# Patient Record
Sex: Male | Born: 1945
Health system: Southern US, Community
[De-identification: ages and names within clinical notes are randomized; demographics above are authoritative.]

## PROBLEM LIST (undated history)

## (undated) DIAGNOSIS — K219 Gastro-esophageal reflux disease without esophagitis: Secondary | ICD-10-CM

## (undated) DIAGNOSIS — Z87442 Personal history of urinary calculi: Secondary | ICD-10-CM

## (undated) DIAGNOSIS — E785 Hyperlipidemia, unspecified: Secondary | ICD-10-CM

## (undated) DIAGNOSIS — Z720 Tobacco use: Secondary | ICD-10-CM

## (undated) DIAGNOSIS — J45909 Unspecified asthma, uncomplicated: Secondary | ICD-10-CM

## (undated) DIAGNOSIS — I2510 Atherosclerotic heart disease of native coronary artery without angina pectoris: Secondary | ICD-10-CM

## (undated) HISTORY — PX: UMBILICAL HERNIA REPAIR: SHX196

## (undated) HISTORY — DX: Atherosclerotic heart disease of native coronary artery without angina pectoris: I25.10

## (undated) HISTORY — PX: KIDNEY STONE SURGERY: SHX686

## (undated) HISTORY — DX: Tobacco use: Z72.0

## (undated) HISTORY — DX: Hyperlipidemia, unspecified: E78.5

---

## 1990-05-29 HISTORY — PX: CORONARY ANGIOPLASTY: SHX604

## 1990-06-28 HISTORY — PX: CORONARY ANGIOPLASTY: SHX604

## 1999-12-03 ENCOUNTER — Encounter: Payer: Self-pay | Admitting: Emergency Medicine

## 1999-12-03 ENCOUNTER — Emergency Department (HOSPITAL_COMMUNITY): Admission: EM | Admit: 1999-12-03 | Discharge: 1999-12-03 | Payer: Self-pay | Admitting: Emergency Medicine

## 2001-04-25 ENCOUNTER — Emergency Department (HOSPITAL_COMMUNITY): Admission: EM | Admit: 2001-04-25 | Discharge: 2001-04-26 | Payer: Self-pay | Admitting: *Deleted

## 2001-04-25 ENCOUNTER — Encounter: Payer: Self-pay | Admitting: Emergency Medicine

## 2001-07-14 ENCOUNTER — Ambulatory Visit (HOSPITAL_COMMUNITY): Admission: RE | Admit: 2001-07-14 | Discharge: 2001-07-14 | Payer: Self-pay | Admitting: Gastroenterology

## 2001-11-03 ENCOUNTER — Ambulatory Visit (HOSPITAL_COMMUNITY): Admission: RE | Admit: 2001-11-03 | Discharge: 2001-11-03 | Payer: Self-pay | Admitting: Gastroenterology

## 2006-01-20 ENCOUNTER — Encounter: Admission: RE | Admit: 2006-01-20 | Discharge: 2006-01-20 | Payer: Self-pay | Admitting: Surgery

## 2006-01-22 ENCOUNTER — Ambulatory Visit (HOSPITAL_BASED_OUTPATIENT_CLINIC_OR_DEPARTMENT_OTHER): Admission: RE | Admit: 2006-01-22 | Discharge: 2006-01-22 | Payer: Self-pay | Admitting: Surgery

## 2009-11-06 ENCOUNTER — Encounter: Admission: RE | Admit: 2009-11-06 | Discharge: 2009-11-06 | Payer: Self-pay | Admitting: Cardiology

## 2009-11-12 ENCOUNTER — Inpatient Hospital Stay (HOSPITAL_BASED_OUTPATIENT_CLINIC_OR_DEPARTMENT_OTHER): Admission: RE | Admit: 2009-11-12 | Discharge: 2009-11-12 | Payer: Self-pay | Admitting: Cardiology

## 2010-05-27 ENCOUNTER — Ambulatory Visit: Payer: Self-pay | Admitting: Cardiology

## 2010-12-09 ENCOUNTER — Ambulatory Visit: Payer: Self-pay | Admitting: Cardiology

## 2010-12-19 LAB — POCT I-STAT GLUCOSE
Glucose, Bld: 167 mg/dL — ABNORMAL HIGH (ref 70–99)
Operator id: 221371

## 2011-01-01 ENCOUNTER — Other Ambulatory Visit: Payer: Self-pay | Admitting: Cardiology

## 2011-01-01 DIAGNOSIS — I2510 Atherosclerotic heart disease of native coronary artery without angina pectoris: Secondary | ICD-10-CM

## 2011-01-01 MED ORDER — RANOLAZINE ER 500 MG PO TB12: 500.0000 mg | ORAL_TABLET | Freq: Every day | ORAL | Status: DC

## 2011-01-01 NOTE — Telephone Encounter (Signed)
Fax received from pharmacy. Dr Swaziland consulted to verify dose and auth as ordered. Alfonso Ramus RN

## 2011-01-05 ENCOUNTER — Other Ambulatory Visit: Payer: Self-pay | Admitting: Cardiology

## 2011-01-05 ENCOUNTER — Other Ambulatory Visit: Payer: Self-pay | Admitting: *Deleted

## 2011-01-05 DIAGNOSIS — I209 Angina pectoris, unspecified: Secondary | ICD-10-CM

## 2011-01-05 DIAGNOSIS — I2510 Atherosclerotic heart disease of native coronary artery without angina pectoris: Secondary | ICD-10-CM

## 2011-01-05 MED ORDER — RANOLAZINE ER 500 MG PO TB12: 500.0000 mg | ORAL_TABLET | Freq: Every day | ORAL | Status: DC

## 2011-01-05 MED ORDER — RANOLAZINE ER 500 MG PO TB12: 500.0000 mg | ORAL_TABLET | Freq: Two times a day (BID) | ORAL | Status: DC

## 2011-01-05 NOTE — Telephone Encounter (Signed)
Ranexa 1000mg  refill. [facility] pharmacy called several times but office has not responded. Call back [facility] [place] [phone])

## 2011-01-05 NOTE — Telephone Encounter (Signed)
escribe medication per fax request  

## 2011-01-12 ENCOUNTER — Ambulatory Visit: Payer: Self-pay | Admitting: Cardiology

## 2011-01-22 ENCOUNTER — Encounter: Payer: Self-pay | Admitting: Cardiology

## 2011-01-22 DIAGNOSIS — I2510 Atherosclerotic heart disease of native coronary artery without angina pectoris: Secondary | ICD-10-CM | POA: Insufficient documentation

## 2011-01-22 DIAGNOSIS — E785 Hyperlipidemia, unspecified: Secondary | ICD-10-CM | POA: Insufficient documentation

## 2011-01-22 DIAGNOSIS — N200 Calculus of kidney: Secondary | ICD-10-CM | POA: Insufficient documentation

## 2011-01-22 DIAGNOSIS — E119 Type 2 diabetes mellitus without complications: Secondary | ICD-10-CM | POA: Insufficient documentation

## 2011-01-22 DIAGNOSIS — Z720 Tobacco use: Secondary | ICD-10-CM | POA: Insufficient documentation

## 2011-01-28 ENCOUNTER — Ambulatory Visit (INDEPENDENT_AMBULATORY_CARE_PROVIDER_SITE_OTHER): Payer: Medicare Other | Admitting: Cardiology

## 2011-01-28 ENCOUNTER — Encounter: Payer: Self-pay | Admitting: Cardiology

## 2011-01-28 DIAGNOSIS — E7800 Pure hypercholesterolemia, unspecified: Secondary | ICD-10-CM

## 2011-01-28 DIAGNOSIS — Z720 Tobacco use: Secondary | ICD-10-CM

## 2011-01-28 DIAGNOSIS — E785 Hyperlipidemia, unspecified: Secondary | ICD-10-CM

## 2011-01-28 DIAGNOSIS — F17200 Nicotine dependence, unspecified, uncomplicated: Secondary | ICD-10-CM

## 2011-01-28 DIAGNOSIS — I2510 Atherosclerotic heart disease of native coronary artery without angina pectoris: Secondary | ICD-10-CM

## 2011-01-28 MED ORDER — ATORVASTATIN CALCIUM 80 MG PO TABS
80.0000 mg | ORAL_TABLET | Freq: Every day | ORAL | Status: DC
Start: 2011-01-28 — End: 2014-07-05

## 2011-01-28 MED ORDER — NITROGLYCERIN 0.4 MG SL SUBL: 0.4000 mg | SUBLINGUAL_TABLET | SUBLINGUAL | Status: DC | PRN

## 2011-01-28 NOTE — Assessment & Plan Note (Signed)
I will request a copy of his most recent blood work. Since Vytorin is too expensive we will switch him to Lipitor 80 mg daily.

## 2011-01-28 NOTE — Assessment & Plan Note (Signed)
I have counseled him on the need for smoking cessation. He is not interested in quitting at this time.

## 2011-01-28 NOTE — Assessment & Plan Note (Signed)
We will continue medical therapy. His angina is well controlled. He needs to continue on risk factor modification including smoking cessation, lipid control, and weight loss.

## 2011-01-28 NOTE — Progress Notes (Signed)
Benjamin [name] Date of Birth: 1946/02/20   History of Present Illness: Benjamin [name] is seen for followup today. He states he is doing very well. He reports that he had a screening ultrasound exam last month and that everything checked out okay. He complains that he is unable to afford the Vytorin. He reports that Dr. Clovis Riley does blood work on him every 6 months. He denies any significant chest pain or shortness of breath. He's had no palpitations.  Current Outpatient Prescriptions on File Prior to Visit  Medication Sig Dispense Refill  . aspirin 325 MG tablet Take 325 mg by mouth daily.        Marland Kitchen atenolol (TENORMIN) 25 MG tablet Take 25 mg by mouth daily.        Marland Kitchen glimepiride (AMARYL) 4 MG tablet Take 4 mg by mouth daily before breakfast.        . isosorbide mononitrate (IMDUR) 60 MG 24 hr tablet Take 60 mg by mouth daily.        Marland Kitchen lisinopril (PRINIVIL,ZESTRIL) 20 MG tablet Take 20 mg by mouth daily.        . metFORMIN (GLUMETZA) 1000 MG (MOD) 24 hr tablet Take 1,000 mg by mouth 2 (two) times daily with a meal.        . ranolazine (RANEXA) 500 MG 12 hr tablet Take 1 tablet (500 mg total) by mouth daily.  30 tablet  5  . VITAMIN A PO Take by mouth daily.        Marland Kitchen VITAMIN E PO Take by mouth daily.        Marland Kitchen DISCONTD: ezetimibe-simvastatin (VYTORIN) 10-80 MG per tablet Take 1 tablet by mouth at bedtime.        Marland Kitchen DISCONTD: nitroGLYCERIN (NITROSTAT) 0.4 MG SL tablet Place 0.4 mg under the tongue every 5 (five) minutes as needed.         Allergies  Allergen Reactions  . Sulfa Drugs Cross Reactors     Past Medical History  Diagnosis Date  . Coronary artery disease   . Diabetes mellitus     TYPE 2  . Hyperlipidemia   . Kidney stones   . Tobacco abuse     Past Surgical History  Procedure Date  . Coronary angioplasty 05/1990    PROXIMAL LAD  . Coronary angioplasty 06/1990    LEFT CIRCUMFLEX CORONARY  . Umbilical hernia repair     History  Smoking status  . Current Everyday Smoker  -- 1.0 packs/day for 54 years  . Types: Cigarettes  Smokeless tobacco  . Never Used    History  Alcohol Use  . Yes    every other week    Family History  Problem Relation Age of Onset  . Heart disease Sister     Review of Systems: The review of systems is positive for reduced alcohol intake.  He states he now only drinks when [place] which is only once every couple of weeks. He continues to smoke.All other systems were reviewed and are negative.  Physical Exam: BP 100/60  Pulse 76  Ht 5\' 5"  (1.651 m)  Wt 159 lb 9 oz (72.377 kg)  BMI 26.55 kg/m2 He is a pleasant white male in no acute distress. HEENT exam is unremarkable. No JVD or bruits. Lungs are clear. Cardiac exam reveals a regular rate and rhythm without gallop, murmur, or click. He has good pedal pulses. He has no edema. Neurologic exam is nonfocal.  LABORATORY DATA:  Assessment / Plan:

## 2011-01-28 NOTE — Patient Instructions (Addendum)
We will get a copy of your lab work from Dr. Clovis Riley  We will substitute Lipitor 80 mg daily for Vytorin.  Stay active and try and lose weight.  Stop smoking.  We will see you back in 6 months.

## 2011-02-13 NOTE — Procedures (Signed)
[place]. [facility]  Patient:    Benjamin [name], Benjamin [name]. Visit Number: [id number] MRN: [id number]          Service Type: Attending:  Verlin Grills, M.D. Dictated by:   Verlin Grills, M.D. Proc. Date: 11/03/01   CC:         Lupe Carney, M.D.   Procedure Report  REFERRING PHYSICIAN:  Lupe Carney, M.D.  PROCEDURE INDICATION: Mr. Benjamin [name] is a 65 year old male born December 29, 1945.  Mr. Benjamin [name] is referred to me for diagnostic colonoscopy to evaluate guaiac-positive stool.  I discussed with Mr. Benjamin [name] the complications associated with colonoscopy and polypectomy, including a 15 per 1,000 risk of bleeding and 4 per 1,000 risk of colonic perforation requiring surgical repair.  Mr. Benjamin [name] has signed the operative permit.  ENDOSCOPIST:  Verlin Grills, M.D.  PREMEDICATION:  Versed 7.5 mg, fentanyl 50 mcg.  ENDOSCOPE:  Olympus pediatric colonoscope.  PROCEDURE:  After obtaining informed consent, Mr. Benjamin [name] was placed in the left lateral decubitus position.  I administered intravenous fentanyl and intravenous Versed to achieve conscious sedation before the procedure.  The patients blood pressure, oxygen saturation, and cardiac rhythm were monitored throughout the procedure and documented in the medical record.  Anal inspection was normal.  Digital rectal exam revealed a small, non-nodular prostate.  The Olympus pediatric video colonoscope was introduced into the rectum and easily advanced to the cecum.  The normal-appearing ileocecal valve was intubated and the distal ileum inspected.  Colonic preparation for the exam today was excellent.  Rectum: Normal.  Sigmoid and descending colon:  Normal.  Splenic flexure:  Normal.  Transverse colon:  Normal.  Hepatic flexure:  Normal.  Ascending colon:  Normal.  Cecum and ileocecal valve:  Normal.  Distal ileum:  Normal.  ASSESSMENT: Normal proctocolonoscopy to the cecum  with intubation of a normal-appearing ileocecal valve and distal ileal inspection.  No endoscopic evidence for the presence of colorectal neoplasia or inflammatory bowel disease. Dictated by:   Verlin Grills, M.D. Attending:  Verlin Grills, M.D. DD:  11/03/01 TD:  11/04/01 Job: [id number]

## 2011-02-13 NOTE — Op Note (Signed)
NAME:  Benjamin [name], Benjamin [name] NO.:  [id number]   MEDICAL RECORD NO.:  [id number]          PATIENT TYPE:  AMB   LOCATION:  [facility]                         FACILITY:  [facility]   PHYSICIAN:  Thomas A. Cornett, M.D.DATE OF BIRTH:  Jun 28, 1946   DATE OF PROCEDURE:  01/22/2006  DATE OF DISCHARGE:                                 OPERATIVE REPORT   PREOPERATIVE DIAGNOSIS:  Umbilical hernia.   POSTOPERATIVE DIAGNOSIS:  Umbilical hernia.   PROCEDURE:  Umbilical hernia repair.   SURGEON:  Maisie Fus A. Cornett, M.D.   ANESTHESIA:  LMA with 9 cc of 0.5% Sensorcaine local with epinephrine.   ESTIMATED BLOOD LOSS:  Less than 10 cc.   DRAINS:  None.   SPECIMENS:  None.   INDICATIONS FOR PROCEDURE:  The patient is a 65 year old male with a  symptomatic umbilical hernia.  He wished to have this repaired.  I discussed  the procedure with him at length, especially any complications of the  procedure, which include bleeding, infection, injury to internal organs, and  recurrence as the most common complications.  He understood the above and  agreed to proceed.   DESCRIPTION OF PROCEDURE:  The patient is brought to the operating room and  placed supine.  After induction of LMA anesthesia, the periumbilical region  was prepped and draped in a sterile fashion.  An infraumbilical incision was  made after infiltrating 0.5% Bupivacaine around the umbilicus.  The incision  was carried below the umbilicus, where I encountered the hernia sac and  dissected it off the under-surface of the skin at the umbilicus.  This had  nothing in it, and this was all preperitoneal contents.  I reduced this back  into the space below the fascia.  I did not penetrate the peritoneal lining  into the abdominal cavity.  The defect measured less than 1 cm in size, and  I opted to repair it with two interrupted 0 Novofil sutures.  This worked  quite nicely and closed the defect.  I then tacked the umbilicus down to the  fascia with a 3-0 Vicryl, and 4-0  Monocryl was then used to close the skin.  All final counts of sponge,  needle, and instruments were found to be correct at this portion of the  case.  The patient was awakened and taken to recovery in satisfactory  condition.      Thomas A. Cornett, M.D.  Electronically Signed     TAC/MEDQ  D:  01/22/2006  T:  01/22/2006  Job:  [id number]   cc:   L. Lupe Carney, M.D.  Fax: [phone]

## 2011-07-22 ENCOUNTER — Encounter: Payer: Self-pay | Admitting: Nurse Practitioner

## 2011-07-22 ENCOUNTER — Ambulatory Visit (INDEPENDENT_AMBULATORY_CARE_PROVIDER_SITE_OTHER): Payer: Medicare Other | Admitting: Nurse Practitioner

## 2011-07-22 VITALS — BP 98/58 | HR 88 | Ht 65.0 in | Wt 157.0 lb

## 2011-07-22 DIAGNOSIS — I2510 Atherosclerotic heart disease of native coronary artery without angina pectoris: Secondary | ICD-10-CM

## 2011-07-22 DIAGNOSIS — E785 Hyperlipidemia, unspecified: Secondary | ICD-10-CM

## 2011-07-22 DIAGNOSIS — Z720 Tobacco use: Secondary | ICD-10-CM

## 2011-07-22 DIAGNOSIS — F17200 Nicotine dependence, unspecified, uncomplicated: Secondary | ICD-10-CM

## 2011-07-22 NOTE — Assessment & Plan Note (Signed)
He is doing well clinically. No change in his medicines. Will see him back in 6 months. Smoking cessation is encouraged once again. Patient is agreeable to this plan and will call if any problems develop in the interim.

## 2011-07-22 NOTE — Assessment & Plan Note (Signed)
Now on Lipitor. Was not able to afford Vytorin. Has had labs drawn at his PCP's earlier today.

## 2011-07-22 NOTE — Patient Instructions (Signed)
Stay on your current medicines  Smoking cessation is encouraged.  We will see you back in 6 months.  Call for any problems.

## 2011-07-22 NOTE — Assessment & Plan Note (Addendum)
Smoking cessation is encouraged. He is not interested in stopping.

## 2011-07-22 NOTE — Progress Notes (Signed)
Benjamin [name] Date of Birth: Aug 20, 1946 Medical Record #[id number]  History of Present Illness: Benjamin [name] is seen today for his follow up visit. He is seen for Dr. Swaziland. He has known CAD and diabetes with ongoing tobacco use. [place]probably too much[place] He is not interested in quitting. Last myoview in January of 2011. He says he is doing well. No chest pain or shortness of breath. He is not lightheaded or dizzy. No palpitations. He has had his labs drawn earlier this morning. He has no complaint.  Current Outpatient Prescriptions on File Prior to Visit  Medication Sig Dispense Refill  . aspirin 325 MG tablet Take 325 mg by mouth daily.        Marland Kitchen atenolol (TENORMIN) 25 MG tablet Take 25 mg by mouth daily.        Marland Kitchen atorvastatin (LIPITOR) 80 MG tablet Take 1 tablet (80 mg total) by mouth daily.  90 tablet  3  . glimepiride (AMARYL) 4 MG tablet Take 4 mg by mouth daily before breakfast.        . isosorbide mononitrate (IMDUR) 60 MG 24 hr tablet Take 60 mg by mouth daily.        Marland Kitchen lisinopril (PRINIVIL,ZESTRIL) 20 MG tablet Take 20 mg by mouth daily.        . metFORMIN (GLUMETZA) 1000 MG (MOD) 24 hr tablet Take 1,000 mg by mouth 2 (two) times daily with a meal.        . nitroGLYCERIN (NITROSTAT) 0.4 MG SL tablet Place 1 tablet (0.4 mg total) under the tongue every 5 (five) minutes as needed.  25 tablet  12  . ranolazine (RANEXA) 500 MG 12 hr tablet Take 1 tablet (500 mg total) by mouth daily.  30 tablet  5    Allergies  Allergen Reactions  . Sulfa Drugs Cross Reactors     Past Medical History  Diagnosis Date  . Coronary artery disease   . Diabetes mellitus     TYPE 2  . Hyperlipidemia   . Kidney stones   . Tobacco abuse     Past Surgical History  Procedure Date  . Coronary angioplasty 05/1990    PROXIMAL LAD  . Coronary angioplasty 06/1990    LEFT CIRCUMFLEX CORONARY  . Umbilical hernia repair     History  Smoking status  . Current Everyday  Smoker -- 1.0 packs/day for 54 years  . Types: Cigarettes  Smokeless tobacco  . Never Used    History  Alcohol Use  . Yes    every other week    Family History  Problem Relation Age of Onset  . Heart disease Sister     had open heart surgery    Review of Systems: The review of systems is positive for mild joint pain. He says he is tolerating his medicines.  All other systems were reviewed and are negative.  Physical Exam: BP 98/58  Pulse 88  Ht 5\' 5"  (1.651 m)  Wt 157 lb (71.215 kg)  BMI 26.13 kg/m2 Patient is very pleasant and in no acute distress. He smells of tobacco. Skin is warm and dry. Color is normal.  HEENT is unremarkable. Normocephalic/atraumatic. PERRL. Sclera are nonicteric. Neck is supple. No masses. No JVD. Lungs are clear. Cardiac exam shows a regular rate and rhythm. He does have some occasional ectopics. Abdomen is soft. Extremities are without edema. Gait and ROM are intact. No gross neurologic deficits noted.  LABORATORY DATA: EKG shows sinus with nonspecific ST changes.   Assessment / Plan:

## 2011-07-28 ENCOUNTER — Encounter: Payer: Self-pay | Admitting: Cardiology

## 2011-12-02 ENCOUNTER — Telehealth: Payer: Self-pay | Admitting: Cardiology

## 2011-12-02 NOTE — Telephone Encounter (Signed)
Pt uses [facility], pt on renexa and ins will not longer cover it, can he change to something else? pls call

## 2011-12-02 NOTE — Telephone Encounter (Signed)
Patient called stating insurance has denied ranexa.Ranexa drug assistance program was called [phone] will be faxing application for free ranexa.

## 2011-12-17 ENCOUNTER — Telehealth: Payer: Self-pay | Admitting: Cardiology

## 2011-12-17 NOTE — Telephone Encounter (Signed)
Please return call to patient assistance program case manager   Roddie Mc- Ranexa [facility] [phone]  Please contact Roddie Mc concerning prior authorization for RX Ranexa, she can be reached at [phone]

## 2011-12-18 NOTE — Telephone Encounter (Signed)
Benjamin [name] from ranexa connect called no answer.LMTC.

## 2011-12-21 NOTE — Telephone Encounter (Signed)
FU Call: Pt returning call to [facility]. Please return pt call to discuss further.

## 2011-12-21 NOTE — Telephone Encounter (Signed)
Patient called, stated he has not been approved for ranexa yet .Ranexa connect called, they will fax re application form for Dr.Signature.

## 2011-12-24 NOTE — Telephone Encounter (Signed)
Re application form was filled out and faxed [phone].

## 2012-01-18 ENCOUNTER — Ambulatory Visit (INDEPENDENT_AMBULATORY_CARE_PROVIDER_SITE_OTHER): Payer: Medicare Other | Admitting: Cardiology

## 2012-01-18 ENCOUNTER — Encounter: Payer: Self-pay | Admitting: Cardiology

## 2012-01-18 VITALS — BP 100/60 | HR 56 | Ht 65.0 in | Wt 156.0 lb

## 2012-01-18 DIAGNOSIS — Z720 Tobacco use: Secondary | ICD-10-CM

## 2012-01-18 DIAGNOSIS — E119 Type 2 diabetes mellitus without complications: Secondary | ICD-10-CM

## 2012-01-18 DIAGNOSIS — I2510 Atherosclerotic heart disease of native coronary artery without angina pectoris: Secondary | ICD-10-CM

## 2012-01-18 DIAGNOSIS — F17200 Nicotine dependence, unspecified, uncomplicated: Secondary | ICD-10-CM

## 2012-01-18 NOTE — Assessment & Plan Note (Signed)
His chronic angina is consistent with his known coronary disease and occlusion of the right coronary with collaterals. We will continue his medical management with aspirin, beta blocker, isosorbide, and Ranexa. He needs to focus more risk factor modification with smoking cessation.

## 2012-01-18 NOTE — Progress Notes (Signed)
Benjamin [name] Date of Birth: [birth year] Medical Record #[id number]  History of Present Illness: Benjamin [name] is seen today for his follow up visit. He has been under a lot of stress recently as his wife was recently admitted with serious cardiac disease. He states he's not been eating as well and has lost some weight. He is trying to smoke less but continues to smoke at least one half pack per day. He does note that when he does something strenuous he gets a burning sensation in his chest that is resolved with rest. He has not used any nitroglycerin.  Current Outpatient Prescriptions on File Prior to Visit  Medication Sig Dispense Refill  . aspirin 325 MG tablet Take 325 mg by mouth daily.        Marland Kitchen atenolol (TENORMIN) 25 MG tablet Take 25 mg by mouth daily.        Marland Kitchen atorvastatin (LIPITOR) 80 MG tablet Take 1 tablet (80 mg total) by mouth daily.  90 tablet  3  . glimepiride (AMARYL) 4 MG tablet Take 4 mg by mouth daily before breakfast.        . isosorbide mononitrate (IMDUR) 60 MG 24 hr tablet Take 60 mg by mouth daily.        Marland Kitchen lisinopril (PRINIVIL,ZESTRIL) 20 MG tablet Take 20 mg by mouth daily.        . metFORMIN (GLUMETZA) 1000 MG (MOD) 24 hr tablet Take 1,000 mg by mouth 2 (two) times daily with a meal.        . Multiple Vitamin (MULTIVITAMIN) tablet Take 1 tablet by mouth daily.        . nitroGLYCERIN (NITROSTAT) 0.4 MG SL tablet Place 1 tablet (0.4 mg total) under the tongue every 5 (five) minutes as needed.  25 tablet  12  . ranolazine (RANEXA) 500 MG 12 hr tablet Take 1 tablet (500 mg total) by mouth daily.  30 tablet  5    Allergies  Allergen Reactions  . Sulfa Drugs Cross Reactors     Past Medical History  Diagnosis Date  . Coronary artery disease     Prior PCI to the prox LAD in 1991; S/P PCI to LCX in October 1991  . Diabetes mellitus     TYPE 2  . Hyperlipidemia   . Kidney stones   . Tobacco abuse     Past Surgical History  Procedure Date  . Coronary angioplasty  05/1990    PROXIMAL LAD  . Coronary angioplasty 06/1990    LEFT CIRCUMFLEX CORONARY  . Umbilical hernia repair     History  Smoking status  . Current Everyday Smoker -- 1.0 packs/day for 54 years  . Types: Cigarettes  Smokeless tobacco  . Never Used    History  Alcohol Use  . Yes    every other week    Family History  Problem Relation Age of Onset  . Heart disease Sister     had open heart surgery    Review of Systems: The review of systems is positive for chronic angina as noted.  All other systems were reviewed and are negative.  Physical Exam: BP 100/60  Pulse 56  Ht 5\' 5"  (1.651 m)  Wt 156 lb (70.761 kg)  BMI 25.96 kg/m2 Patient is very pleasant and in no acute distress. Skin is warm and dry. Color is normal.  HEENT is unremarkable. Normocephalic/atraumatic. PERRL. Sclera are nonicteric. Neck is supple. No masses. No JVD. Lungs are clear. Cardiac exam shows  a regular rate and rhythm. He does have some occasional ectopics. Abdomen is soft. There are no masses or bruits. Extremities are without edema. Gait and ROM are intact. No gross neurologic deficits noted.   LABORATORY DATA:    Assessment / Plan:

## 2012-01-18 NOTE — Assessment & Plan Note (Signed)
Discussed smoking cessation strategies. He is trying to cut back.

## 2012-01-18 NOTE — Assessment & Plan Note (Signed)
Last recorded A1c was 7.1%. Continue with weight control and current medications with Amaryl and metformin.

## 2012-01-18 NOTE — Patient Instructions (Signed)
Continue your current medications.  Stop smoking.  I will see you again in 6 months.

## 2012-01-20 ENCOUNTER — Encounter: Payer: Self-pay | Admitting: Cardiology

## 2012-10-04 ENCOUNTER — Ambulatory Visit: Payer: Medicare Other | Admitting: Cardiology

## 2013-06-22 ENCOUNTER — Encounter: Payer: Self-pay | Admitting: Cardiology

## 2013-07-12 ENCOUNTER — Encounter: Payer: Self-pay | Admitting: Cardiology

## 2013-07-20 ENCOUNTER — Ambulatory Visit: Payer: Medicare Other | Admitting: Cardiology

## 2013-07-24 ENCOUNTER — Encounter (INDEPENDENT_AMBULATORY_CARE_PROVIDER_SITE_OTHER): Payer: Self-pay

## 2013-07-24 ENCOUNTER — Encounter: Payer: Self-pay | Admitting: Cardiology

## 2013-07-24 ENCOUNTER — Ambulatory Visit (INDEPENDENT_AMBULATORY_CARE_PROVIDER_SITE_OTHER): Payer: Medicare Other | Admitting: Cardiology

## 2013-07-24 VITALS — BP 100/54 | HR 79 | Ht 65.0 in | Wt 156.4 lb

## 2013-07-24 DIAGNOSIS — I2510 Atherosclerotic heart disease of native coronary artery without angina pectoris: Secondary | ICD-10-CM

## 2013-07-24 DIAGNOSIS — E785 Hyperlipidemia, unspecified: Secondary | ICD-10-CM

## 2013-07-24 MED ORDER — LISINOPRIL 20 MG PO TABS: 10.0000 mg | ORAL_TABLET | Freq: Every day | ORAL | Status: DC

## 2013-07-24 NOTE — Patient Instructions (Signed)
Continue your current therapy  I will see you in one year   

## 2013-07-24 NOTE — Progress Notes (Signed)
Jobe Jolyn Lent Date of Birth: 1945/12/31 Medical Record #[id number]  History of Present Illness: [place] is seen today for his follow up visit. He has a history of coronary disease with prior angioplasty of the proximal LAD in 1991. He also had angioplasty of the circumflex at that time. He has known occlusion of the right coronary with collateral flow. He has done very well without any symptoms of chest pain or shortness of breath. He continues to smoke. He reports his blood sugars were higher and he was started on Januvia. He denies any shortness of breath or edema. His last Myoview study in 2011 showed an area of moderate infarction in the anterior lateral wall. Otherwise no ischemia. Ejection fraction was 62%.  Current Outpatient Prescriptions on File Prior to Visit  Medication Sig Dispense Refill  . aspirin 325 MG tablet Take 325 mg by mouth daily.        Marland Kitchen atenolol (TENORMIN) 25 MG tablet Take 25 mg by mouth daily.        Marland Kitchen atorvastatin (LIPITOR) 80 MG tablet Take 1 tablet (80 mg total) by mouth daily.  90 tablet  3  . glimepiride (AMARYL) 4 MG tablet Take 4 mg by mouth daily before breakfast.        . isosorbide mononitrate (IMDUR) 60 MG 24 hr tablet Take 60 mg by mouth daily.        . metFORMIN (GLUMETZA) 1000 MG (MOD) 24 hr tablet Take 1,000 mg by mouth 2 (two) times daily with a meal.        . nitroGLYCERIN (NITROSTAT) 0.4 MG SL tablet Place 1 tablet (0.4 mg total) under the tongue every 5 (five) minutes as needed.  25 tablet  12   No current facility-administered medications on file prior to visit.    Allergies  Allergen Reactions  . Sulfa Drugs Cross Reactors     Past Medical History  Diagnosis Date  . Coronary artery disease     Prior PCI to the prox LAD in 1991; S/P PCI to LCX in October 1991  . Diabetes mellitus     TYPE 2  . Hyperlipidemia   . Kidney stones   . Tobacco abuse     Past Surgical History  Procedure Laterality Date  . Coronary angioplasty  05/1990     PROXIMAL LAD  . Coronary angioplasty  06/1990    LEFT CIRCUMFLEX CORONARY  . Umbilical hernia repair      History  Smoking status  . Current Every Day Smoker -- 1.00 packs/day for 54 years  . Types: Cigarettes  Smokeless tobacco  . Never Used    History  Alcohol Use  . Yes    Comment: every other week    Family History  Problem Relation Age of Onset  . Heart disease Sister     had open heart surgery    Review of Systems: As noted in history of present illness.  All other systems were reviewed and are negative.  Physical Exam: BP 100/54  Pulse 79  Ht 5\' 5"  (1.651 m)  Wt 156 lb 6.4 oz (70.943 kg)  BMI 26.03 kg/m2 Patient is very pleasant and in no acute distress. Skin is warm and dry. Color is normal.  HEENT is unremarkable.  Neck is supple. No masses. No JVD. Lungs are clear. Cardiac exam shows a regular rate and rhythm. He does have some occasional ectopics. Abdomen is soft. There are no masses or bruits. Extremities are without edema. Gait  and ROM are intact. No gross neurologic deficits noted.   LABORATORY DATA:  ECG today demonstrates normal sinus rhythm with frequent PACs. Otherwise only minor nonspecific ST abnormality.  Assessment / Plan: 1. Coronary disease with remote angioplasty of the LAD and left circumflex coronary is in 1991. Chronic total occlusion of the right coronary. He has stable anginal symptoms. We'll continue with aspirin, atenolol, and isosorbide.  2. Hyperlipidemia. Continue on high-dose atorvastatin.  3. Diabetes mellitus type 2.  4. Tobacco abuse. Patient counseled on smoking cessation. He is not ready to quit at this time.

## 2013-10-26 ENCOUNTER — Telehealth: Payer: Self-pay | Admitting: Cardiology

## 2013-10-26 NOTE — Telephone Encounter (Signed)
New problem   Need to speak to nurse concerning pt's medications. Please call

## 2013-10-27 MED ORDER — RANOLAZINE ER 500 MG PO TB12: ORAL_TABLET | ORAL | Status: DC

## 2013-10-27 NOTE — Telephone Encounter (Signed)
Returned call to Lucas at [place] office 10/26/13 she stated patient needed a refill on Ranexa.Patient was called he stated he is still taking Ranexa 500 mg daily.Refill sent to pharmacy.

## 2014-01-29 ENCOUNTER — Encounter: Payer: Self-pay | Admitting: Cardiology

## 2014-05-02 ENCOUNTER — Telehealth: Payer: Self-pay

## 2014-05-02 NOTE — Telephone Encounter (Signed)
Returned call to patient advised to call PCP about Januvia costing too much.

## 2014-07-05 ENCOUNTER — Encounter: Payer: Self-pay | Admitting: Cardiology

## 2014-07-05 ENCOUNTER — Ambulatory Visit (INDEPENDENT_AMBULATORY_CARE_PROVIDER_SITE_OTHER): Payer: Medicare Other | Admitting: Cardiology

## 2014-07-05 VITALS — BP 120/60 | HR 68 | Ht 64.5 in | Wt 155.9 lb

## 2014-07-05 DIAGNOSIS — E7800 Pure hypercholesterolemia, unspecified: Secondary | ICD-10-CM

## 2014-07-05 DIAGNOSIS — Z720 Tobacco use: Secondary | ICD-10-CM

## 2014-07-05 DIAGNOSIS — I25119 Atherosclerotic heart disease of native coronary artery with unspecified angina pectoris: Secondary | ICD-10-CM

## 2014-07-05 DIAGNOSIS — E785 Hyperlipidemia, unspecified: Secondary | ICD-10-CM

## 2014-07-05 MED ORDER — ATORVASTATIN CALCIUM 80 MG PO TABS: 80.0000 mg | ORAL_TABLET | Freq: Every day | ORAL | Status: DC

## 2014-07-05 MED ORDER — LISINOPRIL 20 MG PO TABS: 10.0000 mg | ORAL_TABLET | Freq: Every day | ORAL | Status: DC

## 2014-07-05 MED ORDER — ATENOLOL 25 MG PO TABS: 25.0000 mg | ORAL_TABLET | Freq: Every day | ORAL | Status: DC

## 2014-07-05 MED ORDER — RANOLAZINE ER 500 MG PO TB12: ORAL_TABLET | ORAL | Status: DC

## 2014-07-05 MED ORDER — ISOSORBIDE MONONITRATE ER 60 MG PO TB24: 60.0000 mg | ORAL_TABLET | Freq: Every day | ORAL | Status: DC

## 2014-07-05 NOTE — Patient Instructions (Addendum)
You need to eliminate sweets in your diet and exercise more.   Continue your current medication  We will schedule you for a nuclear stress test.  I will see you in one year.

## 2014-07-05 NOTE — Progress Notes (Signed)
Vivian De Nurse Date of Birth: 03/01/1946 Medical Record #[id number]  History of Present Illness: Benjamin [name] is seen today for his follow up visit. He has a history of coronary disease with prior angioplasty of the proximal LAD in 1991. He also had angioplasty of the circumflex at that time. He has known occlusion of the right coronary with collateral flow. He has done very well without any symptoms of chest pain or shortness of breath. He continues to smoke. He is not really interested in quitting. He reports his blood sugars were higher even on Januvia. Last A1c was 8.0. He denies any shortness of breath or edema. His last Myoview study in 2011 showed an area of moderate infarction in the anterior lateral wall. Otherwise no ischemia. Ejection fraction was 62%. He admits to eating too many sweets. He is very sedentary.  Current Outpatient Prescriptions on File Prior to Visit  Medication Sig Dispense Refill  . aspirin 325 MG tablet Take 325 mg by mouth daily.        Marland Kitchen glimepiride (AMARYL) 4 MG tablet Take 4 mg by mouth daily before breakfast.        . JANUVIA 100 MG tablet       . metFORMIN (GLUMETZA) 1000 MG (MOD) 24 hr tablet Take 1,000 mg by mouth 2 (two) times daily with a meal.        . nitroGLYCERIN (NITROSTAT) 0.4 MG SL tablet Place 1 tablet (0.4 mg total) under the tongue every 5 (five) minutes as needed.  25 tablet  12   No current facility-administered medications on file prior to visit.    Allergies  Allergen Reactions  . Sulfa Drugs Cross Reactors     Past Medical History  Diagnosis Date  . Coronary artery disease     Prior PCI to the prox LAD in 1991; S/P PCI to LCX in October 1991  . Diabetes mellitus     TYPE 2  . Hyperlipidemia   . Kidney stones   . Tobacco abuse     Past Surgical History  Procedure Laterality Date  . Coronary angioplasty  05/1990    PROXIMAL LAD  . Coronary angioplasty  06/1990    LEFT CIRCUMFLEX CORONARY  . Umbilical hernia repair       History  Smoking status  . Current Every Day Smoker -- 1.00 packs/day for 54 years  . Types: Cigarettes  Smokeless tobacco  . Never Used    History  Alcohol Use  . Yes    Comment: every other week    Family History  Problem Relation Age of Onset  . Heart disease Sister     had open heart surgery    Review of Systems: As noted in history of present illness.  All other systems were reviewed and are negative.  Physical Exam: BP 120/60  Pulse 68  Ht 5' 4.5" (1.638 m)  Wt 155 lb 14.4 oz (70.716 kg)  BMI 26.36 kg/m2 Patient is very pleasant and in no acute distress. Skin is warm and dry. Color is normal.  HEENT is unremarkable.  Neck is supple. No masses. No JVD. Lungs are clear. Cardiac exam shows a regular rate and rhythm. He does have some occasional ectopics. Abdomen is soft, obese. There are no masses or bruits. Extremities are without edema. Gait and ROM are intact. No gross neurologic deficits noted.   LABORATORY DATA:  ECG today demonstrates normal sinus rhythm with sinus arrhythmia. Rate 68 bpm. Otherwise only minor nonspecific ST  abnormality.  Assessment / Plan: 1. Coronary disease with remote angioplasty of the LAD and left circumflex coronary is in 1991. Chronic total occlusion of the right coronary. He has stable anginal symptoms. We'll continue with aspirin, atenolol, and isosorbide. I have recommended a follow up [place].   2. Hyperlipidemia. Continue on high-dose atorvastatin.  3. Diabetes mellitus type 2. Instructed on cutting out sweets and reducing simple carbohydrates in diet. Needs to increase aerobic activity.  4. Tobacco abuse. Patient counseled on smoking cessation. He is not ready to quit at this time.

## 2014-07-12 ENCOUNTER — Telehealth (HOSPITAL_COMMUNITY): Payer: Self-pay

## 2014-07-12 NOTE — Telephone Encounter (Signed)
Encounter complete. 

## 2014-07-17 ENCOUNTER — Ambulatory Visit (HOSPITAL_COMMUNITY)
Admission: RE | Admit: 2014-07-17 | Discharge: 2014-07-17 | Disposition: A | Discharge status: Home / Self Care | Payer: Medicare Other | Source: Ambulatory Visit | Attending: Cardiovascular Disease | Admitting: Cardiovascular Disease

## 2014-07-17 DIAGNOSIS — I25119 Atherosclerotic heart disease of native coronary artery with unspecified angina pectoris: Secondary | ICD-10-CM

## 2014-07-17 DIAGNOSIS — F17210 Nicotine dependence, cigarettes, uncomplicated: Secondary | ICD-10-CM | POA: Diagnosis not present

## 2014-07-17 DIAGNOSIS — E785 Hyperlipidemia, unspecified: Secondary | ICD-10-CM | POA: Diagnosis not present

## 2014-07-17 DIAGNOSIS — R42 Dizziness and giddiness: Secondary | ICD-10-CM | POA: Insufficient documentation

## 2014-07-17 DIAGNOSIS — E7800 Pure hypercholesterolemia, unspecified: Secondary | ICD-10-CM

## 2014-07-17 DIAGNOSIS — Z720 Tobacco use: Secondary | ICD-10-CM

## 2014-07-17 DIAGNOSIS — E119 Type 2 diabetes mellitus without complications: Secondary | ICD-10-CM | POA: Diagnosis not present

## 2014-07-17 DIAGNOSIS — R0609 Other forms of dyspnea: Secondary | ICD-10-CM | POA: Diagnosis not present

## 2014-07-17 MED ORDER — TECHNETIUM TC 99M SESTAMIBI GENERIC - CARDIOLITE
10.1000 | Freq: Once | INTRAVENOUS | Status: AC | PRN
  Administered 2014-07-17: 10.1 via INTRAVENOUS

## 2014-07-17 MED ORDER — TECHNETIUM TC 99M SESTAMIBI GENERIC - CARDIOLITE
29.8000 | Freq: Once | INTRAVENOUS | Status: AC | PRN
  Administered 2014-07-17: 29.8 via INTRAVENOUS

## 2014-07-17 MED ORDER — REGADENOSON 0.4 MG/5ML IV SOLN
0.4000 mg | Freq: Once | INTRAVENOUS | Status: AC
  Administered 2014-07-17: 0.4 mg via INTRAVENOUS

## 2014-07-17 NOTE — Procedures (Addendum)
[facility] [address] [place] [address] [phone]  Cardiology Nuclear Med Study  Benjamin [name] is a 68 y.o. male     MRN : [id number]     DOB: [birth year]  Procedure Date: 07/17/2014  Nuclear Med Background Indication for Stress Test:  Stent Patency History:  CAD;MI-1991;STENT/PTCA-06/2000;Last NUC MPI on 10/28/2009-ischemia;EF =62% Cardiac Risk Factors: Family History - CAD, Lipids, NIDDM and Smoker  Symptoms:  Dizziness, DOE, Fatigue and Light-Headedness   Nuclear Pre-Procedure Caffeine/Decaff Intake:  1:00am NPO After: 11am   IV Site: R Forearm  IV 0.9% NS with Angio Cath:  22g  Chest Size (in):  40"  IV Started by: Rolene Course, RN  Height: 5\' 5"  (1.651 m)  Cup Size: n/a  BMI:  Body mass index is 25.96 kg/(m^2). Weight:  156 lb (70.761 kg)   Tech Comments:  n/a    Nuclear Med Study 1 or 2 day study: 1 day  Stress Test Type:  [place] Provider:  Peter Martinique, MD   Resting Radionuclide: Technetium 22m Sestamibi  Resting Radionuclide Dose: 10.1 mCi   Stress Radionuclide:  Technetium 27m Sestamibi  Stress Radionuclide Dose: 29.8 mCi           Stress Protocol Rest HR: 63 Stress HR: 81  Rest BP: 116/68 Stress BP: 130/57  Exercise Time (min): n/a METS: n/a   Predicted Max HR: 157 bpm % Max HR: 54.78 bpm Rate Pressure Product: 11180  Dose of Adenosine (mg):  n/a Dose of Lexiscan: 0.4 mg  Dose of Atropine (mg): n/a Dose of Dobutamine: n/a mcg/kg/min (at max HR)  Stress Test Technologist: Leane Para, CCT Nuclear Technologist: Imagene Riches, CNMT   Rest Procedure:  Myocardial perfusion imaging was performed at rest 45 minutes following the intravenous administration of Technetium 73m Sestamibi. Stress Procedure:  The patient received IV Lexiscan 0.4 mg over 15-seconds.  Technetium 19m Sestamibi injected IV at 30-seconds.  There were no significant changes with Lexiscan.   Quantitative spect images were obtained after a 45 minute delay.  Transient Ischemic Dilatation (Normal <1.22):  1.03  QGS EDV:  n/a ml QGS ESV:  n/a ml LV Ejection Fraction: Study not gated     Rest ECG: NSR, PACs, nonspecific ST changes.  Stress ECG: No significant ST segment change suggestive of ischemia.  QPS Raw Data Images:  Acquisition technically good; normal left ventricular size. Stress Images:  There is decreased uptake in the anterior lateral wall. Rest Images:  There is decreased uptake in the anterior lateral wall. Subtraction (SDS):  No evidence of ischemia.  Impression Exercise Capacity:  Lexiscan with no exercise. BP Response:  Normal blood pressure response. Clinical Symptoms:  There is dyspnea. ECG Impression:  No significant ST segment change suggestive of ischemia. Comparison with Prior Nuclear Study: Compared to 10/28/09, the description of the defect is similar (images not available).  Overall Impression:  Low risk stress nuclear study with a small, mild intensity, fixed anterolateral defect consistent with small prior infarct; no ischemia.  LV Wall Motion:  Study not gated due to frequent PACs.   Kirk Ruths, MD  07/17/2014 4:48 PM

## 2015-02-07 ENCOUNTER — Encounter: Payer: Self-pay | Admitting: Cardiology

## 2015-06-26 ENCOUNTER — Other Ambulatory Visit: Payer: Self-pay | Admitting: Cardiology

## 2015-06-26 NOTE — Telephone Encounter (Signed)
Rx request sent to pharmacy.  

## 2015-07-22 ENCOUNTER — Other Ambulatory Visit: Payer: Self-pay | Admitting: *Deleted

## 2015-07-22 DIAGNOSIS — I25119 Atherosclerotic heart disease of native coronary artery with unspecified angina pectoris: Secondary | ICD-10-CM

## 2015-07-22 DIAGNOSIS — E7800 Pure hypercholesterolemia, unspecified: Secondary | ICD-10-CM

## 2015-07-22 MED ORDER — ATORVASTATIN CALCIUM 80 MG PO TABS: 80.0000 mg | ORAL_TABLET | Freq: Every day | ORAL | Status: DC

## 2015-07-22 MED ORDER — ISOSORBIDE MONONITRATE ER 60 MG PO TB24: 60.0000 mg | ORAL_TABLET | Freq: Every day | ORAL | Status: DC

## 2015-08-03 ENCOUNTER — Other Ambulatory Visit: Payer: Self-pay | Admitting: Cardiology

## 2015-09-02 ENCOUNTER — Ambulatory Visit (INDEPENDENT_AMBULATORY_CARE_PROVIDER_SITE_OTHER): Payer: Medicare Other | Admitting: Cardiology

## 2015-09-02 ENCOUNTER — Encounter: Payer: Self-pay | Admitting: Cardiology

## 2015-09-02 VITALS — BP 112/60 | HR 71 | Ht 65.0 in | Wt 147.1 lb

## 2015-09-02 DIAGNOSIS — I25119 Atherosclerotic heart disease of native coronary artery with unspecified angina pectoris: Secondary | ICD-10-CM | POA: Diagnosis not present

## 2015-09-02 DIAGNOSIS — Z720 Tobacco use: Secondary | ICD-10-CM

## 2015-09-02 DIAGNOSIS — E785 Hyperlipidemia, unspecified: Secondary | ICD-10-CM | POA: Diagnosis not present

## 2015-09-02 NOTE — Patient Instructions (Signed)
Continue your current therapy  Try and walk daily  I will see you in one year

## 2015-09-02 NOTE — Progress Notes (Signed)
[name] Nurse Date of Birth: [birth year] Medical Record [id number]  History of Present Illness: Benjamin [name] is seen for follow up CAD. He has a history of coronary disease with prior angioplasty of the proximal LAD in 1991. He also had angioplasty of the circumflex at that time. He has known occlusion of the right coronary with collateral flow. Last Myoview study in October 2015 showed a small anterolateral scar. No ischemia.  He has done very well without any symptoms of chest pain or shortness of breath. He continues to smoke but is smoking less. [place] is [place]  He is now on Invokana for DM with improved A1c from 9.1>>7.3%. He denies any shortness of breath or edema.  Current Outpatient Prescriptions on File Prior to Visit  Medication Sig Dispense Refill  . atenolol (TENORMIN) 25 MG tablet TAKE 1 TABLET BY MOUTH EVERY DAY 90 tablet 0  . atorvastatin (LIPITOR) 80 MG tablet Take 1 tablet (80 mg total) by mouth daily. 90 tablet 0  . glimepiride (AMARYL) 4 MG tablet Take 4 mg by mouth daily before breakfast.      . isosorbide mononitrate (IMDUR) 60 MG 24 hr tablet Take 1 tablet (60 mg total) by mouth daily. 90 tablet 0  . JANUVIA 100 MG tablet     . lisinopril (PRINIVIL,ZESTRIL) 20 MG tablet TAKE 1/2 TABLET BY MOUTH DAILY 45 tablet 0  . nitroGLYCERIN (NITROSTAT) 0.4 MG SL tablet Place 1 tablet (0.4 mg total) under the tongue every 5 (five) minutes as needed. 25 tablet 12  . RANEXA 500 MG 12 hr tablet TAKE 1 TABLET BY MOUTH EVERY DAY 30 tablet 3  . aspirin 325 MG tablet Take 325 mg by mouth daily.       No current facility-administered medications on file prior to visit.    Allergies  Allergen Reactions  . Sulfa Drugs Cross Reactors     Past Medical History  Diagnosis Date  . Coronary artery disease     Prior PCI to the prox LAD in 1991; S/P PCI to LCX in October 1991  . Diabetes mellitus     TYPE 2  . Hyperlipidemia    . Kidney stones   . Tobacco abuse     Past Surgical History  Procedure Laterality Date  . Coronary angioplasty  05/1990    PROXIMAL LAD  . Coronary angioplasty  06/1990    LEFT CIRCUMFLEX CORONARY  . Umbilical hernia repair      History  Smoking status  . Current Every Day Smoker -- 1.00 packs/day for 54 years  . Types: Cigarettes  Smokeless tobacco  . Never Used    History  Alcohol Use  . Yes    Comment: every other week    Family History  Problem Relation Age of Onset  . Heart disease Sister     had open heart surgery    Review of Systems: As noted in history of present illness.  All other systems were reviewed and are negative.  Physical Exam: BP 112/60 mmHg  Pulse 71  Ht 5\' 5"  (1.651 m)  Wt 66.735 kg (147 lb 2 oz)  BMI 24.48 kg/m2 Patient is very pleasant and in no acute distress. Skin is warm and dry. Color is normal.  HEENT is unremarkable.  Neck is supple. No masses. No JVD. Lungs are clear. Cardiac exam shows a regular rate and rhythm. He does have some occasional ectopics.  Abdomen is soft, obese. There are no masses or bruits. Extremities are without edema. Gait and ROM are intact. No gross neurologic deficits noted.   LABORATORY DATA:  ECG today demonstrates normal sinus rhythm with PACs.. Rate 71 bpm. Otherwise only minor nonspecific ST abnormality. I have personally reviewed and interpreted this study.   Assessment / Plan: 1. Coronary disease with remote angioplasty of the LAD and left circumflex coronary is in 1991. Chronic total occlusion of the right coronary. He has stable anginal symptoms. Myoview study last year was low risk. We'll continue with aspirin, atenolol, and isosorbide. Reports improvement of chest pain on Ranexa.    2. Hyperlipidemia. Continue on high-dose atorvastatin.  3. Diabetes mellitus type 2. Improved control on invokana.  4. Tobacco abuse. Patient counseled on smoking cessation. He is not ready to quit at this time.

## 2015-09-02 NOTE — Addendum Note (Signed)
Addended by: Golden Hurter D on: 09/02/2015 05:09 PM   Modules accepted: Orders

## 2015-10-22 ENCOUNTER — Other Ambulatory Visit: Payer: Self-pay | Admitting: Cardiology

## 2015-10-22 NOTE — Telephone Encounter (Signed)
Rx request sent to pharmacy.  

## 2015-10-29 ENCOUNTER — Other Ambulatory Visit: Payer: Self-pay | Admitting: Cardiology

## 2015-11-04 ENCOUNTER — Other Ambulatory Visit: Payer: Self-pay | Admitting: Cardiology

## 2016-02-20 ENCOUNTER — Other Ambulatory Visit: Payer: Self-pay | Admitting: Gastroenterology

## 2016-03-20 ENCOUNTER — Other Ambulatory Visit: Payer: Self-pay | Admitting: Family Medicine

## 2016-03-20 ENCOUNTER — Ambulatory Visit
Admission: RE | Admit: 2016-03-20 | Discharge: 2016-03-20 | Disposition: A | Discharge status: Home / Self Care | Payer: Medicare Other | Source: Ambulatory Visit | Attending: Family Medicine | Admitting: Family Medicine

## 2016-03-20 DIAGNOSIS — R059 Cough, unspecified: Secondary | ICD-10-CM

## 2016-03-20 DIAGNOSIS — R05 Cough: Secondary | ICD-10-CM

## 2016-04-16 ENCOUNTER — Encounter (HOSPITAL_COMMUNITY): Payer: Self-pay | Admitting: *Deleted

## 2016-04-21 ENCOUNTER — Encounter (HOSPITAL_COMMUNITY)
Admission: RE | Disposition: A | Discharge status: Home / Self Care | Payer: Self-pay | Source: Ambulatory Visit | Attending: Gastroenterology

## 2016-04-21 ENCOUNTER — Ambulatory Visit (HOSPITAL_COMMUNITY): Payer: Medicare Other | Admitting: Anesthesiology

## 2016-04-21 ENCOUNTER — Ambulatory Visit (HOSPITAL_COMMUNITY)
Admission: RE | Admit: 2016-04-21 | Discharge: 2016-04-21 | Disposition: A | Discharge status: Home / Self Care | Payer: Medicare Other | Source: Ambulatory Visit | Attending: Gastroenterology | Admitting: Gastroenterology

## 2016-04-21 ENCOUNTER — Encounter (HOSPITAL_COMMUNITY): Payer: Self-pay | Admitting: Anesthesiology

## 2016-04-21 DIAGNOSIS — Z1211 Encounter for screening for malignant neoplasm of colon: Secondary | ICD-10-CM | POA: Diagnosis not present

## 2016-04-21 DIAGNOSIS — Z7984 Long term (current) use of oral hypoglycemic drugs: Secondary | ICD-10-CM | POA: Insufficient documentation

## 2016-04-21 DIAGNOSIS — Z955 Presence of coronary angioplasty implant and graft: Secondary | ICD-10-CM | POA: Insufficient documentation

## 2016-04-21 DIAGNOSIS — F17200 Nicotine dependence, unspecified, uncomplicated: Secondary | ICD-10-CM | POA: Diagnosis not present

## 2016-04-21 DIAGNOSIS — I2510 Atherosclerotic heart disease of native coronary artery without angina pectoris: Secondary | ICD-10-CM | POA: Insufficient documentation

## 2016-04-21 DIAGNOSIS — K5730 Diverticulosis of large intestine without perforation or abscess without bleeding: Secondary | ICD-10-CM | POA: Diagnosis not present

## 2016-04-21 DIAGNOSIS — D122 Benign neoplasm of ascending colon: Secondary | ICD-10-CM | POA: Diagnosis not present

## 2016-04-21 DIAGNOSIS — E119 Type 2 diabetes mellitus without complications: Secondary | ICD-10-CM | POA: Diagnosis not present

## 2016-04-21 DIAGNOSIS — Z79899 Other long term (current) drug therapy: Secondary | ICD-10-CM | POA: Diagnosis not present

## 2016-04-21 DIAGNOSIS — E7800 Pure hypercholesterolemia, unspecified: Secondary | ICD-10-CM | POA: Insufficient documentation

## 2016-04-21 HISTORY — DX: Personal history of urinary calculi: Z87.442

## 2016-04-21 HISTORY — PX: COLONOSCOPY WITH PROPOFOL: SHX5780

## 2016-04-21 LAB — GLUCOSE, CAPILLARY: Glucose-Capillary: 131 mg/dL — ABNORMAL HIGH (ref 65–99)

## 2016-04-21 SURGERY — COLONOSCOPY WITH PROPOFOL
Anesthesia: Monitor Anesthesia Care

## 2016-04-21 MED ORDER — PHENYLEPHRINE HCL 10 MG/ML IJ SOLN
INTRAMUSCULAR | Status: DC | PRN
  Administered 2016-04-21: 80 ug via INTRAVENOUS

## 2016-04-21 MED ORDER — LACTATED RINGERS IV SOLN
INTRAVENOUS | Status: DC | PRN
  Administered 2016-04-21: 07:00:00 via INTRAVENOUS

## 2016-04-21 MED ORDER — SODIUM CHLORIDE 0.9 % IV SOLN: INTRAVENOUS | Status: DC

## 2016-04-21 MED ORDER — PROPOFOL 10 MG/ML IV BOLUS
INTRAVENOUS | Status: AC
  Filled 2016-04-21: qty 40

## 2016-04-21 MED ORDER — GLYCOPYRROLATE 0.2 MG/ML IJ SOLN
INTRAMUSCULAR | Status: AC
  Filled 2016-04-21: qty 1

## 2016-04-21 MED ORDER — SODIUM CHLORIDE 0.9 % IJ SOLN
INTRAMUSCULAR | Status: AC
  Filled 2016-04-21: qty 20

## 2016-04-21 MED ORDER — LACTATED RINGERS IV SOLN: INTRAVENOUS | Status: DC

## 2016-04-21 MED ORDER — PROPOFOL 500 MG/50ML IV EMUL
INTRAVENOUS | Status: DC | PRN
  Administered 2016-04-21: 30 mg via INTRAVENOUS

## 2016-04-21 MED ORDER — EPHEDRINE SULFATE 50 MG/ML IJ SOLN
INTRAMUSCULAR | Status: AC
  Filled 2016-04-21: qty 1

## 2016-04-21 MED ORDER — PROPOFOL 500 MG/50ML IV EMUL
INTRAVENOUS | Status: DC | PRN
  Administered 2016-04-21: 125 ug/kg/min via INTRAVENOUS

## 2016-04-21 MED ORDER — PROPOFOL 10 MG/ML IV BOLUS
INTRAVENOUS | Status: AC
  Filled 2016-04-21: qty 60

## 2016-04-21 SURGICAL SUPPLY — 21 items

## 2016-04-21 NOTE — Transfer of Care (Signed)
Immediate Anesthesia Transfer of Care Note  Patient: Benjamin [name]  Procedure(s) Performed: Procedure(s): COLONOSCOPY WITH PROPOFOL (N/A)  Patient Location: PACU  Anesthesia Type:MAC  Level of Consciousness:  sedated, patient cooperative and responds to stimulation  Airway & Oxygen Therapy:Patient Spontanous Breathing and Patient connected to face mask oxgen  Post-op Assessment:  Report given to PACU RN and Post -op Vital signs reviewed and stable  Post vital signs:  Reviewed and stable  Last Vitals:  Vitals:   04/21/16 0720  BP: 124/72  Pulse: 71  Resp: 11  Temp: 123456 C    Complications: No apparent anesthesia complications

## 2016-04-21 NOTE — Anesthesia Preprocedure Evaluation (Signed)
Anesthesia Evaluation  Patient identified by MRN, date of birth, ID band Patient awake    Reviewed: Allergy & Precautions, H&P , NPO status , Patient's Chart, lab work & pertinent test results, reviewed documented beta blocker date and time   Airway Mallampati: II  TM Distance: >3 FB Neck ROM: full    Dental no notable dental hx.    Pulmonary neg pulmonary ROS, Current Smoker,    Pulmonary exam normal breath sounds clear to auscultation       Cardiovascular Exercise Tolerance: Good + CAD and + Cardiac Stents  Normal cardiovascular exam Rhythm:regular Rate:Normal     Neuro/Psych negative neurological ROS  negative psych ROS   GI/Hepatic negative GI ROS, Neg liver ROS,   Endo/Other  diabetes  Renal/GU negative Renal ROS  negative genitourinary   Musculoskeletal   Abdominal   Peds  Hematology negative hematology ROS (+)   Anesthesia Other Findings   Reproductive/Obstetrics negative OB ROS                             Anesthesia Physical Anesthesia Plan  ASA: III  Anesthesia Plan: MAC   Post-op Pain Management:    Induction: Intravenous  Airway Management Planned: Simple Face Mask  Additional Equipment:   Intra-op Plan:   Post-operative Plan: Extubation in OR  Informed Consent: I have reviewed the patients History and Physical, chart, labs and discussed the procedure including the risks, benefits and alternatives for the proposed anesthesia with the patient or authorized representative who has indicated his/her understanding and acceptance.   Dental Advisory Given  Plan Discussed with: CRNA and Surgeon  Anesthesia Plan Comments:         Anesthesia Quick Evaluation

## 2016-04-21 NOTE — Op Note (Signed)
Procedure: Screening colonoscopy. Normal screening colonoscopy was performed on 03/21/2010. Father and paternal grandmother was diagnosed with colon cancer.  Endoscopist: Earle Gell  Premedication: Propofol administered by anesthesia  Procedure: The patient was placed in the left lateral decubitus position. Anal inspection and digital rectal exam were normal. The Pentax pediatric colonoscope was introduced into the rectum and advanced to the cecum. A normal-appearing appendiceal orifice was identified. A normal-appearing ileocecal valve was intubated and the terminal ileum inspected. Colonic preparation for the exam today was good.  Rectum. Normal. Retroflexed view of the distal rectum was normal  Sigmoid colon and descending colon. Left colonic diverticulosis  Splenic flexure. Normal  Transverse colon. Normal  Hepatic flexure. Normal  Ascending colon. A 5 mm sessile polyp was removed from the mid ascending colon with the cold snare  Cecum and ileocecal valve. Normal  Assessment: A small polyp was removed from the mid ascending colon. Otherwise normal screening colonoscopy.  Recommendation: Schedule repeat colonoscopy in 5 years

## 2016-04-21 NOTE — Op Note (Signed)
[facility] Patient Name: Benjamin [name] Procedure Date: 04/21/2016 MRN: [id number] Attending MD: Garlan Fair , MD Date of Birth: 09-Nov-1945 CSN: [id number] Age: 70 Admit Type: Outpatient Procedure:                 Indications:              Screening for colorectal malignant neoplasm Providers:                Garlan Fair, MD Referring MD:              Medicines:                Propofol per Anesthesia Complications:            No immediate complications. Estimated Blood Loss:     Estimated blood loss: none. Procedure:                - Prior to the procedure, a History and Physical                            was performed, and patient medications and                            allergies were reviewed. The patient's tolerance of                            previous anesthesia was also reviewed. The risks                            and benefits of the procedure and the sedation                            options and risks were discussed with the patient.                            All questions were answered, and informed consent                            was obtained. Prior Anticoagulants: The patient has                            taken aspirin, last dose was 1 day prior to                            procedure. ASA Grade Assessment: III - A patient                            with severe systemic disease. After reviewing the                            risks and benefits, the patient was deemed in                            satisfactory condition to undergo the procedure.  The terminal ileum, the ileocecal valve, the                            appendiceal orifice and the rectum were                            photographed. Scope In: 7:52:19 AM Scope Out: 8:08:39 AM Scope Withdrawal Time: 0 hours 12 minutes 54 seconds  Total Procedure Duration: 0 hours 16 minutes 20 seconds  Findings:      The perianal and digital rectal examinations  were normal.      A 5 mm polyp was found in the mid ascending colon. The polyp was       sessile. The polyp was removed with a cold snare. Resection and       retrieval were complete.      Multiple small and large-mouthed diverticula were found in the sigmoid       colon.      The exam was otherwise without abnormality. Impression:               - Preparation of the colon was poor.                           - One 5 mm polyp in the mid ascending colon,                            removed with a cold snare. Resected and retrieved.                           - Diverticulosis in the sigmoid colon.                           - The examination was otherwise normal. Moderate Sedation:      N/A- Per Anesthesia Care Recommendation:           - Patient has a contact number available for                            emergencies. The signs and symptoms of potential                            delayed complications were discussed with the                            patient. Return to normal activities tomorrow.                            Written discharge instructions were provided to the                            patient.                           - Repeat colonoscopy in 5 years for surveillance.                           - Resume  previous diet.                           - Continue present medications. Procedure Code(s):        --- Professional ---                           [phone], Colonoscopy, flexible; with removal of                            tumor(s), polyp(s), or other lesion(s) by snare                            technique Diagnosis Code(s):        --- Professional ---                           Z12.11, Encounter for screening for malignant                            neoplasm of colon                           D12.2, Benign neoplasm of ascending colon                           K57.30, Diverticulosis of large intestine without                            perforation or abscess without bleeding CPT  copyright 2016 American Medical Association. All rights reserved. The codes documented in this report are preliminary and upon coder review may  be revised to meet current compliance requirements. Earle Gell, MD Garlan Fair, MD 04/21/2016 8:45:04 AM This report has been signed electronically. Number of Addenda: 0

## 2016-04-21 NOTE — H&P (Signed)
  Procedure: Screening colonoscopy. Normal screening colonoscopy was performed on 03/21/2010. Father and paternal grandmother were diagnosed with colon cancer  History: The patient is a 70 year old male born 04-29-1946. He is scheduled to undergo a repeat screening colonoscopy today.  Past medical history: Type 2 diabetes mellitus. Coronary artery disease. Hypercholesterolemia. Tonsillectomy. Cystoscopy with stone removal. Umbilical hernia repair.  Medication allergies: Sulfa drugs cause rash  Exam: The patient is alert and lying comfortably on the endoscopy stretcher. Abdomen is soft and nontender to palpation. Lungs are clear to auscultation. Cardiac exam reveals a regular rhythm with occasional extrasystole.  Plan: Proceed with screening colonoscopy

## 2016-04-21 NOTE — Discharge Instructions (Signed)
Colonoscopy, Care After °Refer to this sheet in the next few weeks. These instructions provide you with information on caring for yourself after your procedure. Your health care provider may also give you more specific instructions. Your treatment has been planned according to current medical practices, but problems sometimes occur. Call your health care provider if you have any problems or questions after your procedure. °WHAT TO EXPECT AFTER THE PROCEDURE  °After your procedure, it is typical to have the following: °· A small amount of blood in your stool. °· Moderate amounts of gas and mild abdominal cramping or bloating. °HOME CARE INSTRUCTIONS °· Do not drive, operate machinery, or sign important documents for 24 hours. °· You may shower and resume your regular physical activities, but move at a slower pace for the first 24 hours. °· Take frequent rest periods for the first 24 hours. °· Walk around or put a warm pack on your abdomen to help reduce abdominal cramping and bloating. °· Drink enough fluids to keep your urine clear or pale yellow. °· You may resume your normal diet as instructed by your health care provider. Avoid heavy or fried foods that are hard to digest. °· Avoid drinking alcohol for 24 hours or as instructed by your health care provider. °· Only take over-the-counter or prescription medicines as directed by your health care provider. °· If a tissue sample (biopsy) was taken during your procedure: °¨ Do not take aspirin or blood thinners for 7 days, or as instructed by your health care provider. °¨ Do not drink alcohol for 7 days, or as instructed by your health care provider. °¨ Eat soft foods for the first 24 hours. °SEEK MEDICAL CARE IF: °You have persistent spotting of blood in your stool 2-3 days after the procedure. °SEEK IMMEDIATE MEDICAL CARE IF: °· You have more than a small spotting of blood in your stool. °· You pass large blood clots in your stool. °· Your abdomen is swollen  (distended). °· You have nausea or vomiting. °· You have a fever. °· You have increasing abdominal pain that is not relieved with medicine. °  °This information is not intended to replace advice given to you by your health care provider. Make sure you discuss any questions you have with your health care provider. °  °Document Released: 04/28/2004 Document Revised: 07/05/2013 Document Reviewed: 05/22/2013 °Elsevier Interactive Patient Education ©2016 [employer] ° °

## 2016-04-21 NOTE — Anesthesia Postprocedure Evaluation (Signed)
Anesthesia Post Note  Patient: Benjamin [name]  Procedure(s) Performed: Procedure(s) (LRB): COLONOSCOPY WITH PROPOFOL (N/A)  Patient location during evaluation: PACU Anesthesia Type: MAC Level of consciousness: awake and alert Pain management: pain level controlled Vital Signs Assessment: post-procedure vital signs reviewed and stable Respiratory status: spontaneous breathing, nonlabored ventilation, respiratory function stable and patient connected to nasal cannula oxygen Cardiovascular status: stable and blood pressure returned to baseline Anesthetic complications: no    Last Vitals:  Vitals:   04/21/16 0720 04/21/16 0810  BP: 124/72 134/86  Pulse: 71 71  Resp: 11 (!) 25  Temp: 36.9 C 36.5 C    Last Pain:  Vitals:   04/21/16 0810  TempSrc: Oral                 Becky Berberian S

## 2016-04-22 ENCOUNTER — Encounter (HOSPITAL_COMMUNITY): Payer: Self-pay | Admitting: Gastroenterology

## 2016-07-27 ENCOUNTER — Other Ambulatory Visit: Payer: Self-pay | Admitting: Cardiology

## 2016-09-11 NOTE — Progress Notes (Signed)
Benjamin [name] Date of Birth: [birth year] Medical Record [id number]  History of Present Illness: Benjamin [name] is seen for follow up CAD. He has a history of coronary disease with prior angioplasty of the proximal LAD in 1991. He also had angioplasty of the circumflex at that time. He has known occlusion of the right coronary with collateral flow. Last Myoview study in October 2015 showed a small anterolateral scar. No ischemia.  He has done very well this past year. Only burning in chest if he does more extreme exertion. He continues to smoke but is drinking less.  He denies any shortness of breath or edema.  Current Outpatient Prescriptions on File Prior to Visit  Medication Sig Dispense Refill  . aspirin 325 MG tablet Take 325 mg by mouth daily.      Marland Kitchen atenolol (TENORMIN) 25 MG tablet TAKE 1 TABLET BY MOUTH EVERY DAY 90 tablet 0  . atorvastatin (LIPITOR) 80 MG tablet TAKE 1 TABLET BY MOUTH DAILY 90 tablet 3  . glimepiride (AMARYL) 4 MG tablet Take 4 mg by mouth daily before breakfast.      . HYDROMET 5-1.5 MG/5ML syrup Take 5 mLs by mouth at bedtime as needed for cough.   0  . INVOKANA 100 MG TABS tablet Take 1 tablet by mouth daily. Take 1 tab daily    . isosorbide mononitrate (IMDUR) 60 MG 24 hr tablet TAKE 1 TABLET BY MOUTH DAILY 90 tablet 3  . JANUVIA 100 MG tablet Take 100 mg by mouth daily.     Marland Kitchen lisinopril (PRINIVIL,ZESTRIL) 20 MG tablet Take 1/2 tablet by mouth daily Call office to schedule your follow up 45 tablet 0  . metFORMIN (GLUCOPHAGE) 1000 MG tablet Take 1 tablet by mouth 2 (two) times daily. Take 1 tab twice a day    . nitroGLYCERIN (NITROSTAT) 0.4 MG SL tablet Place 1 tablet (0.4 mg total) under the tongue every 5 (five) minutes as needed. (Patient taking differently: Place 0.4 mg under the tongue every 5 (five) minutes as needed for chest pain. ) 25 tablet 12  . RANEXA 500 MG 12 hr tablet TAKE 1 TABLET BY MOUTH EVERY DAY 30 tablet 3   No current facility-administered  medications on file prior to visit.     Allergies  Allergen Reactions  . Sulfa Drugs Cross Reactors Rash    Past Medical History:  Diagnosis Date  . Coronary artery disease    Prior PCI to the prox LAD in 1991; S/P PCI to LCX in October 1991  . Diabetes mellitus    TYPE 2  . History of kidney stones    many yrs ago  . Hyperlipidemia   . Tobacco abuse     Past Surgical History:  Procedure Laterality Date  . COLONOSCOPY WITH PROPOFOL N/A 04/21/2016   Procedure: COLONOSCOPY WITH PROPOFOL;  Surgeon: Garlan Fair, MD;  Location: [facility];  Service: Endoscopy;  Laterality: N/A;  . CORONARY ANGIOPLASTY  05/1990   PROXIMAL LAD  . CORONARY ANGIOPLASTY  06/1990   LEFT CIRCUMFLEX CORONARY  . UMBILICAL HERNIA REPAIR      History  Smoking Status  . Current Every Day Smoker  . Packs/day: 1.00  . Years: 54.00  . Types: Cigarettes  Smokeless Tobacco  . Never Used    History  Alcohol Use  . Yes    Comment: every other week    Family History  Problem Relation Age of Onset  . Heart disease Sister  had open heart surgery    Review of Systems: As noted in history of present illness.  All other systems were reviewed and are negative.  Physical Exam: BP 110/64   Pulse 61   Ht 5\' 4"  (1.626 m)   Wt 142 lb 9.6 oz (64.7 kg)   BMI 24.48 kg/m  Patient is very pleasant and in no acute distress. Skin is warm and dry. Color is normal.  HEENT is unremarkable.  Neck is supple. No masses. No JVD. Lungs are clear. Cardiac exam shows a regular rate and rhythm. He does have some occasional ectopics. Abdomen is soft, obese. There are no masses or bruits. Extremities are without edema. Gait and ROM are intact. No gross neurologic deficits noted.   LABORATORY DATA:  Lab Results  Component Value Date   GLUCOSE 167 (H) 11/12/2009   Labs dated 08/05/16: cholesterol 115, triglycerides 56, LDL 49, HDL 56. A1c 7.4%. CMET normal.   ECG today demonstrates normal sinus rhythm. Rate 61  bpm. Otherwise only minor nonspecific ST abnormality. I have personally reviewed and interpreted this study.   Assessment / Plan: 1. Coronary disease with remote angioplasty of the LAD and left circumflex coronary is in 1991. Chronic total occlusion of the right coronary. He has stable anginal symptoms. Myoview study October 2015 was low risk. We'll continue with aspirin, atenolol, and isosorbide. Continue Ranexa.  2. Hyperlipidemia. Continue on high-dose atorvastatin. Excellent control.  3. Diabetes mellitus type 2. Improved control on invokana.  4. Tobacco abuse. Patient counseled on smoking cessation.   Follow up in one  Year.

## 2016-09-14 ENCOUNTER — Encounter: Payer: Self-pay | Admitting: Cardiology

## 2016-09-14 ENCOUNTER — Ambulatory Visit (INDEPENDENT_AMBULATORY_CARE_PROVIDER_SITE_OTHER): Payer: Medicare Other | Admitting: Cardiology

## 2016-09-14 VITALS — BP 110/64 | HR 61 | Ht 64.0 in | Wt 142.6 lb

## 2016-09-14 DIAGNOSIS — E119 Type 2 diabetes mellitus without complications: Secondary | ICD-10-CM | POA: Diagnosis not present

## 2016-09-14 DIAGNOSIS — I25119 Atherosclerotic heart disease of native coronary artery with unspecified angina pectoris: Secondary | ICD-10-CM | POA: Diagnosis not present

## 2016-09-14 DIAGNOSIS — E7800 Pure hypercholesterolemia, unspecified: Secondary | ICD-10-CM | POA: Diagnosis not present

## 2016-09-14 NOTE — Patient Instructions (Signed)
Continue your current therapy  Stop smoking  I will see you in one year  

## 2016-09-27 ENCOUNTER — Other Ambulatory Visit: Payer: Self-pay | Admitting: Cardiology

## 2016-09-27 DIAGNOSIS — I25119 Atherosclerotic heart disease of native coronary artery with unspecified angina pectoris: Secondary | ICD-10-CM

## 2016-09-29 NOTE — Telephone Encounter (Signed)
Rx has been sent to the pharmacy electronically. ° °

## 2016-10-13 ENCOUNTER — Other Ambulatory Visit: Payer: Self-pay | Admitting: Cardiology

## 2016-10-16 ENCOUNTER — Other Ambulatory Visit: Payer: Self-pay

## 2016-10-16 MED ORDER — LISINOPRIL 20 MG PO TABS: ORAL_TABLET | ORAL | 3 refills | Status: DC

## 2016-11-10 ENCOUNTER — Other Ambulatory Visit: Payer: Self-pay | Admitting: Cardiology

## 2017-04-04 ENCOUNTER — Other Ambulatory Visit: Payer: Self-pay | Admitting: Cardiology

## 2017-09-10 ENCOUNTER — Other Ambulatory Visit: Payer: Self-pay

## 2017-09-10 DIAGNOSIS — I25119 Atherosclerotic heart disease of native coronary artery with unspecified angina pectoris: Secondary | ICD-10-CM

## 2017-09-10 MED ORDER — RANOLAZINE ER 500 MG PO TB12: 500.0000 mg | ORAL_TABLET | Freq: Every day | ORAL | 0 refills | Status: DC

## 2017-10-04 ENCOUNTER — Other Ambulatory Visit: Payer: Self-pay

## 2017-10-04 MED ORDER — LISINOPRIL 20 MG PO TABS: 10.0000 mg | ORAL_TABLET | Freq: Every day | ORAL | 0 refills | Status: DC

## 2017-10-04 NOTE — Telephone Encounter (Signed)
Rx(s) sent to pharmacy electronically.  

## 2017-10-20 NOTE — Progress Notes (Signed)
[name] Date of Birth: [birth year] Medical Record #[id number]  History of Present Illness: Benjamin [name] is seen for follow up CAD. He has a history of coronary disease with prior angioplasty of the proximal LAD in 1991. He also had angioplasty of the circumflex at that time. He has known occlusion of the right coronary with collateral flow. Last Myoview study in October 2015 showed a small anterolateral scar. No ischemia.    On follow up today he has done very well this past year. He denies any chest pain, SOB, palpitations. Notes he walks a lot. He is still smoking 1 ppd. Noted last A1c 7.8% on multiple oral meds. Doesn't want to take insulin. Eats a lot of sweets. Loves M&Ms and drinks a lot of sodas. Only complaints are generalized aches and pains.  Allergies as of 10/25/2017      Reactions   Sulfa Drugs Cross Reactors Rash      Medication List        Accurate as of 10/25/17  8:44 AM. Always use your most recent med list.          aspirin EC 81 MG tablet Take 1 tablet (81 mg total) by mouth daily.   atenolol 25 MG tablet Commonly known as:  TENORMIN TAKE 1 TABLET BY MOUTH EVERY DAY   atorvastatin 80 MG tablet Commonly known as:  LIPITOR TAKE 1 TABLET BY MOUTH DAILY   glimepiride 4 MG tablet Commonly known as:  AMARYL Take 4 mg by mouth daily before breakfast.   HYDROMET 5-1.5 MG/5ML syrup Generic drug:  HYDROcodone-homatropine Take 5 mLs by mouth at bedtime as needed for cough.   INVOKANA 100 MG Tabs tablet Generic drug:  canagliflozin Take 300 mg by mouth daily. Take 1 tab daily   isosorbide mononitrate 60 MG 24 hr tablet Commonly known as:  IMDUR TAKE 1 TABLET BY MOUTH DAILY   JANUVIA 100 MG tablet Generic drug:  sitaGLIPtin Take 100 mg by mouth daily.   lisinopril 20 MG tablet Commonly known as:  PRINIVIL,ZESTRIL Take 0.5 tablets (10 mg total) by mouth daily. MUST KEEP APPOINTMENT 10/25/17 WITH DR Martinique FOR FUTURE REFILLS   metFORMIN 1000 MG  tablet Commonly known as:  GLUCOPHAGE Take 1 tablet by mouth 2 (two) times daily. Take 1 tab twice a day   nitroGLYCERIN 0.4 MG SL tablet Commonly known as:  NITROSTAT Place 1 tablet (0.4 mg total) under the tongue every 5 (five) minutes as needed.   ranolazine 500 MG 12 hr tablet Commonly known as:  RANEXA Take 1 tablet (500 mg total) by mouth daily.        Allergies  Allergen Reactions  . Sulfa Drugs Cross Reactors Rash    Past Medical History:  Diagnosis Date  . Coronary artery disease    Prior PCI to the prox LAD in 1991; S/P PCI to LCX in October 1991  . Diabetes mellitus    TYPE 2  . History of kidney stones    many yrs ago  . Hyperlipidemia   . Tobacco abuse     Past Surgical History:  Procedure Laterality Date  . COLONOSCOPY WITH PROPOFOL N/A 04/21/2016   Procedure: COLONOSCOPY WITH PROPOFOL;  Surgeon: Garlan Fair, MD;  Location: [facility];  Service: Endoscopy;  Laterality: N/A;  . CORONARY ANGIOPLASTY  05/1990   PROXIMAL LAD  . CORONARY ANGIOPLASTY  06/1990   LEFT CIRCUMFLEX CORONARY  . UMBILICAL HERNIA REPAIR      Social History  Tobacco Use  Smoking Status Current Every Day Smoker  . Packs/day: 1.00  . Years: 54.00  . Pack years: 54.00  . Types: Cigarettes  Smokeless Tobacco Never Used    Social History   Substance and Sexual Activity  Alcohol Use Yes   Comment: every other week    Family History  Problem Relation Age of Onset  . Heart disease Sister        had open heart surgery    Review of Systems: As noted in history of present illness.  All other systems were reviewed and are negative.  Physical Exam: BP 122/80   Pulse 62   Ht 5\' 4"  (1.626 m)   Wt 145 lb (65.8 kg)   BMI 24.89 kg/m  GENERAL:  Well appearing HEENT:  PERRL, EOMI, sclera are clear. Oropharynx is clear. NECK:  No jugular venous distention, carotid upstroke brisk and symmetric, no bruits, no thyromegaly or adenopathy LUNGS:  Clear to auscultation  bilaterally CHEST:  Unremarkable HEART:  RRR,  PMI not displaced or sustained,S1 and S2 within normal limits, no S3, no S4: no clicks, no rubs, no murmurs ABD:  Soft, nontender. BS +, no masses or bruits. No hepatomegaly, no splenomegaly EXT:  2 + pulses throughout, no edema, no cyanosis no clubbing SKIN:  Warm and dry.  No rashes NEURO:  Alert and oriented x 3. Cranial nerves II through XII intact. PSYCH:  Cognitively intact     LABORATORY DATA:  Lab Results  Component Value Date   GLUCOSE 167 (H) 11/12/2009   Labs dated 08/05/16: cholesterol 115, triglycerides 56, LDL 49, HDL 56. A1c 7.4%. CMET normal. Dated 08/30/17: cholesterol 120, triglycerides 58, HDL 46, LDL 62. A1c 7.8%. Chemistries and CBC normal.   ECG today demonstrates normal sinus rhythm. Rate 62 bpm. Nonspecific ST abnormality. I have personally reviewed and interpreted this study.   Assessment / Plan: 1. Coronary disease with remote angioplasty of the LAD and left circumflex coronary is in 1991. Chronic total occlusion of the right coronary. He has stable anginal symptoms. Myoview study October 2015 was low risk. We'll continue with aspirin, atenolol, and isosorbide. Continue Ranexa.  2. Hyperlipidemia. Continue on high-dose atorvastatin. Excellent control.  3. Diabetes mellitus type 2. Still suboptimal control on metformin, Invokana, glimeperide, and Januvia. Needs to focus more on lifestyle modification with dietary therapy. Focus on eliminating sweets and sodas.   4. Tobacco abuse. Patient counseled on smoking cessation.   Follow up in one  Year.

## 2017-10-25 ENCOUNTER — Ambulatory Visit: Payer: Medicare Other | Admitting: Cardiology

## 2017-10-25 ENCOUNTER — Encounter: Payer: Self-pay | Admitting: Cardiology

## 2017-10-25 VITALS — BP 122/80 | HR 62 | Ht 64.0 in | Wt 145.0 lb

## 2017-10-25 DIAGNOSIS — E7800 Pure hypercholesterolemia, unspecified: Secondary | ICD-10-CM | POA: Diagnosis not present

## 2017-10-25 DIAGNOSIS — Z720 Tobacco use: Secondary | ICD-10-CM

## 2017-10-25 DIAGNOSIS — E119 Type 2 diabetes mellitus without complications: Secondary | ICD-10-CM

## 2017-10-25 DIAGNOSIS — I25119 Atherosclerotic heart disease of native coronary artery with unspecified angina pectoris: Secondary | ICD-10-CM

## 2017-10-25 MED ORDER — ASPIRIN EC 81 MG PO TBEC: 81.0000 mg | DELAYED_RELEASE_TABLET | Freq: Every day | ORAL | 3 refills | Status: DC

## 2017-10-25 NOTE — Patient Instructions (Signed)
Reduce ASA to 81 mg daily  Follow a low sweet/low carbohydrate diet.  Quit smoking.  I will follow up in one year Diabetes Mellitus and Nutrition When you have diabetes (diabetes mellitus), it is very important to have healthy eating habits because your blood sugar (glucose) levels are greatly affected by what you eat and drink. Eating healthy foods in the appropriate amounts, at about the same times every day, can help you:  Control your blood glucose.  Lower your risk of heart disease.  Improve your blood pressure.  Reach or maintain a healthy weight.  Every person with diabetes is different, and each person has different needs for a meal plan. Your health care provider may recommend that you work with a diet and nutrition specialist (dietitian) to make a meal plan that is best for you. Your meal plan may vary depending on factors such as:  The calories you need.  The medicines you take.  Your weight.  Your blood glucose, blood pressure, and cholesterol levels.  Your activity level.  Other health conditions you have, such as heart or kidney disease.  How do carbohydrates affect me? Carbohydrates affect your blood glucose level more than any other type of food. Eating carbohydrates naturally increases the amount of glucose in your blood. Carbohydrate counting is a method for keeping track of how many carbohydrates you eat. Counting carbohydrates is important to keep your blood glucose at a healthy level, especially if you use insulin or take certain oral diabetes medicines. It is important to know how many carbohydrates you can safely have in each meal. This is different for every person. Your dietitian can help you calculate how many carbohydrates you should have at each meal and for snack. Foods that contain carbohydrates include:  Bread, cereal, rice, pasta, and crackers.  Potatoes and corn.  Peas, beans, and lentils.  Milk and yogurt.  Fruit and juice.  Desserts,  such as cakes, cookies, ice cream, and candy.  How does alcohol affect me? Alcohol can cause a sudden decrease in blood glucose (hypoglycemia), especially if you use insulin or take certain oral diabetes medicines. Hypoglycemia can be a life-threatening condition. Symptoms of hypoglycemia (sleepiness, dizziness, and confusion) are similar to symptoms of having too much alcohol. If your health care provider says that alcohol is safe for you, follow these guidelines:  Limit alcohol intake to no more than 1 drink per day for nonpregnant women and 2 drinks per day for men. One drink equals 12 oz of beer, 5 oz of wine, or 1 oz of hard liquor.  Do not drink on an empty stomach.  Keep yourself hydrated with water, diet soda, or unsweetened iced tea.  Keep in mind that regular soda, juice, and other mixers may contain a lot of sugar and must be counted as carbohydrates.  What are tips for following this plan? Reading food labels  Start by checking the serving size on the label. The amount of calories, carbohydrates, fats, and other nutrients listed on the label are based on one serving of the food. Many foods contain more than one serving per package.  Check the total grams (g) of carbohydrates in one serving. You can calculate the number of servings of carbohydrates in one serving by dividing the total carbohydrates by 15. For example, if a food has 30 g of total carbohydrates, it would be equal to 2 servings of carbohydrates.  Check the number of grams (g) of saturated and trans fats in one serving. Choose  foods that have low or no amount of these fats.  Check the number of milligrams (mg) of sodium in one serving. Most people should limit total sodium intake to less than 2,300 mg per day.  Always check the nutrition information of foods labeled as "low-fat" or "nonfat". These foods may be higher in added sugar or refined carbohydrates and should be avoided.  Talk to your dietitian to identify  your daily goals for nutrients listed on the label. Shopping  Avoid buying canned, premade, or processed foods. These foods tend to be high in fat, sodium, and added sugar.  Shop around the outside edge of the grocery store. This includes fresh fruits and vegetables, bulk grains, fresh meats, and fresh dairy. Cooking  Use low-heat cooking methods, such as baking, instead of high-heat cooking methods like deep frying.  Cook using healthy oils, such as olive, canola, or sunflower oil.  Avoid cooking with butter, cream, or high-fat meats. Meal planning  Eat meals and snacks regularly, preferably at the same times every day. Avoid going long periods of time without eating.  Eat foods high in fiber, such as fresh fruits, vegetables, beans, and whole grains. Talk to your dietitian about how many servings of carbohydrates you can eat at each meal.  Eat 4-6 ounces of lean protein each day, such as lean meat, chicken, fish, eggs, or tofu. 1 ounce is equal to 1 ounce of meat, chicken, or fish, 1 egg, or 1/4 cup of tofu.  Eat some foods each day that contain healthy fats, such as avocado, nuts, seeds, and fish. Lifestyle   Check your blood glucose regularly.  Exercise at least 30 minutes 5 or more days each week, or as told by your health care provider.  Take medicines as told by your health care provider.  Do not use any products that contain nicotine or tobacco, such as cigarettes and e-cigarettes. If you need help quitting, ask your health care provider.  Work with a Social worker or diabetes educator to identify strategies to manage stress and any emotional and social challenges. What are some questions to ask my health care provider?  Do I need to meet with a diabetes educator?  Do I need to meet with a dietitian?  What number can I call if I have questions?  When are the best times to check my blood glucose? Where to find more information:  American Diabetes Association:  diabetes.org/food-and-fitness/food  Academy of Nutrition and Dietetics: PokerClues.dk  [employer] of Diabetes and Digestive and Kidney Diseases (NIH): ContactWire.be Summary  A healthy meal plan will help you control your blood glucose and maintain a healthy lifestyle.  Working with a diet and nutrition specialist (dietitian) can help you make a meal plan that is best for you.  Keep in mind that carbohydrates and alcohol have immediate effects on your blood glucose levels. It is important to count carbohydrates and to use alcohol carefully. This information is not intended to replace advice given to you by your health care provider. Make sure you discuss any questions you have with your health care provider. Document Released: 06/11/2005 Document Revised: 10/19/2016 Document Reviewed: 10/19/2016 Elsevier Interactive Patient Education  [employer].

## 2017-11-04 ENCOUNTER — Other Ambulatory Visit: Payer: Self-pay | Admitting: *Deleted

## 2017-11-04 MED ORDER — LISINOPRIL 20 MG PO TABS: 10.0000 mg | ORAL_TABLET | Freq: Every day | ORAL | 3 refills | Status: DC

## 2017-11-29 ENCOUNTER — Other Ambulatory Visit: Payer: Self-pay | Admitting: Cardiology

## 2017-12-31 ENCOUNTER — Other Ambulatory Visit: Payer: Self-pay | Admitting: Cardiology

## 2017-12-31 NOTE — Telephone Encounter (Signed)
Rx has been sent to the pharmacy electronically. ° °

## 2018-01-14 ENCOUNTER — Other Ambulatory Visit: Payer: Self-pay

## 2018-01-14 DIAGNOSIS — I25119 Atherosclerotic heart disease of native coronary artery with unspecified angina pectoris: Secondary | ICD-10-CM

## 2018-01-14 MED ORDER — RANOLAZINE ER 500 MG PO TB12: 500.0000 mg | ORAL_TABLET | Freq: Every day | ORAL | 0 refills | Status: DC

## 2018-03-09 ENCOUNTER — Other Ambulatory Visit: Payer: Self-pay | Admitting: Cardiology

## 2018-04-25 ENCOUNTER — Telehealth: Payer: Self-pay | Admitting: Cardiology

## 2018-04-25 DIAGNOSIS — I25119 Atherosclerotic heart disease of native coronary artery with unspecified angina pectoris: Secondary | ICD-10-CM

## 2018-04-25 MED ORDER — RANOLAZINE ER 500 MG PO TB12
500.0000 mg | ORAL_TABLET | Freq: Every day | ORAL | 1 refills | Status: DC
Start: 2018-04-25 — End: 2018-04-28

## 2018-04-25 NOTE — Telephone Encounter (Signed)
New Message        *STAT* If patient is at the pharmacy, call can be transferred to refill team.   1. Which medications need to be refilled? (please list name of each medication and dose if known) Ranolazine  2. Which pharmacy/location (including street and city if local pharmacy) is medication to be sent to? [facility] on [address]  3. Do they need a 30 day or 90 day supply? [place]

## 2018-04-26 ENCOUNTER — Telehealth: Payer: Self-pay

## 2018-04-26 DIAGNOSIS — I25119 Atherosclerotic heart disease of native coronary artery with unspecified angina pectoris: Secondary | ICD-10-CM

## 2018-04-28 MED ORDER — RANOLAZINE ER 500 MG PO TB12
500.0000 mg | ORAL_TABLET | Freq: Every day | ORAL | 3 refills | Status: DC
Start: 2018-04-28 — End: 2019-04-05

## 2018-04-28 NOTE — Telephone Encounter (Signed)
Follow up    Patient's spouse calling, angry. States patient is out of medication. Refill sent to wrong pharmacy. Please re-send to [facility]. Please call spouse at [phone]  1. Which medications need to be refilled? (please list name of each medication and dose if known) Ranolazine  2. Which pharmacy/location (including street and city if local pharmacy) is medication to be sent to? [facility] on [address]  3. Do they need a 30 day or 90 day supply? [place]

## 2018-04-28 NOTE — Telephone Encounter (Signed)
Spoke with pt who state Ranex prescription was sent over to the wrong pharmacy. New refill sent to [facility] on [address]

## 2018-10-30 NOTE — H&P (View-Only) (Signed)
Nikolay De Nurse Date of Birth: 14-Dec-1945 Medical Record #[id number]  History of Present Illness: Benjamin [name] is seen for follow up CAD. He has a history of coronary disease with prior angioplasty of the proximal LAD in 1991. He also had angioplasty of the circumflex at that time. He has known occlusion of the right coronary with collateral flow. Last Myoview study in October 2015 showed a small anterolateral scar. No ischemia.    On follow up today he notes that over the past 3 months he has 3 episodes of chest pain for which he took sl Ntg. Lasted 5-10 minutes. No clear triggers. One time he became diaphoretic.  He denies dyspnea or palpitation.  He is still smoking but states he is smoking and drinking less. Still loves eating M&Ms.    Allergies as of 11/01/2018      Reactions   Sulfa Drugs Cross Reactors Rash      Medication List       Accurate as of November 01, 2018 10:09 AM. Always use your most recent med list.        aspirin EC 81 MG tablet Take 1 tablet (81 mg total) by mouth daily.   atenolol 25 MG tablet Commonly known as:  TENORMIN TAKE 1 TABLET BY MOUTH EVERY DAY   atorvastatin 80 MG tablet Commonly known as:  LIPITOR TAKE 1 TABLET BY MOUTH DAILY   glimepiride 4 MG tablet Commonly known as:  AMARYL Take 4 mg by mouth daily before breakfast.   HYDROMET 5-1.5 MG/5ML syrup Generic drug:  HYDROcodone-homatropine Take 5 mLs by mouth at bedtime as needed for cough.   INVOKANA 100 MG Tabs tablet Generic drug:  canagliflozin Take 300 mg by mouth daily. Take 1 tab daily   isosorbide mononitrate 60 MG 24 hr tablet Commonly known as:  IMDUR TAKE 1 TABLET BY MOUTH DAILY   JANUVIA 100 MG tablet Generic drug:  sitaGLIPtin Take 100 mg by mouth daily.   lisinopril 20 MG tablet Commonly known as:  PRINIVIL,ZESTRIL Take 0.5 tablets (10 mg total) by mouth daily.   metFORMIN 1000 MG tablet Commonly known as:  GLUCOPHAGE Take 1 tablet by mouth 2 (two) times daily. Take  1 tab twice a day   nitroGLYCERIN 0.4 MG SL tablet Commonly known as:  NITROSTAT Place 1 tablet (0.4 mg total) under the tongue every 5 (five) minutes as needed.   ranolazine 500 MG 12 hr tablet Commonly known as:  RANEXA Take 1 tablet (500 mg total) by mouth daily.        Allergies  Allergen Reactions  . Sulfa Drugs Cross Reactors Rash    Past Medical History:  Diagnosis Date  . Coronary artery disease    Prior PCI to the prox LAD in 1991; S/P PCI to LCX in October 1991  . Diabetes mellitus    TYPE 2  . History of kidney stones    many yrs ago  . Hyperlipidemia   . Tobacco abuse     Past Surgical History:  Procedure Laterality Date  . COLONOSCOPY WITH PROPOFOL N/A 04/21/2016   Procedure: COLONOSCOPY WITH PROPOFOL;  Surgeon: Garlan Fair, MD;  Location: [facility];  Service: Endoscopy;  Laterality: N/A;  . CORONARY ANGIOPLASTY  05/1990   PROXIMAL LAD  . CORONARY ANGIOPLASTY  06/1990   LEFT CIRCUMFLEX CORONARY  . UMBILICAL HERNIA REPAIR      Social History   Tobacco Use  Smoking Status Current Every Day Smoker  . Packs/day: 1.00  .  Years: 54.00  . Pack years: 54.00  . Types: Cigarettes  Smokeless Tobacco Never Used    Social History   Substance and Sexual Activity  Alcohol Use Yes   Comment: every other week    Family History  Problem Relation Age of Onset  . Heart disease Sister        had open heart surgery    Review of Systems: As noted in history of present illness.  All other systems were reviewed and are negative.  Physical Exam: BP 106/64   Pulse 63   Ht 5' 4.5" (1.638 m)   Wt 143 lb 12.8 oz (65.2 kg)   BMI 24.30 kg/m  GENERAL:  Well appearing WM in NAD HEENT:  PERRL, EOMI, sclera are clear. Oropharynx is clear. NECK:  No jugular venous distention, carotid upstroke brisk and symmetric, no bruits, no thyromegaly or adenopathy LUNGS:  Clear to auscultation bilaterally CHEST:  Unremarkable HEART:  RRR,  PMI not displaced or  sustained,S1 and S2 within normal limits, no S3, no S4: no clicks, no rubs, no murmurs ABD:  Soft, nontender. BS +, no masses or bruits. No hepatomegaly, no splenomegaly EXT:  2 + pulses throughout, no edema, no cyanosis no clubbing SKIN:  Warm and dry.  No rashes NEURO:  Alert and oriented x 3. Cranial nerves II through XII intact. PSYCH:  Cognitively intact       LABORATORY DATA:  Lab Results  Component Value Date   GLUCOSE 167 (H) 11/12/2009   Labs dated 08/05/16: cholesterol 115, triglycerides 56, LDL 49, HDL 56. A1c 7.4%. CMET normal. Dated 08/30/17: cholesterol 120, triglycerides 58, HDL 46, LDL 62. A1c 7.8%. Chemistries and CBC normal.  Dated 09/16/18: cholesterol 106, triglycerides 51, HDL 48, LDL48. A1c 7.4%. CMET normal  ECG today demonstrates normal sinus rhythm with PVCs. Rate 63 bpm. Nonspecific ST abnormality. I have personally reviewed and interpreted this study.   Assessment / Plan: 1. Coronary disease with remote angioplasty of the LAD and left circumflex coronary is in 1991. Chronic total occlusion of the right coronary. He has more recently had some anginal episodes requiring Ntg. He is on maximal antianginal therapy with  aspirin, atenolol, and isosorbide. Continue Ranexa. We will update a Lexiscan myoview study now.   2. Hyperlipidemia. Continue on high-dose atorvastatin. Excellent control.  3. Diabetes mellitus type 2. Still suboptimal control on metformin, Invokana, glimeperide, and Januvia. Needs to focus more on lifestyle modification with dietary therapy. Focus on eliminating sweets and sodas.   4. Tobacco abuse. Patient counseled on smoking cessation.

## 2018-10-30 NOTE — Progress Notes (Signed)
Stelios De Nurse Date of Birth: September 21, 1946 Medical Record #[id number]  History of Present Illness: Benjamin [name] is seen for follow up CAD. He has a history of coronary disease with prior angioplasty of the proximal LAD in 1991. He also had angioplasty of the circumflex at that time. He has known occlusion of the right coronary with collateral flow. Last Myoview study in October 2015 showed a small anterolateral scar. No ischemia.    On follow up today he notes that over the past 3 months he has 3 episodes of chest pain for which he took sl Ntg. Lasted 5-10 minutes. No clear triggers. One time he became diaphoretic.  He denies dyspnea or palpitation.  He is still smoking but states he is smoking and drinking less. Still loves eating M&Ms.    Allergies as of 11/01/2018      Reactions   Sulfa Drugs Cross Reactors Rash      Medication List       Accurate as of November 01, 2018 10:09 AM. Always use your most recent med list.        aspirin EC 81 MG tablet Take 1 tablet (81 mg total) by mouth daily.   atenolol 25 MG tablet Commonly known as:  TENORMIN TAKE 1 TABLET BY MOUTH EVERY DAY   atorvastatin 80 MG tablet Commonly known as:  LIPITOR TAKE 1 TABLET BY MOUTH DAILY   glimepiride 4 MG tablet Commonly known as:  AMARYL Take 4 mg by mouth daily before breakfast.   HYDROMET 5-1.5 MG/5ML syrup Generic drug:  HYDROcodone-homatropine Take 5 mLs by mouth at bedtime as needed for cough.   INVOKANA 100 MG Tabs tablet Generic drug:  canagliflozin Take 300 mg by mouth daily. Take 1 tab daily   isosorbide mononitrate 60 MG 24 hr tablet Commonly known as:  IMDUR TAKE 1 TABLET BY MOUTH DAILY   JANUVIA 100 MG tablet Generic drug:  sitaGLIPtin Take 100 mg by mouth daily.   lisinopril 20 MG tablet Commonly known as:  PRINIVIL,ZESTRIL Take 0.5 tablets (10 mg total) by mouth daily.   metFORMIN 1000 MG tablet Commonly known as:  GLUCOPHAGE Take 1 tablet by mouth 2 (two) times daily. Take  1 tab twice a day   nitroGLYCERIN 0.4 MG SL tablet Commonly known as:  NITROSTAT Place 1 tablet (0.4 mg total) under the tongue every 5 (five) minutes as needed.   ranolazine 500 MG 12 hr tablet Commonly known as:  RANEXA Take 1 tablet (500 mg total) by mouth daily.        Allergies  Allergen Reactions  . Sulfa Drugs Cross Reactors Rash    Past Medical History:  Diagnosis Date  . Coronary artery disease    Prior PCI to the prox LAD in 1991; S/P PCI to LCX in October 1991  . Diabetes mellitus    TYPE 2  . History of kidney stones    many yrs ago  . Hyperlipidemia   . Tobacco abuse     Past Surgical History:  Procedure Laterality Date  . COLONOSCOPY WITH PROPOFOL N/A 04/21/2016   Procedure: COLONOSCOPY WITH PROPOFOL;  Surgeon: Garlan Fair, MD;  Location: [facility];  Service: Endoscopy;  Laterality: N/A;  . CORONARY ANGIOPLASTY  05/1990   PROXIMAL LAD  . CORONARY ANGIOPLASTY  06/1990   LEFT CIRCUMFLEX CORONARY  . UMBILICAL HERNIA REPAIR      Social History   Tobacco Use  Smoking Status Current Every Day Smoker  . Packs/day: 1.00  .  Years: 54.00  . Pack years: 54.00  . Types: Cigarettes  Smokeless Tobacco Never Used    Social History   Substance and Sexual Activity  Alcohol Use Yes   Comment: every other week    Family History  Problem Relation Age of Onset  . Heart disease Sister        had open heart surgery    Review of Systems: As noted in history of present illness.  All other systems were reviewed and are negative.  Physical Exam: BP 106/64   Pulse 63   Ht 5' 4.5" (1.638 m)   Wt 143 lb 12.8 oz (65.2 kg)   BMI 24.30 kg/m  GENERAL:  Well appearing WM in NAD HEENT:  PERRL, EOMI, sclera are clear. Oropharynx is clear. NECK:  No jugular venous distention, carotid upstroke brisk and symmetric, no bruits, no thyromegaly or adenopathy LUNGS:  Clear to auscultation bilaterally CHEST:  Unremarkable HEART:  RRR,  PMI not displaced or  sustained,S1 and S2 within normal limits, no S3, no S4: no clicks, no rubs, no murmurs ABD:  Soft, nontender. BS +, no masses or bruits. No hepatomegaly, no splenomegaly EXT:  2 + pulses throughout, no edema, no cyanosis no clubbing SKIN:  Warm and dry.  No rashes NEURO:  Alert and oriented x 3. Cranial nerves II through XII intact. PSYCH:  Cognitively intact       LABORATORY DATA:  Lab Results  Component Value Date   GLUCOSE 167 (H) 11/12/2009   Labs dated 08/05/16: cholesterol 115, triglycerides 56, LDL 49, HDL 56. A1c 7.4%. CMET normal. Dated 08/30/17: cholesterol 120, triglycerides 58, HDL 46, LDL 62. A1c 7.8%. Chemistries and CBC normal.  Dated 09/16/18: cholesterol 106, triglycerides 51, HDL 48, LDL48. A1c 7.4%. CMET normal  ECG today demonstrates normal sinus rhythm with PVCs. Rate 63 bpm. Nonspecific ST abnormality. I have personally reviewed and interpreted this study.   Assessment / Plan: 1. Coronary disease with remote angioplasty of the LAD and left circumflex coronary is in 1991. Chronic total occlusion of the right coronary. He has more recently had some anginal episodes requiring Ntg. He is on maximal antianginal therapy with  aspirin, atenolol, and isosorbide. Continue Ranexa. We will update a Lexiscan myoview study now.   2. Hyperlipidemia. Continue on high-dose atorvastatin. Excellent control.  3. Diabetes mellitus type 2. Still suboptimal control on metformin, Invokana, glimeperide, and Januvia. Needs to focus more on lifestyle modification with dietary therapy. Focus on eliminating sweets and sodas.   4. Tobacco abuse. Patient counseled on smoking cessation.

## 2018-11-01 ENCOUNTER — Encounter: Payer: Self-pay | Admitting: Cardiology

## 2018-11-01 ENCOUNTER — Ambulatory Visit (INDEPENDENT_AMBULATORY_CARE_PROVIDER_SITE_OTHER): Payer: Medicare Other | Admitting: Cardiology

## 2018-11-01 VITALS — BP 106/64 | HR 63 | Ht 64.5 in | Wt 143.8 lb

## 2018-11-01 DIAGNOSIS — E119 Type 2 diabetes mellitus without complications: Secondary | ICD-10-CM

## 2018-11-01 DIAGNOSIS — I25119 Atherosclerotic heart disease of native coronary artery with unspecified angina pectoris: Secondary | ICD-10-CM

## 2018-11-01 DIAGNOSIS — E7800 Pure hypercholesterolemia, unspecified: Secondary | ICD-10-CM

## 2018-11-01 DIAGNOSIS — Z720 Tobacco use: Secondary | ICD-10-CM

## 2018-11-01 NOTE — Patient Instructions (Signed)
We will schedule you for a Nuclear stress test

## 2018-11-04 ENCOUNTER — Telehealth (HOSPITAL_COMMUNITY): Payer: Self-pay

## 2018-11-04 NOTE — Telephone Encounter (Signed)
Encounter complete. 

## 2018-11-09 ENCOUNTER — Ambulatory Visit (HOSPITAL_COMMUNITY)
Admission: RE | Admit: 2018-11-09 | Discharge: 2018-11-09 | Disposition: A | Discharge status: Home / Self Care | Payer: Medicare Other | Source: Ambulatory Visit | Attending: Cardiovascular Disease | Admitting: Cardiovascular Disease

## 2018-11-09 DIAGNOSIS — Z720 Tobacco use: Secondary | ICD-10-CM | POA: Diagnosis present

## 2018-11-09 DIAGNOSIS — I25119 Atherosclerotic heart disease of native coronary artery with unspecified angina pectoris: Secondary | ICD-10-CM

## 2018-11-09 DIAGNOSIS — E7800 Pure hypercholesterolemia, unspecified: Secondary | ICD-10-CM

## 2018-11-09 DIAGNOSIS — E119 Type 2 diabetes mellitus without complications: Secondary | ICD-10-CM

## 2018-11-09 LAB — MYOCARDIAL PERFUSION IMAGING
LV dias vol: 77 mL (ref 62–150)
LV sys vol: 32 mL
Peak HR: 97 {beats}/min
Rest HR: 53 {beats}/min
SDS: 1
SRS: 2
SSS: 3
TID: 1.13

## 2018-11-09 MED ORDER — TECHNETIUM TC 99M TETROFOSMIN IV KIT
31.4000 | PACK | Freq: Once | INTRAVENOUS | Status: AC | PRN
  Administered 2018-11-09: 31.4 via INTRAVENOUS
  Filled 2018-11-09: qty 32

## 2018-11-09 MED ORDER — REGADENOSON 0.4 MG/5ML IV SOLN
0.4000 mg | Freq: Once | INTRAVENOUS | Status: AC
  Administered 2018-11-09: 0.4 mg via INTRAVENOUS

## 2018-11-09 MED ORDER — TECHNETIUM TC 99M TETROFOSMIN IV KIT
10.1000 | PACK | Freq: Once | INTRAVENOUS | Status: AC | PRN
  Administered 2018-11-09: 10.1 via INTRAVENOUS
  Filled 2018-11-09: qty 11

## 2018-11-10 ENCOUNTER — Other Ambulatory Visit: Payer: Self-pay

## 2018-11-10 ENCOUNTER — Telehealth: Payer: Self-pay

## 2018-11-10 DIAGNOSIS — I25119 Atherosclerotic heart disease of native coronary artery with unspecified angina pectoris: Secondary | ICD-10-CM

## 2018-11-10 NOTE — Telephone Encounter (Signed)
 @  [facility] [place] [address] Dept: [phone] Loc: [phone]  LARZ MARK  11/10/2018  You are scheduled for a Cardiac Cathon Friday 11/18/18, with Dr.Jordan.  1. Please arrive at the [facility] (Main Entrance A) at [facility]: [address] [place], [place] [address] at 7:00 am (This time is two hours before your procedure to ensure your preparation). Free valet parking service is available.   Special note: Every effort is made to have your procedure done on time. Please understand that emergencies sometimes delay scheduled procedures.  2. Diet: Do not eat solid foods after midnight.  The patient may have clear liquids until 5am upon the day of the procedure.  3. Labs: You will need to have blood drawn on  Monday 11/14/18 at [place] office [employer] ) You     do not need to be fasting.  4. Medication instructions in preparation for your procedure:     Hold Metformin morning of cath and Hold 2 days after cath  Hold Glimepiride,Invokana,Januvia morning of cath        On the morning of your procedure, take your Aspirin 81 mg and any morning medicines NOT listed above.  You may use sips of water.  5. Plan for one night stay--bring personal belongings. 6. Bring a current list of your medications and current insurance cards. 7. You MUST have a responsible person to drive you home. 8. Someone MUST be with you the first 24 hours after you arrive home or your discharge will be delayed. 9. Please wear clothes that are easy to get on and off and wear slip-on shoes.  Thank you for allowing [place] to care for you!   -- [facility]

## 2018-11-14 ENCOUNTER — Other Ambulatory Visit: Payer: Self-pay | Admitting: Cardiology

## 2018-11-14 DIAGNOSIS — I209 Angina pectoris, unspecified: Secondary | ICD-10-CM

## 2018-11-14 LAB — BASIC METABOLIC PANEL
BUN/Creatinine Ratio: 16 (ref 10–24)
BUN: 16 mg/dL (ref 8–27)
CO2: 19 mmol/L — ABNORMAL LOW (ref 20–29)
Calcium: 9.5 mg/dL (ref 8.6–10.2)
Chloride: 100 mmol/L (ref 96–106)
Creatinine, Ser: 0.98 mg/dL (ref 0.76–1.27)
GFR calc Af Amer: 88 mL/min/{1.73_m2} (ref >59)
GFR calc non Af Amer: 76 mL/min/{1.73_m2} (ref >59)
Glucose: 262 mg/dL — ABNORMAL HIGH (ref 65–99)
Potassium: 4.9 mmol/L (ref 3.5–5.2)
Sodium: 136 mmol/L (ref 134–144)

## 2018-11-15 LAB — CBC WITH DIFFERENTIAL/PLATELET
Basophils Absolute: 0.1 10*3/uL (ref 0.0–0.2)
Basos: 1 %
EOS (ABSOLUTE): 0.1 10*3/uL (ref 0.0–0.4)
Eos: 1 %
Hematocrit: 43.9 % (ref 37.5–51.0)
Hemoglobin: 14.6 g/dL (ref 13.0–17.7)
Immature Grans (Abs): 0 10*3/uL (ref 0.0–0.1)
Immature Granulocytes: 1 %
Lymphocytes Absolute: 2.2 10*3/uL (ref 0.7–3.1)
Lymphs: 25 %
MCH: 28.9 pg (ref 26.6–33.0)
MCHC: 33.3 g/dL (ref 31.5–35.7)
MCV: 87 fL (ref 79–97)
Monocytes Absolute: 0.7 10*3/uL (ref 0.1–0.9)
Monocytes: 8 %
Neutrophils Absolute: 5.6 10*3/uL (ref 1.4–7.0)
Neutrophils: 64 %
Platelets: 167 10*3/uL (ref 150–450)
RBC: 5.05 x10E6/uL (ref 4.14–5.80)
RDW: 14.5 % (ref 11.6–15.4)
WBC: 8.8 10*3/uL (ref 3.4–10.8)

## 2018-11-17 ENCOUNTER — Telehealth: Payer: Self-pay | Admitting: *Deleted

## 2018-11-17 NOTE — Telephone Encounter (Signed)
Pt contacted pre-catheterization scheduled at [facility] for: Verified arrival time and place: [facility] Main Entrance A at:  No solid food after midnight prior to cath, clear liquids until 5 AM day of procedure. Contrast allergy: no  Hold: Metformin-day of procedure and 48 hours post procedure. Januvia-day of procedure. Invokana-day of procedure. Amaryl-day of procedure.  Except hold medications AM meds can be  taken pre-cath with sip of water including: ASA 81 mg  Confirmed patient has responsible person to drive home post procedure and observe 24 hours after arriving home: yes

## 2018-11-18 ENCOUNTER — Encounter (HOSPITAL_COMMUNITY)
Admission: RE | Disposition: A | Discharge status: Home / Self Care | Payer: Self-pay | Source: Home / Self Care | Attending: Cardiology

## 2018-11-18 ENCOUNTER — Encounter (HOSPITAL_COMMUNITY): Payer: Self-pay | Admitting: Cardiology

## 2018-11-18 ENCOUNTER — Ambulatory Visit (HOSPITAL_COMMUNITY)
Admission: RE | Admit: 2018-11-18 | Discharge: 2018-11-18 | Disposition: A | Discharge status: Home / Self Care | Payer: Medicare Other | Attending: Cardiology | Admitting: Cardiology

## 2018-11-18 ENCOUNTER — Other Ambulatory Visit: Payer: Self-pay

## 2018-11-18 DIAGNOSIS — E119 Type 2 diabetes mellitus without complications: Secondary | ICD-10-CM

## 2018-11-18 DIAGNOSIS — I2584 Coronary atherosclerosis due to calcified coronary lesion: Secondary | ICD-10-CM | POA: Diagnosis not present

## 2018-11-18 DIAGNOSIS — Z8249 Family history of ischemic heart disease and other diseases of the circulatory system: Secondary | ICD-10-CM | POA: Diagnosis not present

## 2018-11-18 DIAGNOSIS — Z720 Tobacco use: Secondary | ICD-10-CM | POA: Diagnosis present

## 2018-11-18 DIAGNOSIS — Z882 Allergy status to sulfonamides status: Secondary | ICD-10-CM | POA: Diagnosis not present

## 2018-11-18 DIAGNOSIS — I2582 Chronic total occlusion of coronary artery: Secondary | ICD-10-CM | POA: Insufficient documentation

## 2018-11-18 DIAGNOSIS — I209 Angina pectoris, unspecified: Secondary | ICD-10-CM | POA: Diagnosis present

## 2018-11-18 DIAGNOSIS — I25119 Atherosclerotic heart disease of native coronary artery with unspecified angina pectoris: Secondary | ICD-10-CM | POA: Diagnosis not present

## 2018-11-18 DIAGNOSIS — F17210 Nicotine dependence, cigarettes, uncomplicated: Secondary | ICD-10-CM | POA: Diagnosis not present

## 2018-11-18 DIAGNOSIS — I2510 Atherosclerotic heart disease of native coronary artery without angina pectoris: Secondary | ICD-10-CM | POA: Diagnosis present

## 2018-11-18 DIAGNOSIS — E785 Hyperlipidemia, unspecified: Secondary | ICD-10-CM | POA: Insufficient documentation

## 2018-11-18 HISTORY — PX: LEFT HEART CATH AND CORONARY ANGIOGRAPHY: CATH118249

## 2018-11-18 LAB — GLUCOSE, CAPILLARY: Glucose-Capillary: 166 mg/dL — ABNORMAL HIGH (ref 70–99)

## 2018-11-18 SURGERY — LEFT HEART CATH AND CORONARY ANGIOGRAPHY
Anesthesia: LOCAL

## 2018-11-18 MED ORDER — HEPARIN SODIUM (PORCINE) 1000 UNIT/ML IJ SOLN
INTRAMUSCULAR | Status: DC | PRN
  Administered 2018-11-18: 3500 [IU] via INTRAVENOUS

## 2018-11-18 MED ORDER — FENTANYL CITRATE (PF) 100 MCG/2ML IJ SOLN
INTRAMUSCULAR | Status: DC | PRN
  Administered 2018-11-18: 25 ug via INTRAVENOUS

## 2018-11-18 MED ORDER — HEPARIN SODIUM (PORCINE) 1000 UNIT/ML IJ SOLN
INTRAMUSCULAR | Status: AC
  Filled 2018-11-18: qty 1

## 2018-11-18 MED ORDER — SODIUM CHLORIDE 0.9 % WEIGHT BASED INFUSION
3.0000 mL/kg/h | INTRAVENOUS | Status: AC
  Administered 2018-11-18: 3 mL/kg/h via INTRAVENOUS

## 2018-11-18 MED ORDER — SODIUM CHLORIDE 0.9 % IV SOLN: 250.0000 mL | INTRAVENOUS | Status: DC | PRN

## 2018-11-18 MED ORDER — LIDOCAINE HCL (PF) 1 % IJ SOLN
INTRAMUSCULAR | Status: DC | PRN
  Administered 2018-11-18: 2 mL

## 2018-11-18 MED ORDER — HEPARIN (PORCINE) IN NACL 1000-0.9 UT/500ML-% IV SOLN
INTRAVENOUS | Status: DC | PRN
  Administered 2018-11-18: 500 mL

## 2018-11-18 MED ORDER — HEPARIN (PORCINE) IN NACL 1000-0.9 UT/500ML-% IV SOLN
INTRAVENOUS | Status: AC
  Filled 2018-11-18: qty 500

## 2018-11-18 MED ORDER — SODIUM CHLORIDE 0.9 % WEIGHT BASED INFUSION: 1.0000 mL/kg/h | INTRAVENOUS | Status: AC

## 2018-11-18 MED ORDER — VERAPAMIL HCL 2.5 MG/ML IV SOLN
INTRAVENOUS | Status: DC | PRN
  Administered 2018-11-18: 10 mL via INTRA_ARTERIAL

## 2018-11-18 MED ORDER — ASPIRIN 81 MG PO CHEW: 81.0000 mg | CHEWABLE_TABLET | ORAL | Status: DC

## 2018-11-18 MED ORDER — SODIUM CHLORIDE 0.9% FLUSH: 3.0000 mL | INTRAVENOUS | Status: DC | PRN

## 2018-11-18 MED ORDER — SODIUM CHLORIDE 0.9% FLUSH: 3.0000 mL | Freq: Two times a day (BID) | INTRAVENOUS | Status: DC

## 2018-11-18 MED ORDER — LIDOCAINE HCL (PF) 1 % IJ SOLN
INTRAMUSCULAR | Status: AC
  Filled 2018-11-18: qty 30

## 2018-11-18 MED ORDER — ACETAMINOPHEN 325 MG PO TABS: 650.0000 mg | ORAL_TABLET | ORAL | Status: DC | PRN

## 2018-11-18 MED ORDER — MIDAZOLAM HCL 2 MG/2ML IJ SOLN
INTRAMUSCULAR | Status: AC
  Filled 2018-11-18: qty 2

## 2018-11-18 MED ORDER — METFORMIN HCL 1000 MG PO TABS: 1000.0000 mg | ORAL_TABLET | Freq: Two times a day (BID) | ORAL | Status: DC

## 2018-11-18 MED ORDER — ONDANSETRON HCL 4 MG/2ML IJ SOLN: 4.0000 mg | Freq: Four times a day (QID) | INTRAMUSCULAR | Status: DC | PRN

## 2018-11-18 MED ORDER — VERAPAMIL HCL 2.5 MG/ML IV SOLN
INTRAVENOUS | Status: AC
  Filled 2018-11-18: qty 2

## 2018-11-18 MED ORDER — IOHEXOL 350 MG/ML SOLN
INTRAVENOUS | Status: DC | PRN
  Administered 2018-11-18: 80 mL via INTRA_ARTERIAL

## 2018-11-18 MED ORDER — FENTANYL CITRATE (PF) 100 MCG/2ML IJ SOLN
INTRAMUSCULAR | Status: AC
  Filled 2018-11-18: qty 2

## 2018-11-18 MED ORDER — SODIUM CHLORIDE 0.9 % WEIGHT BASED INFUSION: 1.0000 mL/kg/h | INTRAVENOUS | Status: DC

## 2018-11-18 MED ORDER — MIDAZOLAM HCL 2 MG/2ML IJ SOLN
INTRAMUSCULAR | Status: DC | PRN
  Administered 2018-11-18: 1 mg via INTRAVENOUS

## 2018-11-18 SURGICAL SUPPLY — 11 items
CATH 5FR JL3.5 JR4 ANG PIG MP (CATHETERS) ×2 IMPLANT
CATH INFINITI 5FR JL4 (CATHETERS) ×2 IMPLANT
DEVICE RAD COMP TR BAND LRG (VASCULAR PRODUCTS) ×2 IMPLANT
GLIDESHEATH SLEND SS 6F .021 (SHEATH) ×2 IMPLANT
GUIDEWIRE INQWIRE 1.5J.035X260 (WIRE) ×1 IMPLANT
INQWIRE 1.5J .035X260CM (WIRE) ×2
KIT HEART LEFT (KITS) ×2 IMPLANT
PACK CARDIAC CATHETERIZATION (CUSTOM PROCEDURE TRAY) ×2 IMPLANT
SYR MEDRAD MARK 7 150ML (SYRINGE) ×2 IMPLANT
TRANSDUCER W/STOPCOCK (MISCELLANEOUS) ×2 IMPLANT
TUBING CIL FLEX 10 FLL-RA (TUBING) ×2 IMPLANT

## 2018-11-18 NOTE — Discharge Instructions (Signed)
Radial Site Care ° °This sheet gives you information about how to care for yourself after your procedure. Your health care provider may also give you more specific instructions. If you have problems or questions, contact your health care provider. °What can I expect after the procedure? °After the procedure, it is common to have: °· Bruising and tenderness at the catheter insertion area. °Follow these instructions at home: °Medicines °· Take over-the-counter and prescription medicines only as told by your health care provider. °Insertion site care °· Follow instructions from your health care provider about how to take care of your insertion site. Make sure you: °? Wash your hands with soap and water before you change your bandage (dressing). If soap and water are not available, use hand sanitizer. °? Change your dressing as told by your health care provider. °? Leave stitches (sutures), skin glue, or adhesive strips in place. These skin closures may need to stay in place for 2 weeks or longer. If adhesive strip edges start to loosen and curl up, you may trim the loose edges. Do not remove adhesive strips completely unless your health care provider tells you to do that. °· Check your insertion site every day for signs of infection. Check for: °? Redness, swelling, or pain. °? Fluid or blood. °? Pus or a bad smell. °? Warmth. °· Do not take baths, swim, or use a hot tub until your health care provider approves. °· You may shower 24-48 hours after the procedure, or as directed by your health care provider. °? Remove the dressing and gently wash the site with plain soap and water. °? Pat the area dry with a clean towel. °? Do not rub the site. That could cause bleeding. °· Do not apply powder or lotion to the site. °Activity ° °· For 24 hours after the procedure, or as directed by your health care provider: °? Do not flex or bend the affected arm. °? Do not push or pull heavy objects with the affected arm. °? Do not  drive yourself home from the hospital or clinic. You may drive 24 hours after the procedure unless your health care provider tells you not to. °? Do not operate machinery or power tools. °· Do not lift anything that is heavier than 10 lb (4.5 kg), or the limit that you are told, until your health care provider says that it is safe. °· Ask your health care provider when it is okay to: °? Return to work or school. °? Resume usual physical activities or sports. °? Resume sexual activity. °General instructions °· If the catheter site starts to bleed, raise your arm and put firm pressure on the site. If the bleeding does not stop, get help right away. This is a medical emergency. °· If you went home on the same day as your procedure, a responsible adult should be with you for the first 24 hours after you arrive home. °· Keep all follow-up visits as told by your health care provider. This is important. °Contact a health care provider if: °· You have a fever. °· You have redness, swelling, or yellow drainage around your insertion site. °Get help right away if: °· You have unusual pain at the radial site. °· The catheter insertion area swells very fast. °· The insertion area is bleeding, and the bleeding does not stop when you hold steady pressure on the area. °· Your arm or hand becomes pale, cool, tingly, or numb. °These symptoms may represent a serious problem   that is an emergency. Do not wait to see if the symptoms will go away. Get medical help right away. Call your local emergency services (911 in the [place] Do not drive yourself to the hospital. °Summary °· After the procedure, it is common to have bruising and tenderness at the site. °· Follow instructions from your health care provider about how to take care of your radial site wound. Check the wound every day for signs of infection. °· Do not lift anything that is heavier than 10 lb (4.5 kg), or the limit that you are told, until your health care provider says  that it is safe. °This information is not intended to replace advice given to you by your health care provider. Make sure you discuss any questions you have with your health care provider. °Document Released: 10/17/2010 Document Revised: 10/20/2017 Document Reviewed: 10/20/2017 °Elsevier Interactive Patient Education © 2019 [employer] Inc[employer] ° °

## 2018-11-18 NOTE — Interval H&P Note (Signed)
History and Physical Interval Note:  11/18/2018 8:33 AM  Benjamin [name]  has presented today for surgery, with the diagnosis of cad  The various methods of treatment have been discussed with the patient and family. After consideration of risks, benefits and other options for treatment, the patient has consented to  Procedure(s): LEFT HEART CATH AND CORONARY ANGIOGRAPHY (N/A) as a surgical intervention .  The patient's history has been reviewed, patient examined, no change in status, stable for surgery.  I have reviewed the patient's chart and labs.  Questions were answered to the patient's satisfaction.   Cath Lab Visit (complete for each Cath Lab visit)  Clinical Evaluation Leading to the Procedure:   ACS: No.  Non-ACS:    Anginal Classification: CCS II  Anti-ischemic medical therapy: Maximal Therapy (2 or more classes of medications)  Non-Invasive Test Results: Intermediate-risk stress test findings: cardiac mortality 1-3%/year  Prior CABG: No previous CABG        Benjamin [name] [facility] 11/18/2018 8:33 AM

## 2018-11-25 ENCOUNTER — Other Ambulatory Visit: Payer: Self-pay | Admitting: Cardiology

## 2018-12-08 ENCOUNTER — Other Ambulatory Visit: Payer: Self-pay

## 2018-12-08 MED ORDER — LISINOPRIL 20 MG PO TABS: 10.0000 mg | ORAL_TABLET | Freq: Every day | ORAL | 3 refills | Status: DC

## 2018-12-08 NOTE — Telephone Encounter (Signed)
Rx(s) sent to pharmacy electronically.  

## 2018-12-10 NOTE — Progress Notes (Signed)
Derelle De Nurse Date of Birth: June 21, 1946 Medical Record #[id number]  History of Present Illness: Benjamin [name] is seen for follow up CAD. He has a history of coronary disease with prior angioplasty of the proximal LAD in 1991. He also had angioplasty of the circumflex at that time. He has known occlusion of the right coronary with collateral flow. Last Myoview study in October 2015 showed a small anterolateral scar. No ischemia.    On his last visit he described 3 episodes of chest pain for which he took sl Ntg. Lasted 5-10 minutes. No clear triggers. One time he became diaphoretic.  He denies dyspnea or palpitation.  He had a Myoview study showing anterior ischemia. This led to a cardiac cath showing occluded RCA and moderate nonobstructive disease in the LAD and LCx. Unchanged from prior study. Medical therapy recommended.   On follow up he is doing well. No chest pain or dyspnea. No radial site hematoma.  Allergies as of 12/15/2018      Reactions   Sulfa Drugs Cross Reactors Rash      Medication List       Accurate as of December 15, 2018 10:52 AM. Always use your most recent med list.        aspirin EC 81 MG tablet Take 1 tablet (81 mg total) by mouth daily.   atenolol 25 MG tablet Commonly known as:  TENORMIN TAKE 1 TABLET BY MOUTH EVERY DAY   atorvastatin 80 MG tablet Commonly known as:  LIPITOR TAKE 1 TABLET BY MOUTH DAILY   calcium carbonate 500 MG chewable tablet Commonly known as:  TUMS - dosed in mg elemental calcium Chew 2 tablets by mouth at bedtime.   glimepiride 4 MG tablet Commonly known as:  AMARYL Take 4 mg by mouth daily with lunch.   Invokana 300 MG Tabs tablet Generic drug:  canagliflozin Take 300 mg by mouth daily with lunch.   isosorbide mononitrate 60 MG 24 hr tablet Commonly known as:  IMDUR TAKE 1 TABLET BY MOUTH DAILY   Januvia 100 MG tablet Generic drug:  sitaGLIPtin Take 100 mg by mouth daily with lunch.   lisinopril 20 MG tablet Commonly  known as:  PRINIVIL,ZESTRIL Take 0.5 tablets (10 mg total) by mouth daily.   metFORMIN 1000 MG tablet Commonly known as:  GLUCOPHAGE Take 1 tablet (1,000 mg total) by mouth 2 (two) times daily.   nitroGLYCERIN 0.4 MG SL tablet Commonly known as:  NITROSTAT Place 1 tablet (0.4 mg total) under the tongue every 5 (five) minutes as needed.   ranolazine 500 MG 12 hr tablet Commonly known as:  Ranexa Take 1 tablet (500 mg total) by mouth daily.        Allergies  Allergen Reactions  . Sulfa Drugs Cross Reactors Rash    Past Medical History:  Diagnosis Date  . Coronary artery disease    Prior PCI to the prox LAD in 1991; S/P PCI to LCX in October 1991  . Diabetes mellitus    TYPE 2  . History of kidney stones    many yrs ago  . Hyperlipidemia   . Tobacco abuse     Past Surgical History:  Procedure Laterality Date  . COLONOSCOPY WITH PROPOFOL N/A 04/21/2016   Procedure: COLONOSCOPY WITH PROPOFOL;  Surgeon: Garlan Fair, MD;  Location: [facility];  Service: Endoscopy;  Laterality: N/A;  . CORONARY ANGIOPLASTY  05/1990   PROXIMAL LAD  . CORONARY ANGIOPLASTY  06/1990   LEFT CIRCUMFLEX CORONARY  .  LEFT HEART CATH AND CORONARY ANGIOGRAPHY N/A 11/18/2018   Procedure: LEFT HEART CATH AND CORONARY ANGIOGRAPHY;  Surgeon: Martinique, Arieh Bogue M, MD;  Location: [facility];  Service: Cardiovascular;  Laterality: N/A;  . UMBILICAL HERNIA REPAIR      Social History   Tobacco Use  Smoking Status Current Every Day Smoker  . Packs/day: 1.00  . Years: 54.00  . Pack years: 54.00  . Types: Cigarettes  Smokeless Tobacco Never Used    Social History   Substance and Sexual Activity  Alcohol Use Yes   Comment: every other week    Family History  Problem Relation Age of Onset  . Heart disease Sister        had open heart surgery    Review of Systems: As noted in history of present illness.  All other systems were reviewed and are negative.  Physical Exam: BP 106/65    Pulse 79   Ht 5\' 4"  (1.626 m)   Wt 144 lb 3.2 oz (65.4 kg)   BMI 24.75 kg/m  GENERAL:  Well appearing WM in NAD HEENT:  PERRL, EOMI, sclera are clear. Oropharynx is clear. NECK:  No jugular venous distention, carotid upstroke brisk and symmetric, no bruits, no thyromegaly or adenopathy LUNGS:  Clear to auscultation bilaterally CHEST:  Unremarkable HEART:  RRR,  PMI not displaced or sustained,S1 and S2 within normal limits, no S3, no S4: no clicks, no rubs, no murmurs ABD:  Soft, nontender. BS +, no masses or bruits. No hepatomegaly, no splenomegaly EXT:  2 + pulses throughout, no edema, no cyanosis no clubbing. Radial cath site has healed well. SKIN:  Warm and dry.  No rashes NEURO:  Alert and oriented x 3. Cranial nerves II through XII intact. PSYCH:  Cognitively intact         LABORATORY DATA:  Lab Results  Component Value Date   WBC 8.8 11/14/2018   HGB 14.6 11/14/2018   HCT 43.9 11/14/2018   PLT 167 11/14/2018   GLUCOSE 262 (H) 11/14/2018   NA 136 11/14/2018   K 4.9 11/14/2018   CL 100 11/14/2018   CREATININE 0.98 11/14/2018   BUN 16 11/14/2018   CO2 19 (L) 11/14/2018   Labs dated 08/05/16: cholesterol 115, triglycerides 56, LDL 49, HDL 56. A1c 7.4%. CMET normal. Dated 08/30/17: cholesterol 120, triglycerides 58, HDL 46, LDL 62. A1c 7.8%. Chemistries and CBC normal.  Dated 09/16/18: cholesterol 106, triglycerides 51, HDL 48, LDL48. A1c 7.4%. CMET normal   Myoview 11/09/18: Study Highlights     The left ventricular ejection fraction is normal (55-65%).  Nuclear stress EF: 59%.  Blood pressure demonstrated a normal response to exercise.  There was no ST segment deviation noted during stress.  No T wave inversion was noted during stress.  Defect 1: There is a small defect of moderate severity present in the mid anterolateral, apical lateral and apex location.  Findings consistent with ischemia.  This is an intermediate risk study.   The anterolateral  perfusion defect on the prior exam was interpreted as fixed, however on today's exam appears to have reversible ischemia. Wall motion appears abnormal in this region.    Cardiac cath 11/18/18:  LEFT HEART CATH AND CORONARY ANGIOGRAPHY  Conclusion     Prox LAD lesion is 45% stenosed.  Prox LAD to Mid LAD lesion is 65% stenosed.  Ost Cx to Prox Cx lesion is 50% stenosed.  Prox Cx to Mid Cx lesion is 50% stenosed.  Prox RCA  to Dist RCA lesion is 100% stenosed.  The left ventricular systolic function is normal.  LV end diastolic pressure is normal.  The left ventricular ejection fraction is 55-65% by visual estimate.   1. Single vessel occlusive CAD with CTO of the RCA. Left to right collaterals. There is diffuse moderate calcific disease involving the LAD and LCx that is not significantly changed since 2011. 2. Normal LV function 3. Normal LV EDP  Plan: continued medical therapy     Assessment / Plan: 1. Coronary disease with remote angioplasty of the LAD and left circumflex coronary is in 1991. Chronic total occlusion of the right coronary. Recent repeat evaluation with cardiac cath in February was stable.  Ntg. He is on maximal antianginal therapy with  aspirin, atenolol, and isosorbide. Continue Ranexa. Focus on risk factor modification.  2. Hyperlipidemia. Continue on high-dose atorvastatin. Excellent control.  3. Diabetes mellitus type 2. Still suboptimal control on metformin, Invokana, glimeperide, and Januvia. Needs to focus more on lifestyle modification with dietary therapy. Focus on eliminating sweets and sodas.   4. Tobacco abuse. Patient counseled on smoking cessation.

## 2018-12-14 ENCOUNTER — Other Ambulatory Visit: Payer: Self-pay | Admitting: Cardiology

## 2018-12-15 ENCOUNTER — Encounter: Payer: Self-pay | Admitting: Cardiology

## 2018-12-15 ENCOUNTER — Ambulatory Visit: Payer: Medicare Other | Admitting: Cardiology

## 2018-12-15 VITALS — BP 106/65 | HR 79 | Ht 64.0 in | Wt 144.2 lb

## 2018-12-15 DIAGNOSIS — E119 Type 2 diabetes mellitus without complications: Secondary | ICD-10-CM | POA: Diagnosis not present

## 2018-12-15 DIAGNOSIS — I25119 Atherosclerotic heart disease of native coronary artery with unspecified angina pectoris: Secondary | ICD-10-CM | POA: Diagnosis not present

## 2018-12-15 DIAGNOSIS — I209 Angina pectoris, unspecified: Secondary | ICD-10-CM | POA: Diagnosis not present

## 2018-12-15 DIAGNOSIS — E7800 Pure hypercholesterolemia, unspecified: Secondary | ICD-10-CM | POA: Diagnosis not present

## 2019-04-05 ENCOUNTER — Other Ambulatory Visit: Payer: Self-pay

## 2019-04-05 DIAGNOSIS — I25119 Atherosclerotic heart disease of native coronary artery with unspecified angina pectoris: Secondary | ICD-10-CM

## 2019-04-05 MED ORDER — RANOLAZINE ER 500 MG PO TB12: 500.0000 mg | ORAL_TABLET | Freq: Every day | ORAL | 2 refills | Status: DC

## 2019-04-05 NOTE — Telephone Encounter (Signed)
Rx(s) sent to pharmacy electronically.  

## 2019-09-11 NOTE — Progress Notes (Signed)
Hagan De Nurse Date of Birth: Jan 26, 1946 Medical Record [id number]  History of Present Illness: Benjamin [name] is seen for follow up CAD. He has a history of coronary disease with prior angioplasty of the proximal LAD in 1991. He also had angioplasty of the circumflex at that time. He has known occlusion of the right coronary with collateral flow. Last Myoview study in October 2015 showed a small anterolateral scar. No ischemia.    On his last visit he described 3 episodes of chest pain for which he took sl Ntg. Lasted 5-10 minutes. No clear triggers. One time he became diaphoretic.  He denies dyspnea or palpitation.  He had a Myoview study showing anterior ischemia. This led to a cardiac cath showing occluded RCA and moderate nonobstructive disease in the LAD and LCx. Unchanged from prior study. Medical therapy recommended.   On follow up he is doing well. He states he has had no further anginal episodes. He is exercising a fair amount around the house. Still smoking but not as much as before.   Allergies as of 09/14/2019      Reactions   Sulfa Drugs Cross Reactors Rash      Medication List       Accurate as of September 14, 2019  9:53 AM. If you have any questions, ask your nurse or doctor.        aspirin EC 81 MG tablet Take 1 tablet (81 mg total) by mouth daily. What changed: when to take this   atenolol 25 MG tablet Commonly known as: TENORMIN TAKE 1 TABLET BY MOUTH EVERY DAY   atorvastatin 80 MG tablet Commonly known as: LIPITOR TAKE 1 TABLET BY MOUTH DAILY   calcium carbonate 500 MG chewable tablet Commonly known as: TUMS - dosed in mg elemental calcium Chew 2 tablets by mouth at bedtime.   glimepiride 4 MG tablet Commonly known as: AMARYL Take 4 mg by mouth daily with lunch.   Invokana 300 MG Tabs tablet Generic drug: canagliflozin Take 300 mg by mouth daily with lunch.   isosorbide mononitrate 60 MG 24 hr tablet Commonly known as: IMDUR TAKE 1 TABLET BY MOUTH  DAILY   Januvia 100 MG tablet Generic drug: sitaGLIPtin Take 100 mg by mouth daily with lunch.   lisinopril 20 MG tablet Commonly known as: ZESTRIL Take 0.5 tablets (10 mg total) by mouth daily.   metFORMIN 1000 MG tablet Commonly known as: GLUCOPHAGE Take 1 tablet (1,000 mg total) by mouth 2 (two) times daily.   nitroGLYCERIN 0.4 MG SL tablet Commonly known as: NITROSTAT Place 1 tablet (0.4 mg total) under the tongue every 5 (five) minutes as needed. What changed: reasons to take this   ranolazine 500 MG 12 hr tablet Commonly known as: Ranexa Take 1 tablet (500 mg total) by mouth daily.        Allergies  Allergen Reactions  . Sulfa Drugs Cross Reactors Rash    Past Medical History:  Diagnosis Date  . Coronary artery disease    Prior PCI to the prox LAD in 1991; S/P PCI to LCX in October 1991  . Diabetes mellitus    TYPE 2  . History of kidney stones    many yrs ago  . Hyperlipidemia   . Tobacco abuse     Past Surgical History:  Procedure Laterality Date  . COLONOSCOPY WITH PROPOFOL N/A 04/21/2016   Procedure: COLONOSCOPY WITH PROPOFOL;  Surgeon: Garlan Fair, MD;  Location: [facility];  Service: Endoscopy;  Laterality: N/A;  . CORONARY ANGIOPLASTY  05/1990   PROXIMAL LAD  . CORONARY ANGIOPLASTY  06/1990   LEFT CIRCUMFLEX CORONARY  . LEFT HEART CATH AND CORONARY ANGIOGRAPHY N/A 11/18/2018   Procedure: LEFT HEART CATH AND CORONARY ANGIOGRAPHY;  Surgeon: Martinique, Brandonlee Navis M, MD;  Location: [place] [facility];  Service: Cardiovascular;  Laterality: N/A;  . UMBILICAL HERNIA REPAIR      Social History   Tobacco Use  Smoking Status Current Every Day Smoker  . Packs/day: 1.00  . Years: 54.00  . Pack years: 54.00  . Types: Cigarettes  Smokeless Tobacco Never Used    Social History   Substance and Sexual Activity  Alcohol Use Yes   Comment: every other week    Family History  Problem Relation Age of Onset  . Heart disease Sister        had open  heart surgery    Review of Systems: As noted in history of present illness.  All other systems were reviewed and are negative.  Physical Exam: BP 113/61   Pulse (!) 53   Ht 5' 4.5" (1.638 m)   Wt 138 lb (62.6 kg)   SpO2 99%   BMI 23.32 kg/m  GENERAL:  Well appearing WM in NAD HEENT:  PERRL, EOMI, sclera are clear. Oropharynx is clear. NECK:  No jugular venous distention, carotid upstroke brisk and symmetric, no bruits, no thyromegaly or adenopathy LUNGS:  Clear to auscultation bilaterally CHEST:  Unremarkable HEART:  RRR,  PMI not displaced or sustained,S1 and S2 within normal limits, no S3, no S4: no clicks, no rubs, no murmurs ABD:  Soft, nontender. BS +, no masses or bruits. No hepatomegaly, no splenomegaly EXT:  2 + pulses throughout, no edema, no cyanosis no clubbing. ll. SKIN:  Warm and dry.  No rashes NEURO:  Alert and oriented x 3. Cranial nerves II through XII intact. PSYCH:  Cognitively intact   LABORATORY DATA:  Lab Results  Component Value Date   WBC 8.8 11/14/2018   HGB 14.6 11/14/2018   HCT 43.9 11/14/2018   PLT 167 11/14/2018   GLUCOSE 262 (H) 11/14/2018   NA 136 11/14/2018   K 4.9 11/14/2018   CL 100 11/14/2018   CREATININE 0.98 11/14/2018   BUN 16 11/14/2018   CO2 19 (L) 11/14/2018   Labs dated 08/05/16: cholesterol 115, triglycerides 56, LDL 49, HDL 56. A1c 7.4%. CMET normal. Dated 08/30/17: cholesterol 120, triglycerides 58, HDL 46, LDL 62. A1c 7.8%. Chemistries and CBC normal.  Dated 09/16/18: cholesterol 106, triglycerides 51, HDL 48, LDL48. A1c 7.4%. CMET normal Dated 03/27/19: A1c 7.5%. BMET normal   Myoview 11/09/18: Study Highlights     The left ventricular ejection fraction is normal (55-65%).  Nuclear stress EF: 59%.  Blood pressure demonstrated a normal response to exercise.  There was no ST segment deviation noted during stress.  No T wave inversion was noted during stress.  Defect 1: There is a small defect of moderate severity  present in the mid anterolateral, apical lateral and apex location.  Findings consistent with ischemia.  This is an intermediate risk study.   The anterolateral perfusion defect on the prior exam was interpreted as fixed, however on today's exam appears to have reversible ischemia. Wall motion appears abnormal in this region.    Cardiac cath 11/18/18:  LEFT HEART CATH AND CORONARY ANGIOGRAPHY  Conclusion     Prox LAD lesion is 45% stenosed.  Prox LAD to Mid LAD lesion is 65% stenosed.  Ost Cx to Prox Cx lesion is 50% stenosed.  Prox Cx to Mid Cx lesion is 50% stenosed.  Prox RCA to Dist RCA lesion is 100% stenosed.  The left ventricular systolic function is normal.  LV end diastolic pressure is normal.  The left ventricular ejection fraction is 55-65% by visual estimate.   1. Single vessel occlusive CAD with CTO of the RCA. Left to right collaterals. There is diffuse moderate calcific disease involving the LAD and LCx that is not significantly changed since 2011. 2. Normal LV function 3. Normal LV EDP  Plan: continued medical therapy     Assessment / Plan: 1. Coronary disease with remote angioplasty of the LAD and left circumflex coronary is in 1991. Chronic total occlusion of the right coronary. Repeat evaluation with cardiac cath in February was stable.  He is on maximal antianginal therapy with  aspirin, atenolol, and isosorbide. Continue Ranexa. Focus on risk factor modification. Ntg prn.  2. Hyperlipidemia. Continue on high-dose atorvastatin. Excellent control. Scheduled for follow up lab work in Jan. With primary care.   3. Diabetes mellitus type 2. Still suboptimal control on metformin, Invokana, glimeperide, and Januvia.  Focus on eliminating sweets and sodas. Management per primary care.   4. Tobacco abuse. Patient counseled on smoking cessation. No plans to quit

## 2019-09-14 ENCOUNTER — Encounter: Payer: Self-pay | Admitting: Cardiology

## 2019-09-14 ENCOUNTER — Ambulatory Visit: Payer: Medicare Other | Admitting: Cardiology

## 2019-09-14 ENCOUNTER — Other Ambulatory Visit: Payer: Self-pay

## 2019-09-14 ENCOUNTER — Encounter (INDEPENDENT_AMBULATORY_CARE_PROVIDER_SITE_OTHER): Payer: Self-pay

## 2019-09-14 VITALS — BP 113/61 | HR 53 | Ht 64.5 in | Wt 138.0 lb

## 2019-09-14 DIAGNOSIS — I209 Angina pectoris, unspecified: Secondary | ICD-10-CM | POA: Diagnosis not present

## 2019-09-14 DIAGNOSIS — E7800 Pure hypercholesterolemia, unspecified: Secondary | ICD-10-CM

## 2019-09-14 DIAGNOSIS — I25119 Atherosclerotic heart disease of native coronary artery with unspecified angina pectoris: Secondary | ICD-10-CM | POA: Diagnosis not present

## 2019-09-14 DIAGNOSIS — E119 Type 2 diabetes mellitus without complications: Secondary | ICD-10-CM

## 2019-09-14 DIAGNOSIS — Z720 Tobacco use: Secondary | ICD-10-CM

## 2019-11-12 ENCOUNTER — Ambulatory Visit: Payer: Medicare Other | Attending: Internal Medicine

## 2019-11-12 DIAGNOSIS — Z23 Encounter for immunization: Secondary | ICD-10-CM

## 2019-11-12 NOTE — Progress Notes (Signed)
   Covid-19 Vaccination Clinic  Name:  Benjamin [name]    MRN: [id number] DOB: 1946-06-09  11/12/2019  [name] was observed post Covid-19 immunization for 15 minutes without incidence. He was provided with Vaccine Information Sheet and instruction to access the V-Safe system.   [name] was instructed to call 911 with any severe reactions post vaccine: Marland Kitchen Difficulty breathing  . Swelling of your face and throat  . A fast heartbeat  . A bad rash all over your body  . Dizziness and weakness    Immunizations Administered    Name Date Dose VIS Date Route   Pfizer COVID-19 Vaccine 11/12/2019  9:39 AM 0.3 mL 09/08/2019 Intramuscular   Manufacturer: [place]   Lot: [id number]   [place]: [id number]

## 2019-12-01 ENCOUNTER — Other Ambulatory Visit: Payer: Self-pay

## 2019-12-01 MED ORDER — LISINOPRIL 20 MG PO TABS: 10.0000 mg | ORAL_TABLET | Freq: Every day | ORAL | 3 refills | Status: DC

## 2019-12-01 MED ORDER — ATENOLOL 25 MG PO TABS: 25.0000 mg | ORAL_TABLET | Freq: Every day | ORAL | 3 refills | Status: DC

## 2019-12-05 ENCOUNTER — Ambulatory Visit: Payer: Medicare Other | Attending: Internal Medicine

## 2019-12-05 DIAGNOSIS — Z23 Encounter for immunization: Secondary | ICD-10-CM | POA: Insufficient documentation

## 2019-12-05 NOTE — Progress Notes (Signed)
   Covid-19 Vaccination Clinic  Name:  Benjamin [name]    MRN: [id number] DOB: [birth year]  12/05/2019  Benjamin [name] was observed post Covid-19 immunization for 15 minutes without incident. He was provided with Vaccine Information Sheet and instruction to access the V-Safe system.   Benjamin [name] was instructed to call 911 with any severe reactions post vaccine: Marland Kitchen Difficulty breathing  . Swelling of face and throat  . A fast heartbeat  . A bad rash all over body  . Dizziness and weakness   Immunizations Administered    Name Date Dose VIS Date Route   Pfizer COVID-19 Vaccine 12/05/2019  9:47 AM 0.3 mL 09/08/2019 Intramuscular   Manufacturer: [place]   Lot: [id number]   [place]: [id number]

## 2019-12-27 ENCOUNTER — Other Ambulatory Visit: Payer: Self-pay | Admitting: Cardiology

## 2019-12-29 ENCOUNTER — Other Ambulatory Visit: Payer: Self-pay

## 2019-12-29 DIAGNOSIS — I25119 Atherosclerotic heart disease of native coronary artery with unspecified angina pectoris: Secondary | ICD-10-CM

## 2019-12-29 MED ORDER — RANOLAZINE ER 500 MG PO TB12: 500.0000 mg | ORAL_TABLET | Freq: Every day | ORAL | 2 refills | Status: DC

## 2020-04-20 NOTE — Progress Notes (Signed)
Benjamin [name] Date of Birth: [birth year] Medical Record #[id number]  History of Present Illness: Benjamin [name] is seen for follow up CAD. He has a history of coronary disease with prior angioplasty of the proximal LAD in 1991. He also had angioplasty of the circumflex at that time. He has known occlusion of the right coronary with collateral flow. Last Myoview study in October 2015 showed a small anterolateral scar. No ischemia.    On a prior  visit he described 3 episodes of chest pain for which he took sl Ntg. Lasted 5-10 minutes. No clear triggers. One time he became diaphoretic.  He denies dyspnea or palpitation.  He had a Myoview study showing anterior ischemia. This led to a cardiac cath in February 2020 showing occluded RCA and moderate nonobstructive disease in the LAD and LCx. Unchanged from prior study. Medical therapy recommended.   On follow up he is doing well. He states he had one episode of angina 2weeks ago relieved with sl Ntg. He is drinking less. Still smoking.He is exercising a fair amount around the house. .    Allergies as of 04/24/2020      Reactions   Sulfa Drugs Cross Reactors Rash      Medication List       Accurate as of April 24, 2020  3:52 PM. If you have any questions, ask your [name] or doctor.        aspirin EC 81 MG tablet Take 1 tablet (81 mg total) by mouth daily. What changed: when to take this   atenolol 25 MG tablet Commonly known as: TENORMIN Take 1 tablet (25 mg total) by mouth daily.   atorvastatin 80 MG tablet Commonly known as: LIPITOR TAKE 1 TABLET BY MOUTH DAILY   calcium carbonate 500 MG chewable tablet Commonly known as: TUMS - dosed in mg elemental calcium Chew 2 tablets by mouth at bedtime.   glimepiride 4 MG tablet Commonly known as: AMARYL Take 4 mg by mouth daily with lunch.   Invokana 300 MG Tabs tablet Generic drug: canagliflozin Take 300 mg by mouth daily with lunch.   isosorbide mononitrate 60 MG 24 hr tablet Commonly  known as: IMDUR TAKE 1 TABLET BY MOUTH DAILY   Januvia 100 MG tablet Generic drug: sitaGLIPtin Take 100 mg by mouth daily with lunch.   lisinopril 20 MG tablet Commonly known as: ZESTRIL Take 0.5 tablets (10 mg total) by mouth daily.   metFORMIN 1000 MG tablet Commonly known as: GLUCOPHAGE Take 1 tablet (1,000 mg total) by mouth 2 (two) times daily.   nitroGLYCERIN 0.4 MG SL tablet Commonly known as: NITROSTAT Place 1 tablet (0.4 mg total) under the tongue every 5 (five) minutes as needed. What changed: reasons to take this   ranolazine 500 MG 12 hr tablet Commonly known as: Ranexa Take 1 tablet (500 mg total) by mouth daily.        Allergies  Allergen Reactions  . Sulfa Drugs Cross Reactors Rash    Past Medical History:  Diagnosis Date  . Coronary artery disease    Prior PCI to the prox LAD in 1991; S/P PCI to LCX in October 1991  . Diabetes mellitus    TYPE 2  . History of kidney stones    many yrs ago  . Hyperlipidemia   . Tobacco abuse     Past Surgical History:  Procedure Laterality Date  . COLONOSCOPY WITH PROPOFOL N/A 04/21/2016   Procedure: COLONOSCOPY WITH PROPOFOL;  Surgeon: Ursula Alert  Wynetta Emery, MD;  Location: [facility];  Service: Endoscopy;  Laterality: N/A;  . CORONARY ANGIOPLASTY  05/1990   PROXIMAL LAD  . CORONARY ANGIOPLASTY  06/1990   LEFT CIRCUMFLEX CORONARY  . LEFT HEART CATH AND CORONARY ANGIOGRAPHY N/A 11/18/2018   Procedure: LEFT HEART CATH AND CORONARY ANGIOGRAPHY;  Surgeon: Martinique, Bennette Hasty M, MD;  Location: [place] [facility];  Service: Cardiovascular;  Laterality: N/A;  . UMBILICAL HERNIA REPAIR      Social History   Tobacco Use  Smoking Status Current Every Day Smoker  . Packs/day: 1.00  . Years: 54.00  . Pack years: 54.00  . Types: Cigarettes  Smokeless Tobacco Never Used    Social History   Substance and Sexual Activity  Alcohol Use Yes   Comment: every other week    Family History  Problem Relation Age of Onset  .  Heart disease Sister        had open heart surgery    Review of Systems: As noted in history of present illness.  All other systems were reviewed and are negative.  Physical Exam: BP (!) 110/64   Pulse 63   Temp (!) 97 F (36.1 C)   Ht 5\' 4"  (1.626 m)   Wt 137 lb (62.1 kg)   SpO2 95%   BMI 23.52 kg/m  GENERAL:  Well appearing WM in NAD HEENT:  PERRL, EOMI, sclera are clear. Oropharynx is clear. NECK:  No jugular venous distention, carotid upstroke brisk and symmetric, no bruits, no thyromegaly or adenopathy LUNGS:  Clear to auscultation bilaterally CHEST:  Unremarkable HEART:  RRR,  PMI not displaced or sustained,S1 and S2 within normal limits, no S3, no S4: no clicks, no rubs, no murmurs ABD:  Soft, nontender. BS +, no masses or bruits. No hepatomegaly, no splenomegaly EXT:  2 + pulses throughout, no edema, no cyanosis no clubbing. ll. SKIN:  Warm and dry.  No rashes NEURO:  Alert and oriented x 3. Cranial nerves II through XII intact. PSYCH:  Cognitively intact   LABORATORY DATA:  Lab Results  Component Value Date   WBC 8.8 11/14/2018   HGB 14.6 11/14/2018   HCT 43.9 11/14/2018   PLT 167 11/14/2018   GLUCOSE 262 (H) 11/14/2018   NA 136 11/14/2018   K 4.9 11/14/2018   CL 100 11/14/2018   CREATININE 0.98 11/14/2018   BUN 16 11/14/2018   CO2 19 (L) 11/14/2018   Labs dated 08/05/16: cholesterol 115, triglycerides 56, LDL 49, HDL 56. A1c 7.4%. CMET normal. Dated 08/30/17: cholesterol 120, triglycerides 58, HDL 46, LDL 62. A1c 7.8%. Chemistries and CBC normal.  Dated 09/16/18: cholesterol 106, triglycerides 51, HDL 48, LDL48. A1c 7.4%. CMET normal Dated 03/27/19: A1c 7.5%. BMET normal Dated 10/19/19: cholesterol 119, triglycerides 52, HDL 51, LDL 56, CMET normal Dated 04/10/20: A1c 6.8%.   Ecg today shows NSR rate 63 with nonspecific ST abnormality. I have personally reviewed and interpreted this study.   Myoview 11/09/18: Study Highlights     The left ventricular  ejection fraction is normal (55-65%).  Nuclear stress EF: 59%.  Blood pressure demonstrated a normal response to exercise.  There was no ST segment deviation noted during stress.  No T wave inversion was noted during stress.  Defect 1: There is a small defect of moderate severity present in the mid anterolateral, apical lateral and apex location.  Findings consistent with ischemia.  This is an intermediate risk study.   The anterolateral perfusion defect on the prior exam was  interpreted as fixed, however on today's exam appears to have reversible ischemia. Wall motion appears abnormal in this region.    Cardiac cath 11/18/18:  LEFT HEART CATH AND CORONARY ANGIOGRAPHY  Conclusion     Prox LAD lesion is 45% stenosed.  Prox LAD to Mid LAD lesion is 65% stenosed.  Ost Cx to Prox Cx lesion is 50% stenosed.  Prox Cx to Mid Cx lesion is 50% stenosed.  Prox RCA to Dist RCA lesion is 100% stenosed.  The left ventricular systolic function is normal.  LV end diastolic pressure is normal.  The left ventricular ejection fraction is 55-65% by visual estimate.   1. Single vessel occlusive CAD with CTO of the RCA. Left to right collaterals. There is diffuse moderate calcific disease involving the LAD and LCx that is not significantly changed since 2011. 2. Normal LV function 3. Normal LV EDP  Plan: continued medical therapy     Assessment / Plan: 1. Coronary disease with remote angioplasty of the LAD and left circumflex coronary is in 1991. Chronic total occlusion of the right coronary. Repeat evaluation with cardiac cath in February 2020 was stable.  He is on maximal antianginal therapy with  aspirin, atenolol, and isosorbide. Continue Ranexa. Focus on risk factor modification. Ntg prn.  2. Hyperlipidemia. Continue on high-dose atorvastatin. Excellent control.   3. Diabetes mellitus type 2. Still suboptimal control on metformin, Invokana, glimeperide, and Januvia.  Focus on  eliminating sweets and sodas. Management per primary care.   4. Tobacco abuse. Patient counseled on smoking cessation. No plans to quit  Follow up in 6 months.

## 2020-04-24 ENCOUNTER — Ambulatory Visit: Payer: Medicare Other | Admitting: Cardiology

## 2020-04-24 ENCOUNTER — Other Ambulatory Visit: Payer: Self-pay

## 2020-04-24 ENCOUNTER — Encounter: Payer: Self-pay | Admitting: Cardiology

## 2020-04-24 VITALS — BP 110/64 | HR 63 | Temp 97.0°F | Ht 64.0 in | Wt 137.0 lb

## 2020-04-24 DIAGNOSIS — I25119 Atherosclerotic heart disease of native coronary artery with unspecified angina pectoris: Secondary | ICD-10-CM

## 2020-04-24 DIAGNOSIS — I209 Angina pectoris, unspecified: Secondary | ICD-10-CM

## 2020-04-24 DIAGNOSIS — Z720 Tobacco use: Secondary | ICD-10-CM | POA: Diagnosis not present

## 2020-04-24 DIAGNOSIS — E7800 Pure hypercholesterolemia, unspecified: Secondary | ICD-10-CM

## 2020-09-23 ENCOUNTER — Other Ambulatory Visit: Payer: Self-pay

## 2020-09-23 DIAGNOSIS — I25119 Atherosclerotic heart disease of native coronary artery with unspecified angina pectoris: Secondary | ICD-10-CM

## 2020-09-23 MED ORDER — RANOLAZINE ER 500 MG PO TB12: 500.0000 mg | ORAL_TABLET | Freq: Every day | ORAL | 3 refills | Status: DC

## 2020-10-10 DIAGNOSIS — E7800 Pure hypercholesterolemia, unspecified: Secondary | ICD-10-CM | POA: Diagnosis not present

## 2020-10-10 DIAGNOSIS — E113293 Type 2 diabetes mellitus with mild nonproliferative diabetic retinopathy without macular edema, bilateral: Secondary | ICD-10-CM | POA: Diagnosis not present

## 2020-10-10 DIAGNOSIS — Z23 Encounter for immunization: Secondary | ICD-10-CM | POA: Diagnosis not present

## 2020-10-10 DIAGNOSIS — I10 Essential (primary) hypertension: Secondary | ICD-10-CM | POA: Diagnosis not present

## 2020-10-10 DIAGNOSIS — I2510 Atherosclerotic heart disease of native coronary artery without angina pectoris: Secondary | ICD-10-CM | POA: Diagnosis not present

## 2020-10-18 NOTE — Progress Notes (Signed)
Benjamin [name] Date of Birth: Nov 04, 1945 Medical Record #[id number]  History of Present Illness: Benjamin [name] is seen for follow up CAD. He has a history of coronary disease with prior angioplasty of the proximal LAD in 1991. He also had angioplasty of the circumflex at that time. He has known occlusion of the right coronary with collateral flow. Last Myoview study in October 2015 showed a small anterolateral scar. No ischemia.    On a prior  visit he described 3 episodes of chest pain for which he took sl Ntg. Lasted 5-10 minutes. No clear triggers. One time he became diaphoretic.  He denies dyspnea or palpitation.  He had a Myoview study showing anterior ischemia. This led to a cardiac cath in February 2020 showing occluded RCA and moderate nonobstructive disease in the LAD and LCx. Unchanged from prior study. Medical therapy recommended.   He is seen today with his wife. On Jan 1 he experienced some chest pain. Noted HR increase to 154 on pulse ox.  Took sl Ntg and symptoms went away. This scared him and he finally decided to quit smoking. Since quitting he reports feeling 100% better. His chronic cough has resolved. Breathing is better. No further chest pain. Wife does report that he snores a lot and has episodes of apnea. Concerned about sleep apnea.   Allergies as of 10/22/2020      Reactions   Sulfa Drugs Cross Reactors Rash      Medication List       Accurate as of October 22, 2020  3:34 PM. If you have any questions, ask your [name] or doctor.        aspirin EC 81 MG tablet Take 1 tablet (81 mg total) by mouth daily. What changed: when to take this   atenolol 25 MG tablet Commonly known as: TENORMIN Take 1 tablet (25 mg total) by mouth daily.   atorvastatin 80 MG tablet Commonly known as: LIPITOR TAKE 1 TABLET BY MOUTH DAILY   calcium carbonate 500 MG chewable tablet Commonly known as: TUMS - dosed in mg elemental calcium Chew 2 tablets by mouth at bedtime.   glimepiride  4 MG tablet Commonly known as: AMARYL Take 4 mg by mouth daily with lunch.   Invokana 300 MG Tabs tablet Generic drug: canagliflozin Take 300 mg by mouth daily with lunch.   isosorbide mononitrate 60 MG 24 hr tablet Commonly known as: IMDUR TAKE 1 TABLET BY MOUTH DAILY   Januvia 100 MG tablet Generic drug: sitaGLIPtin Take 100 mg by mouth daily with lunch.   lisinopril 20 MG tablet Commonly known as: ZESTRIL Take 0.5 tablets (10 mg total) by mouth daily.   metFORMIN 1000 MG tablet Commonly known as: GLUCOPHAGE Take 1 tablet (1,000 mg total) by mouth 2 (two) times daily.   nitroGLYCERIN 0.4 MG SL tablet Commonly known as: NITROSTAT Place 1 tablet (0.4 mg total) under the tongue every 5 (five) minutes as needed. What changed: reasons to take this   ranolazine 500 MG 12 hr tablet Commonly known as: Ranexa Take 1 tablet (500 mg total) by mouth daily.        Allergies  Allergen Reactions  . Sulfa Drugs Cross Reactors Rash    Past Medical History:  Diagnosis Date  . Coronary artery disease    Prior PCI to the prox LAD in 1991; S/P PCI to LCX in October 1991  . Diabetes mellitus    TYPE 2  . History of kidney stones  many yrs ago  . Hyperlipidemia   . Tobacco abuse     Past Surgical History:  Procedure Laterality Date  . COLONOSCOPY WITH PROPOFOL N/A 04/21/2016   Procedure: COLONOSCOPY WITH PROPOFOL;  Surgeon: Garlan Fair, MD;  Location: [facility];  Service: Endoscopy;  Laterality: N/A;  . CORONARY ANGIOPLASTY  05/1990   PROXIMAL LAD  . CORONARY ANGIOPLASTY  06/1990   LEFT CIRCUMFLEX CORONARY  . LEFT HEART CATH AND CORONARY ANGIOGRAPHY N/A 11/18/2018   Procedure: LEFT HEART CATH AND CORONARY ANGIOGRAPHY;  Surgeon: Martinique, Basim Bartnik M, MD;  Location: [place] [facility];  Service: Cardiovascular;  Laterality: N/A;  . UMBILICAL HERNIA REPAIR      Social History   Tobacco Use  Smoking Status Current Every Day Smoker  . Packs/day: 1.00  . Years: 54.00   . Pack years: 54.00  . Types: Cigarettes  Smokeless Tobacco Never Used    Social History   Substance and Sexual Activity  Alcohol Use Yes   Comment: every other week    Family History  Problem Relation Age of Onset  . Heart disease Sister        had open heart surgery    Review of Systems: As noted in history of present illness.  All other systems were reviewed and are negative.  Physical Exam: BP 124/84   Pulse (!) 54   Ht 5\' 4"  (1.626 m)   Wt 146 lb (66.2 kg)   SpO2 93%   BMI 25.06 kg/m  GENERAL:  Well appearing WM in NAD HEENT:  PERRL, EOMI, sclera are clear. Oropharynx is clear. NECK:  No jugular venous distention, carotid upstroke brisk and symmetric, no bruits, no thyromegaly or adenopathy LUNGS:  Clear to auscultation bilaterally CHEST:  Unremarkable HEART:  RRR,  PMI not displaced or sustained,S1 and S2 within normal limits, no S3, no S4: no clicks, no rubs, no murmurs ABD:  Soft, nontender. BS +, no masses or bruits. No hepatomegaly, no splenomegaly EXT:  2 + pulses throughout, no edema, no cyanosis no clubbing. ll. SKIN:  Warm and dry.  No rashes NEURO:  Alert and oriented x 3. Cranial nerves II through XII intact. PSYCH:  Cognitively intact   LABORATORY DATA:  Lab Results  Component Value Date   WBC 8.8 11/14/2018   HGB 14.6 11/14/2018   HCT 43.9 11/14/2018   PLT 167 11/14/2018   GLUCOSE 262 (H) 11/14/2018   NA 136 11/14/2018   K 4.9 11/14/2018   CL 100 11/14/2018   CREATININE 0.98 11/14/2018   BUN 16 11/14/2018   CO2 19 (L) 11/14/2018   Labs dated 08/05/16: cholesterol 115, triglycerides 56, LDL 49, HDL 56. A1c 7.4%. CMET normal. Dated 08/30/17: cholesterol 120, triglycerides 58, HDL 46, LDL 62. A1c 7.8%. Chemistries and CBC normal.  Dated 09/16/18: cholesterol 106, triglycerides 51, HDL 48, LDL48. A1c 7.4%. CMET normal Dated 03/27/19: A1c 7.5%. BMET normal Dated 10/19/19: cholesterol 119, triglycerides 52, HDL 51, LDL 56, CMET normal Dated  04/10/20: A1c 6.8%.  Dated 10/10/20: cholesterol 123, triglycerides 56, HDL 55, LDL 56. A1c 7.1%. CMET normal.    Myoview 11/09/18: Study Highlights     The left ventricular ejection fraction is normal (55-65%).  Nuclear stress EF: 59%.  Blood pressure demonstrated a normal response to exercise.  There was no ST segment deviation noted during stress.  No T wave inversion was noted during stress.  Defect 1: There is a small defect of moderate severity present in the mid anterolateral, apical  lateral and apex location.  Findings consistent with ischemia.  This is an intermediate risk study.   The anterolateral perfusion defect on the prior exam was interpreted as fixed, however on today's exam appears to have reversible ischemia. Wall motion appears abnormal in this region.    Cardiac cath 11/18/18:  LEFT HEART CATH AND CORONARY ANGIOGRAPHY  Conclusion     Prox LAD lesion is 45% stenosed.  Prox LAD to Mid LAD lesion is 65% stenosed.  Ost Cx to Prox Cx lesion is 50% stenosed.  Prox Cx to Mid Cx lesion is 50% stenosed.  Prox RCA to Dist RCA lesion is 100% stenosed.  The left ventricular systolic function is normal.  LV end diastolic pressure is normal.  The left ventricular ejection fraction is 55-65% by visual estimate.   1. Single vessel occlusive CAD with CTO of the RCA. Left to right collaterals. There is diffuse moderate calcific disease involving the LAD and LCx that is not significantly changed since 2011. 2. Normal LV function 3. Normal LV EDP  Plan: continued medical therapy     Assessment / Plan: 1. Coronary disease with remote angioplasty of the LAD and left circumflex coronary is in 1991. Chronic total occlusion of the right coronary. Repeat evaluation with cardiac cath in February 2020 was stable.  He is on maximal antianginal therapy with  aspirin, atenolol, and isosorbide. Continue Ranexa. Focus on risk factor modification. Ntg prn. He did have a  significant anginal episode on Jan 1 but this resolved.   2. Hyperlipidemia. Continue on high-dose atorvastatin. Excellent control. At goal.  3. Diabetes mellitus type 2. Still suboptimal control on metformin, Invokana, glimeperide, and Januvia.  Focus on eliminating sweets and sodas. Management per primary care.   4. Tobacco abuse. Patient has finally quit smoking. Congratulated!  5. Symptoms of sleep apnea. Will arrange for home sleep study.  Follow up in 6 months.

## 2020-10-22 ENCOUNTER — Other Ambulatory Visit: Payer: Self-pay

## 2020-10-22 ENCOUNTER — Ambulatory Visit: Payer: Medicare Other | Admitting: Cardiology

## 2020-10-22 ENCOUNTER — Encounter: Payer: Self-pay | Admitting: Cardiology

## 2020-10-22 VITALS — BP 124/84 | HR 54 | Ht 64.0 in | Wt 146.0 lb

## 2020-10-22 DIAGNOSIS — E7800 Pure hypercholesterolemia, unspecified: Secondary | ICD-10-CM | POA: Diagnosis not present

## 2020-10-22 DIAGNOSIS — G4730 Sleep apnea, unspecified: Secondary | ICD-10-CM | POA: Diagnosis not present

## 2020-10-22 DIAGNOSIS — E119 Type 2 diabetes mellitus without complications: Secondary | ICD-10-CM

## 2020-10-22 DIAGNOSIS — Z720 Tobacco use: Secondary | ICD-10-CM | POA: Diagnosis not present

## 2020-10-22 DIAGNOSIS — I25119 Atherosclerotic heart disease of native coronary artery with unspecified angina pectoris: Secondary | ICD-10-CM | POA: Diagnosis not present

## 2020-10-22 DIAGNOSIS — I209 Angina pectoris, unspecified: Secondary | ICD-10-CM | POA: Diagnosis not present

## 2020-10-22 NOTE — Patient Instructions (Signed)
Medication Instructions:  Continue same medications *If you need a refill on your cardiac medications before your next appointment, please call your pharmacy*   Lab Work: None ordered   Testing/Procedures: Home sleep study   Follow-Up: At [facility], you and your health needs are our priority.  As part of our continuing mission to provide you with exceptional heart care, we have created designated Provider Care Teams.  These Care Teams include your primary Cardiologist (physician) and Advanced Practice Providers (APPs -  Physician Assistants and Nurse Practitioners) who all work together to provide you with the care you need, when you need it.  We recommend signing up for the patient portal called "MyChart".  Sign up information is provided on this After Visit Summary.  MyChart is used to connect with patients for Virtual Visits (Telemedicine).  Patients are able to view lab/test results, encounter notes, upcoming appointments, etc.  Non-urgent messages can be sent to your provider as well.   To learn more about what you can do with MyChart, go to [email or web address].    Your next appointment:  6 months    Call in March to schedule July appointment    The format for your next appointment:  Office   Provider: Dr.Jordan

## 2020-10-25 ENCOUNTER — Telehealth: Payer: Self-pay | Admitting: *Deleted

## 2020-10-25 NOTE — Telephone Encounter (Signed)
Patient informed of HST appointment details and procedure. He voiced understanding.

## 2020-11-11 ENCOUNTER — Telehealth: Payer: Self-pay | Admitting: *Deleted

## 2020-11-11 ENCOUNTER — Telehealth: Payer: Self-pay | Admitting: Cardiology

## 2020-11-11 DIAGNOSIS — G4730 Sleep apnea, unspecified: Secondary | ICD-10-CM

## 2020-11-11 DIAGNOSIS — R0683 Snoring: Secondary | ICD-10-CM

## 2020-11-11 NOTE — Telephone Encounter (Signed)
Per wife, she and the patient have decided that he should have an in lab sleep study instead of HST, can Dr. Martinique place this order?

## 2020-11-11 NOTE — Telephone Encounter (Signed)
Spoke to patient's wife she stated husband has decided to have sleep study done at [facility] instead of a home sleep study.Advised I will place order.Sleep coordinator will call back with appointment.

## 2020-11-11 NOTE — Telephone Encounter (Signed)
Patient notified sleep study has been changed from HST to a in lab split night sleep study per his request and Dr Doug Sou approval. [facility] web portal no PA is required. Decision VX:[id number].

## 2020-11-21 ENCOUNTER — Other Ambulatory Visit: Payer: Self-pay | Admitting: Cardiology

## 2020-11-25 ENCOUNTER — Encounter (HOSPITAL_BASED_OUTPATIENT_CLINIC_OR_DEPARTMENT_OTHER): Payer: Medicare Other | Admitting: Cardiovascular Disease

## 2020-12-12 ENCOUNTER — Telehealth: Payer: Self-pay | Admitting: Cardiology

## 2020-12-12 NOTE — Telephone Encounter (Signed)
Returned call to patient-patient reports episodes of chest pain x 3-4 days.   He states when he exerts himself he starts having a burning sensation in his chest, left arm pain, and jaw pain.    Reports when he sits down to rest, pain resolves.    He has not taken NTG.   Denies SOB, N/V/D.   Reports this is similar to his pain in the past.    No pain at current.  Hx: CAD, HLD, DM2.  Last OV with Dr. Martinique 1/25.  Appt scheduled with Dr. Martinique tomorrow to assess further.  Aware if chest pain returns and does not resolve with rest/ntg to proceed to ER for evaluation.

## 2020-12-12 NOTE — Telephone Encounter (Signed)
Pt c/o of Chest Pain: STAT if CP now or developed within 24 hours  1. Are you having CP right now? No   2. Are you experiencing any other symptoms (ex. SOB, nausea, vomiting, sweating)? Left arm pain   3. How long have you been experiencing CP? For the 3-4 days  4. Is your CP continuous or coming and going? Coming and going   5. Have you taken Nitroglycerin? No  ?

## 2020-12-13 ENCOUNTER — Other Ambulatory Visit: Payer: Self-pay

## 2020-12-13 ENCOUNTER — Other Ambulatory Visit (HOSPITAL_COMMUNITY)
Admission: RE | Admit: 2020-12-13 | Discharge: 2020-12-13 | Disposition: A | Discharge status: Home / Self Care | Payer: Medicare Other | Source: Ambulatory Visit | Attending: Cardiology | Admitting: Cardiology

## 2020-12-13 ENCOUNTER — Ambulatory Visit: Payer: Medicare Other | Admitting: Cardiology

## 2020-12-13 ENCOUNTER — Other Ambulatory Visit (HOSPITAL_COMMUNITY): Payer: Medicare Other

## 2020-12-13 ENCOUNTER — Other Ambulatory Visit: Payer: Self-pay | Admitting: Cardiology

## 2020-12-13 ENCOUNTER — Encounter: Payer: Self-pay | Admitting: Cardiology

## 2020-12-13 VITALS — BP 120/70 | HR 61 | Ht 65.0 in | Wt 143.2 lb

## 2020-12-13 DIAGNOSIS — I200 Unstable angina: Secondary | ICD-10-CM

## 2020-12-13 DIAGNOSIS — Z20822 Contact with and (suspected) exposure to covid-19: Secondary | ICD-10-CM | POA: Diagnosis not present

## 2020-12-13 DIAGNOSIS — E7800 Pure hypercholesterolemia, unspecified: Secondary | ICD-10-CM

## 2020-12-13 DIAGNOSIS — G4730 Sleep apnea, unspecified: Secondary | ICD-10-CM | POA: Diagnosis not present

## 2020-12-13 DIAGNOSIS — Z01812 Encounter for preprocedural laboratory examination: Secondary | ICD-10-CM | POA: Insufficient documentation

## 2020-12-13 DIAGNOSIS — Z720 Tobacco use: Secondary | ICD-10-CM | POA: Diagnosis not present

## 2020-12-13 DIAGNOSIS — I25119 Atherosclerotic heart disease of native coronary artery with unspecified angina pectoris: Secondary | ICD-10-CM | POA: Diagnosis not present

## 2020-12-13 LAB — SARS CORONAVIRUS 2 (TAT 6-24 HRS): SARS Coronavirus 2: NEGATIVE

## 2020-12-13 MED ORDER — SODIUM CHLORIDE 0.9% FLUSH: 3.0000 mL | Freq: Two times a day (BID) | INTRAVENOUS | Status: DC

## 2020-12-13 NOTE — H&P (View-Only) (Signed)
Benjamin [name] Date of Birth: 10-21-1945 Medical Record #[id number]  History of Present Illness: Benjamin [name] is seen as a work in for evaluation of chest pain. He has a history of coronary disease with prior angioplasty of the proximal LAD in 1991. He also had angioplasty of the circumflex at that time. He has known occlusion of the right coronary with collateral flow. Last Myoview study in October 2015 showed a small anterolateral scar. No ischemia.    On a prior  visit he described 3 episodes of chest pain for which he took sl Ntg. Lasted 5-10 minutes. No clear triggers. One time he became diaphoretic.  He denies dyspnea or palpitation.  He had a Myoview study showing anterior ischemia. This led to a cardiac cath in February 2020 showing occluded RCA and moderate nonobstructive disease in the LAD and LCx. Unchanged from prior study. Medical therapy recommended.   He is seen today with his son. He states that for the past 2 weeks he has chest burning, left jaw and left arm pain with exertion such as one flight of stairs or walking to the mailbox. He notes it is scary. Last 5-8 minutes and resolves. Hasn't taken sl Ntg. Notes HR goes up to 85-90.   Allergies as of 12/13/2020      Reactions   Sulfa Drugs Cross Reactors Rash      Medication List       Accurate as of December 13, 2020  2:26 PM. If you have any questions, ask your [name] or doctor.        STOP taking these medications   Invokana 300 MG Tabs tablet Generic drug: canagliflozin Stopped by: Khadeja Abt Martinique, MD     TAKE these medications   aspirin EC 81 MG tablet Take 1 tablet (81 mg total) by mouth daily.   atenolol 25 MG tablet Commonly known as: TENORMIN TAKE 1 TABLET(25 MG) BY MOUTH DAILY   atorvastatin 80 MG tablet Commonly known as: LIPITOR TAKE 1 TABLET BY MOUTH DAILY   calcium carbonate 500 MG chewable tablet Commonly known as: TUMS - dosed in mg elemental calcium Chew 2 tablets by mouth at bedtime.   Farxiga 5  MG Tabs tablet Generic drug: dapagliflozin propanediol Take 5 mg by mouth daily.   glimepiride 4 MG tablet Commonly known as: AMARYL Take 4 mg by mouth daily with lunch.   isosorbide mononitrate 60 MG 24 hr tablet Commonly known as: IMDUR TAKE 1 TABLET BY MOUTH DAILY   Januvia 100 MG tablet Generic drug: sitaGLIPtin Take 100 mg by mouth daily with lunch.   lisinopril 20 MG tablet Commonly known as: ZESTRIL TAKE 1/2 TABLET(10 MG) BY MOUTH DAILY   metFORMIN 1000 MG tablet Commonly known as: GLUCOPHAGE Take 1 tablet (1,000 mg total) by mouth 2 (two) times daily.   nitroGLYCERIN 0.4 MG SL tablet Commonly known as: NITROSTAT Place 1 tablet (0.4 mg total) under the tongue every 5 (five) minutes as needed.   ranolazine 500 MG 12 hr tablet Commonly known as: Ranexa Take 1 tablet (500 mg total) by mouth daily.        Allergies  Allergen Reactions  . Sulfa Drugs Cross Reactors Rash    Past Medical History:  Diagnosis Date  . Coronary artery disease    Prior PCI to the prox LAD in 1991; S/P PCI to LCX in October 1991  . Diabetes mellitus    TYPE 2  . History of kidney stones    many yrs  ago  . Hyperlipidemia   . Tobacco abuse     Past Surgical History:  Procedure Laterality Date  . COLONOSCOPY WITH PROPOFOL N/A 04/21/2016   Procedure: COLONOSCOPY WITH PROPOFOL;  Surgeon: Garlan Fair, MD;  Location: [facility];  Service: Endoscopy;  Laterality: N/A;  . CORONARY ANGIOPLASTY  05/1990   PROXIMAL LAD  . CORONARY ANGIOPLASTY  06/1990   LEFT CIRCUMFLEX CORONARY  . LEFT HEART CATH AND CORONARY ANGIOGRAPHY N/A 11/18/2018   Procedure: LEFT HEART CATH AND CORONARY ANGIOGRAPHY;  Surgeon: Martinique, Mariellen Blaney M, MD;  Location: [place] [facility];  Service: Cardiovascular;  Laterality: N/A;  . UMBILICAL HERNIA REPAIR      Social History   Tobacco Use  Smoking Status Current Every Day Smoker  . Packs/day: 1.00  . Years: 54.00  . Pack years: 54.00  . Types: Cigarettes   Smokeless Tobacco Never Used    Social History   Substance and Sexual Activity  Alcohol Use Yes   Comment: every other week    Family History  Problem Relation Age of Onset  . Heart disease Sister        had open heart surgery    Review of Systems: As noted in history of present illness.  All other systems were reviewed and are negative.  Physical Exam: BP 120/70   Pulse 61   Ht 5\' 5"  (1.651 m)   Wt 143 lb 3.2 oz (65 kg)   SpO2 93%   BMI 23.83 kg/m  GENERAL:  Well appearing WM in NAD HEENT:  PERRL, EOMI, sclera are clear. Oropharynx is clear. NECK:  No jugular venous distention, carotid upstroke brisk and symmetric, no bruits, no thyromegaly or adenopathy LUNGS:  Clear to auscultation bilaterally CHEST:  Unremarkable HEART:  RRR,  PMI not displaced or sustained,S1 and S2 within normal limits, no S3, no S4: no clicks, no rubs, no murmurs ABD:  Soft, nontender. BS +, no masses or bruits. No hepatomegaly, no splenomegaly EXT:  2 + pulses throughout, no edema, no cyanosis no clubbing. ll. SKIN:  Warm and dry.  No rashes NEURO:  Alert and oriented x 3. Cranial nerves II through XII intact. PSYCH:  Cognitively intact   LABORATORY DATA:  Lab Results  Component Value Date   WBC 8.8 11/14/2018   HGB 14.6 11/14/2018   HCT 43.9 11/14/2018   PLT 167 11/14/2018   GLUCOSE 262 (H) 11/14/2018   NA 136 11/14/2018   K 4.9 11/14/2018   CL 100 11/14/2018   CREATININE 0.98 11/14/2018   BUN 16 11/14/2018   CO2 19 (L) 11/14/2018   Labs dated 08/05/16: cholesterol 115, triglycerides 56, LDL 49, HDL 56. A1c 7.4%. CMET normal. Dated 08/30/17: cholesterol 120, triglycerides 58, HDL 46, LDL 62. A1c 7.8%. Chemistries and CBC normal.  Dated 09/16/18: cholesterol 106, triglycerides 51, HDL 48, LDL48. A1c 7.4%. CMET normal Dated 03/27/19: A1c 7.5%. BMET normal Dated 10/19/19: cholesterol 119, triglycerides 52, HDL 51, LDL 56, CMET normal Dated 04/10/20: A1c 6.8%.  Dated 10/10/20: cholesterol  123, triglycerides 56, HDL 55, LDL 56. A1c 7.1%. CMET normal.   Ecg today shows NSR rate 61 with nonspecific ST depression more pronounced in the anterior leads. I have personally reviewed and interpreted this study.  Myoview 11/09/18: Study Highlights     The left ventricular ejection fraction is normal (55-65%).  Nuclear stress EF: 59%.  Blood pressure demonstrated a normal response to exercise.  There was no ST segment deviation noted during stress.  No T  wave inversion was noted during stress.  Defect 1: There is a small defect of moderate severity present in the mid anterolateral, apical lateral and apex location.  Findings consistent with ischemia.  This is an intermediate risk study.   The anterolateral perfusion defect on the prior exam was interpreted as fixed, however on today's exam appears to have reversible ischemia. Wall motion appears abnormal in this region.    Cardiac cath 11/18/18:  LEFT HEART CATH AND CORONARY ANGIOGRAPHY  Conclusion     Prox LAD lesion is 45% stenosed.  Prox LAD to Mid LAD lesion is 65% stenosed.  Ost Cx to Prox Cx lesion is 50% stenosed.  Prox Cx to Mid Cx lesion is 50% stenosed.  Prox RCA to Dist RCA lesion is 100% stenosed.  The left ventricular systolic function is normal.  LV end diastolic pressure is normal.  The left ventricular ejection fraction is 55-65% by visual estimate.   1. Single vessel occlusive CAD with CTO of the RCA. Left to right collaterals. There is diffuse moderate calcific disease involving the LAD and LCx that is not significantly changed since 2011. 2. Normal LV function 3. Normal LV EDP  Plan: continued medical therapy     Assessment / Plan: 1. Coronary disease with remote angioplasty of the LAD and left circumflex coronary  in 1991. Chronic total occlusion of the right coronary. Repeat evaluation with cardiac cath in February 2020 was stable.  He is on maximal antianginal therapy with   aspirin, atenolol, and isosorbide, Ranexa. He now presents with 2 week history of worsening exertional chest pain - typical c/w unstable angina. Recommend proceeding directly to cardiac cath. The procedure and risks were reviewed including but not limited to death, myocardial infarction, stroke, arrythmias, bleeding, transfusion, emergency surgery, dye allergy, or renal dysfunction. The patient voices understanding and is agreeable to proceed.   2. Hyperlipidemia. Continue on high-dose atorvastatin. Excellent control. At goal.  3. Diabetes mellitus type 2.  on metformin, Invokana, glimeperide, and Januvia. Reports last A1c 7%.  Focus on eliminating sweets and sodas. Management per primary care.   4. Tobacco abuse. Only smokes when he goes to his [place] about once a month.  5. Symptoms of sleep apnea. Fatigue. Split night sleep study planned for 12/26/20.

## 2020-12-13 NOTE — Patient Instructions (Signed)
.  Medication Instructions:  Continue same medications *If you need a refill on your cardiac medications before your next appointment, please call your pharmacy*   Lab Work: Bmet,cbc,pt,lipid panel today at [place] office  Covid Test today at 3:10 pm  [address] drive thru site [place] until after Cath   Testing/Procedures: Cardiac cath   Follow instructions below   Follow-Up: At [facility], you and your health needs are our priority.  As part of our continuing mission to provide you with exceptional heart care, we have created designated Provider Care Teams.  These Care Teams include your primary Cardiologist (physician) and Advanced Practice Providers (APPs -  Physician Assistants and Nurse Practitioners) who all work together to provide you with the care you need, when you need it.  We recommend signing up for the patient portal called "MyChart".  Sign up information is provided on this After Visit Summary.  MyChart is used to connect with patients for Virtual Visits (Telemedicine).  Patients are able to view lab/test results, encounter notes, upcoming appointments, etc.  Non-urgent messages can be sent to your provider as well.   To learn more about what you can do with MyChart, go to [email or web address].    Your next appointment:     The format for your next appointment: Office    Provider:  [facility] [place] [address] Dept: [phone] Loc: [phone]  Benjamin [name]  12/13/2020  You are scheduled for a Cardiac cath on Tuesday 12/17/20,  with Dr.Jordan.  1. Please arrive at the [facility] (Main Entrance A) at [facility]: [address] [place], [place] [address] at 11:30 am (This time is two hours before your procedure to ensure your preparation). Free valet parking service is available.   Special note: Every  effort is made to have your procedure done on time. Please understand that emergencies sometimes delay scheduled procedures.  2. Diet: Do not eat solid foods after midnight.  The patient may have clear liquids until 5am upon the day of the procedure.  3. Labs: You will need to have blood drawn today at [place] office You do not need to be fasting.  Covid test today at 3:10 pm  [address] thru site [place] until after cath.  4. Medication instructions in preparation for your procedur   Hold Glimepiride morning of cath  Hold Metformin morning of cath and Hold 2 days after cath    On the morning of your procedure, take your Aspirin and any morning medicines NOT listed above.  You may use sips of water.  5. Plan for one night stay--bring personal belongings. 6. Bring a current list of your medications and current insurance cards. 7. You MUST have a responsible person to drive you home. 8. Someone MUST be with you the first 24 hours after you arrive home or your discharge will be delayed. 9. Please wear clothes that are easy to get on and off and wear slip-on shoes.  Thank you for allowing [place] to care for you!   -- [place] [facility]

## 2020-12-13 NOTE — Progress Notes (Signed)
Amaro De Nurse Date of Birth: 02-11-1946 Medical Record #[id number]  History of Present Illness: Benjamin [name] is seen as a work in for evaluation of chest pain. He has a history of coronary disease with prior angioplasty of the proximal LAD in 1991. He also had angioplasty of the circumflex at that time. He has known occlusion of the right coronary with collateral flow. Last Myoview study in October 2015 showed a small anterolateral scar. No ischemia.    On a prior  visit he described 3 episodes of chest pain for which he took sl Ntg. Lasted 5-10 minutes. No clear triggers. One time he became diaphoretic.  He denies dyspnea or palpitation.  He had a Myoview study showing anterior ischemia. This led to a cardiac cath in February 2020 showing occluded RCA and moderate nonobstructive disease in the LAD and LCx. Unchanged from prior study. Medical therapy recommended.   He is seen today with his son. He states that for the past 2 weeks he has chest burning, left jaw and left arm pain with exertion such as one flight of stairs or walking to the mailbox. He notes it is scary. Last 5-8 minutes and resolves. Hasn't taken sl Ntg. Notes HR goes up to 85-90.   Allergies as of 12/13/2020      Reactions   Sulfa Drugs Cross Reactors Rash      Medication List       Accurate as of December 13, 2020  2:26 PM. If you have any questions, ask your nurse or doctor.        STOP taking these medications   Invokana 300 MG Tabs tablet Generic drug: canagliflozin Stopped by: Demeisha Geraghty Martinique, MD     TAKE these medications   aspirin EC 81 MG tablet Take 1 tablet (81 mg total) by mouth daily.   atenolol 25 MG tablet Commonly known as: TENORMIN TAKE 1 TABLET(25 MG) BY MOUTH DAILY   atorvastatin 80 MG tablet Commonly known as: LIPITOR TAKE 1 TABLET BY MOUTH DAILY   calcium carbonate 500 MG chewable tablet Commonly known as: TUMS - dosed in mg elemental calcium Chew 2 tablets by mouth at bedtime.   Farxiga 5  MG Tabs tablet Generic drug: dapagliflozin propanediol Take 5 mg by mouth daily.   glimepiride 4 MG tablet Commonly known as: AMARYL Take 4 mg by mouth daily with lunch.   isosorbide mononitrate 60 MG 24 hr tablet Commonly known as: IMDUR TAKE 1 TABLET BY MOUTH DAILY   Januvia 100 MG tablet Generic drug: sitaGLIPtin Take 100 mg by mouth daily with lunch.   lisinopril 20 MG tablet Commonly known as: ZESTRIL TAKE 1/2 TABLET(10 MG) BY MOUTH DAILY   metFORMIN 1000 MG tablet Commonly known as: GLUCOPHAGE Take 1 tablet (1,000 mg total) by mouth 2 (two) times daily.   nitroGLYCERIN 0.4 MG SL tablet Commonly known as: NITROSTAT Place 1 tablet (0.4 mg total) under the tongue every 5 (five) minutes as needed.   ranolazine 500 MG 12 hr tablet Commonly known as: Ranexa Take 1 tablet (500 mg total) by mouth daily.        Allergies  Allergen Reactions  . Sulfa Drugs Cross Reactors Rash    Past Medical History:  Diagnosis Date  . Coronary artery disease    Prior PCI to the prox LAD in 1991; S/P PCI to LCX in October 1991  . Diabetes mellitus    TYPE 2  . History of kidney stones    many yrs  ago  . Hyperlipidemia   . Tobacco abuse     Past Surgical History:  Procedure Laterality Date  . COLONOSCOPY WITH PROPOFOL N/A 04/21/2016   Procedure: COLONOSCOPY WITH PROPOFOL;  Surgeon: Garlan Fair, MD;  Location: [facility];  Service: Endoscopy;  Laterality: N/A;  . CORONARY ANGIOPLASTY  05/1990   PROXIMAL LAD  . CORONARY ANGIOPLASTY  06/1990   LEFT CIRCUMFLEX CORONARY  . LEFT HEART CATH AND CORONARY ANGIOGRAPHY N/A 11/18/2018   Procedure: LEFT HEART CATH AND CORONARY ANGIOGRAPHY;  Surgeon: Martinique, Frenchie Pribyl M, MD;  Location: [facility];  Service: Cardiovascular;  Laterality: N/A;  . UMBILICAL HERNIA REPAIR      Social History   Tobacco Use  Smoking Status Current Every Day Smoker  . Packs/day: 1.00  . Years: 54.00  . Pack years: 54.00  . Types: Cigarettes   Smokeless Tobacco Never Used    Social History   Substance and Sexual Activity  Alcohol Use Yes   Comment: every other week    Family History  Problem Relation Age of Onset  . Heart disease Sister        had open heart surgery    Review of Systems: As noted in history of present illness.  All other systems were reviewed and are negative.  Physical Exam: BP 120/70   Pulse 61   Ht 5\' 5"  (1.651 m)   Wt 143 lb 3.2 oz (65 kg)   SpO2 93%   BMI 23.83 kg/m  GENERAL:  Well appearing WM in NAD HEENT:  PERRL, EOMI, sclera are clear. Oropharynx is clear. NECK:  No jugular venous distention, carotid upstroke brisk and symmetric, no bruits, no thyromegaly or adenopathy LUNGS:  Clear to auscultation bilaterally CHEST:  Unremarkable HEART:  RRR,  PMI not displaced or sustained,S1 and S2 within normal limits, no S3, no S4: no clicks, no rubs, no murmurs ABD:  Soft, nontender. BS +, no masses or bruits. No hepatomegaly, no splenomegaly EXT:  2 + pulses throughout, no edema, no cyanosis no clubbing. ll. SKIN:  Warm and dry.  No rashes NEURO:  Alert and oriented x 3. Cranial nerves II through XII intact. PSYCH:  Cognitively intact   LABORATORY DATA:  Lab Results  Component Value Date   WBC 8.8 11/14/2018   HGB 14.6 11/14/2018   HCT 43.9 11/14/2018   PLT 167 11/14/2018   GLUCOSE 262 (H) 11/14/2018   NA 136 11/14/2018   K 4.9 11/14/2018   CL 100 11/14/2018   CREATININE 0.98 11/14/2018   BUN 16 11/14/2018   CO2 19 (L) 11/14/2018   Labs dated 08/05/16: cholesterol 115, triglycerides 56, LDL 49, HDL 56. A1c 7.4%. CMET normal. Dated 08/30/17: cholesterol 120, triglycerides 58, HDL 46, LDL 62. A1c 7.8%. Chemistries and CBC normal.  Dated 09/16/18: cholesterol 106, triglycerides 51, HDL 48, LDL48. A1c 7.4%. CMET normal Dated 03/27/19: A1c 7.5%. BMET normal Dated 10/19/19: cholesterol 119, triglycerides 52, HDL 51, LDL 56, CMET normal Dated 04/10/20: A1c 6.8%.  Dated 10/10/20: cholesterol  123, triglycerides 56, HDL 55, LDL 56. A1c 7.1%. CMET normal.   Ecg today shows NSR rate 61 with nonspecific ST depression more pronounced in the anterior leads. I have personally reviewed and interpreted this study.  Myoview 11/09/18: Study Highlights     The left ventricular ejection fraction is normal (55-65%).  Nuclear stress EF: 59%.  Blood pressure demonstrated a normal response to exercise.  There was no ST segment deviation noted during stress.  No T  wave inversion was noted during stress.  Defect 1: There is a small defect of moderate severity present in the mid anterolateral, apical lateral and apex location.  Findings consistent with ischemia.  This is an intermediate risk study.   The anterolateral perfusion defect on the prior exam was interpreted as fixed, however on today's exam appears to have reversible ischemia. Wall motion appears abnormal in this region.    Cardiac cath 11/18/18:  LEFT HEART CATH AND CORONARY ANGIOGRAPHY  Conclusion     Prox LAD lesion is 45% stenosed.  Prox LAD to Mid LAD lesion is 65% stenosed.  Ost Cx to Prox Cx lesion is 50% stenosed.  Prox Cx to Mid Cx lesion is 50% stenosed.  Prox RCA to Dist RCA lesion is 100% stenosed.  The left ventricular systolic function is normal.  LV end diastolic pressure is normal.  The left ventricular ejection fraction is 55-65% by visual estimate.   1. Single vessel occlusive CAD with CTO of the RCA. Left to right collaterals. There is diffuse moderate calcific disease involving the LAD and LCx that is not significantly changed since 2011. 2. Normal LV function 3. Normal LV EDP  Plan: continued medical therapy     Assessment / Plan: 1. Coronary disease with remote angioplasty of the LAD and left circumflex coronary  in 1991. Chronic total occlusion of the right coronary. Repeat evaluation with cardiac cath in February 2020 was stable.  He is on maximal antianginal therapy with   aspirin, atenolol, and isosorbide, Ranexa. He now presents with 2 week history of worsening exertional chest pain - typical c/w unstable angina. Recommend proceeding directly to cardiac cath. The procedure and risks were reviewed including but not limited to death, myocardial infarction, stroke, arrythmias, bleeding, transfusion, emergency surgery, dye allergy, or renal dysfunction. The patient voices understanding and is agreeable to proceed.   2. Hyperlipidemia. Continue on high-dose atorvastatin. Excellent control. At goal.  3. Diabetes mellitus type 2.  on metformin, Invokana, glimeperide, and Januvia. Reports last A1c 7%.  Focus on eliminating sweets and sodas. Management per primary care.   4. Tobacco abuse. Only smokes when he goes to his [place] about once a month.  5. Symptoms of sleep apnea. Fatigue. Split night sleep study planned for 12/26/20.

## 2020-12-14 LAB — BASIC METABOLIC PANEL
BUN/Creatinine Ratio: 16 (ref 10–24)
BUN: 19 mg/dL (ref 8–27)
CO2: 17 mmol/L — ABNORMAL LOW (ref 20–29)
Calcium: 9.6 mg/dL (ref 8.6–10.2)
Chloride: 102 mmol/L (ref 96–106)
Creatinine, Ser: 1.19 mg/dL (ref 0.76–1.27)
Glucose: 211 mg/dL — ABNORMAL HIGH (ref 65–99)
Potassium: 4.6 mmol/L (ref 3.5–5.2)
Sodium: 138 mmol/L (ref 134–144)
eGFR: 63 mL/min/{1.73_m2} (ref >59)

## 2020-12-14 LAB — CBC WITH DIFFERENTIAL/PLATELET
Basophils Absolute: 0 10*3/uL (ref 0.0–0.2)
Basos: 0 %
EOS (ABSOLUTE): 0 10*3/uL (ref 0.0–0.4)
Eos: 0 %
Hematocrit: 40.6 % (ref 37.5–51.0)
Hemoglobin: 13.2 g/dL (ref 13.0–17.7)
Immature Grans (Abs): 0.1 10*3/uL (ref 0.0–0.1)
Immature Granulocytes: 1 %
Lymphocytes Absolute: 1.7 10*3/uL (ref 0.7–3.1)
Lymphs: 15 %
MCH: 26.6 pg (ref 26.6–33.0)
MCHC: 32.5 g/dL (ref 31.5–35.7)
MCV: 82 fL (ref 79–97)
Monocytes Absolute: 1.4 10*3/uL — ABNORMAL HIGH (ref 0.1–0.9)
Monocytes: 12 %
Neutrophils Absolute: 8.2 10*3/uL — ABNORMAL HIGH (ref 1.4–7.0)
Neutrophils: 72 %
Platelets: 158 10*3/uL (ref 150–450)
RBC: 4.96 x10E6/uL (ref 4.14–5.80)
RDW: 15.4 % (ref 11.6–15.4)
WBC: 11.4 10*3/uL — ABNORMAL HIGH (ref 3.4–10.8)

## 2020-12-14 LAB — LIPID PANEL
Chol/HDL Ratio: 2 ratio (ref 0.0–5.0)
Cholesterol, Total: 129 mg/dL (ref 100–199)
HDL: 64 mg/dL (ref >39)
LDL Chol Calc (NIH): 49 mg/dL (ref 0–99)
Triglycerides: 80 mg/dL (ref 0–149)
VLDL Cholesterol Cal: 16 mg/dL (ref 5–40)

## 2020-12-14 LAB — PT AND PTT
INR: 1 (ref 0.9–1.2)
Prothrombin Time: 10.3 s (ref 9.1–12.0)
aPTT: 24 s (ref 24–33)

## 2020-12-16 ENCOUNTER — Telehealth: Payer: Self-pay | Admitting: *Deleted

## 2020-12-16 NOTE — Telephone Encounter (Signed)
Pt contacted pre-catheterization scheduled at [facility] for: Tuesday December 17, 2020 1:30 PM Verified arrival time and place: [place] [facility]) at: 11:30 AM   No solid food after midnight prior to cath, clear liquids until 5 AM day of procedure.  Hold: Januvia-AM of procedure Farxiga-AM of procedure Glimepiride-AM of procedure Metformin-day of procedure and 48 hours post procedure  Except hold medications AM meds can be  taken pre-cath with sips of water including: ASA 81 mg   Confirmed patient has responsible adult to drive home post procedure and be with patient first 24 hours after arriving home: yes  You are allowed ONE visitor in the waiting room during the time you are at the hospital for your procedure. Both you and your visitor must wear a mask once you enter the hospital. Patient's wife is also aware of visitor policy for procedure.   Reviewed procedure/mask/visitor instructions with patient.

## 2020-12-17 ENCOUNTER — Other Ambulatory Visit: Payer: Self-pay

## 2020-12-17 ENCOUNTER — Ambulatory Visit (HOSPITAL_COMMUNITY)
Admission: RE | Admit: 2020-12-17 | Discharge: 2020-12-17 | Disposition: A | Discharge status: Home / Self Care | Payer: Medicare Other | Attending: Cardiology | Admitting: Cardiology

## 2020-12-17 ENCOUNTER — Encounter (HOSPITAL_COMMUNITY)
Admission: RE | Disposition: A | Discharge status: Home / Self Care | Payer: Self-pay | Source: Home / Self Care | Attending: Cardiology

## 2020-12-17 DIAGNOSIS — F17210 Nicotine dependence, cigarettes, uncomplicated: Secondary | ICD-10-CM | POA: Diagnosis not present

## 2020-12-17 DIAGNOSIS — I25119 Atherosclerotic heart disease of native coronary artery with unspecified angina pectoris: Secondary | ICD-10-CM

## 2020-12-17 DIAGNOSIS — R5383 Other fatigue: Secondary | ICD-10-CM | POA: Diagnosis not present

## 2020-12-17 DIAGNOSIS — I2582 Chronic total occlusion of coronary artery: Secondary | ICD-10-CM | POA: Diagnosis not present

## 2020-12-17 DIAGNOSIS — Z79899 Other long term (current) drug therapy: Secondary | ICD-10-CM | POA: Insufficient documentation

## 2020-12-17 DIAGNOSIS — E119 Type 2 diabetes mellitus without complications: Secondary | ICD-10-CM

## 2020-12-17 DIAGNOSIS — Z7984 Long term (current) use of oral hypoglycemic drugs: Secondary | ICD-10-CM | POA: Insufficient documentation

## 2020-12-17 DIAGNOSIS — I25110 Atherosclerotic heart disease of native coronary artery with unstable angina pectoris: Secondary | ICD-10-CM | POA: Insufficient documentation

## 2020-12-17 DIAGNOSIS — E785 Hyperlipidemia, unspecified: Secondary | ICD-10-CM | POA: Diagnosis not present

## 2020-12-17 DIAGNOSIS — I200 Unstable angina: Secondary | ICD-10-CM

## 2020-12-17 DIAGNOSIS — Z882 Allergy status to sulfonamides status: Secondary | ICD-10-CM | POA: Diagnosis not present

## 2020-12-17 DIAGNOSIS — Z7982 Long term (current) use of aspirin: Secondary | ICD-10-CM | POA: Diagnosis not present

## 2020-12-17 DIAGNOSIS — I2510 Atherosclerotic heart disease of native coronary artery without angina pectoris: Secondary | ICD-10-CM | POA: Diagnosis present

## 2020-12-17 DIAGNOSIS — Z720 Tobacco use: Secondary | ICD-10-CM | POA: Diagnosis present

## 2020-12-17 DIAGNOSIS — I209 Angina pectoris, unspecified: Secondary | ICD-10-CM | POA: Diagnosis present

## 2020-12-17 HISTORY — PX: LEFT HEART CATH AND CORONARY ANGIOGRAPHY: CATH118249

## 2020-12-17 LAB — GLUCOSE, CAPILLARY: Glucose-Capillary: 185 mg/dL — ABNORMAL HIGH (ref 70–99)

## 2020-12-17 SURGERY — LEFT HEART CATH AND CORONARY ANGIOGRAPHY
Anesthesia: LOCAL

## 2020-12-17 MED ORDER — LIDOCAINE HCL (PF) 1 % IJ SOLN
INTRAMUSCULAR | Status: AC
  Filled 2020-12-17: qty 30

## 2020-12-17 MED ORDER — MIDAZOLAM HCL 2 MG/2ML IJ SOLN
INTRAMUSCULAR | Status: AC
  Filled 2020-12-17: qty 2

## 2020-12-17 MED ORDER — VERAPAMIL HCL 2.5 MG/ML IV SOLN
INTRAVENOUS | Status: DC | PRN
  Administered 2020-12-17: 10 mL via INTRA_ARTERIAL

## 2020-12-17 MED ORDER — SODIUM CHLORIDE 0.9% FLUSH: 3.0000 mL | INTRAVENOUS | Status: DC | PRN

## 2020-12-17 MED ORDER — SODIUM CHLORIDE 0.9 % IV SOLN: 250.0000 mL | INTRAVENOUS | Status: DC | PRN

## 2020-12-17 MED ORDER — VERAPAMIL HCL 2.5 MG/ML IV SOLN
INTRAVENOUS | Status: AC
  Filled 2020-12-17: qty 2

## 2020-12-17 MED ORDER — ASPIRIN 81 MG PO CHEW: 81.0000 mg | CHEWABLE_TABLET | ORAL | Status: DC

## 2020-12-17 MED ORDER — HEPARIN (PORCINE) IN NACL 1000-0.9 UT/500ML-% IV SOLN
INTRAVENOUS | Status: AC
  Filled 2020-12-17: qty 1000

## 2020-12-17 MED ORDER — SODIUM CHLORIDE 0.9 % WEIGHT BASED INFUSION: 1.0000 mL/kg/h | INTRAVENOUS | Status: DC

## 2020-12-17 MED ORDER — SODIUM CHLORIDE 0.9 % WEIGHT BASED INFUSION
3.0000 mL/kg/h | INTRAVENOUS | Status: AC
  Administered 2020-12-17: 3 mL/kg/h via INTRAVENOUS

## 2020-12-17 MED ORDER — HEPARIN SODIUM (PORCINE) 1000 UNIT/ML IJ SOLN
INTRAMUSCULAR | Status: DC | PRN
  Administered 2020-12-17: 3500 [IU] via INTRAVENOUS

## 2020-12-17 MED ORDER — IOHEXOL 350 MG/ML SOLN
INTRAVENOUS | Status: DC | PRN
  Administered 2020-12-17: 60 mL

## 2020-12-17 MED ORDER — HEPARIN (PORCINE) IN NACL 1000-0.9 UT/500ML-% IV SOLN
INTRAVENOUS | Status: DC | PRN
  Administered 2020-12-17 (×2): 500 mL

## 2020-12-17 MED ORDER — HEPARIN SODIUM (PORCINE) 1000 UNIT/ML IJ SOLN
INTRAMUSCULAR | Status: AC
  Filled 2020-12-17: qty 1

## 2020-12-17 MED ORDER — LIDOCAINE HCL (PF) 1 % IJ SOLN
INTRAMUSCULAR | Status: DC | PRN
  Administered 2020-12-17: 2 mL

## 2020-12-17 MED ORDER — FENTANYL CITRATE (PF) 100 MCG/2ML IJ SOLN
INTRAMUSCULAR | Status: AC
  Filled 2020-12-17: qty 2

## 2020-12-17 MED ORDER — METFORMIN HCL 1000 MG PO TABS: 1000.0000 mg | ORAL_TABLET | Freq: Two times a day (BID) | ORAL | Status: DC

## 2020-12-17 MED ORDER — MIDAZOLAM HCL 2 MG/2ML IJ SOLN
INTRAMUSCULAR | Status: DC | PRN
  Administered 2020-12-17: 1 mg via INTRAVENOUS

## 2020-12-17 MED ORDER — FENTANYL CITRATE (PF) 100 MCG/2ML IJ SOLN
INTRAMUSCULAR | Status: DC | PRN
  Administered 2020-12-17: 25 ug via INTRAVENOUS

## 2020-12-17 SURGICAL SUPPLY — 10 items
CATH 5FR JL3.5 JR4 ANG PIG MP (CATHETERS) ×2 IMPLANT
CATH INFINITI 5FR JK (CATHETERS) ×2 IMPLANT
DEVICE RAD COMP TR BAND LRG (VASCULAR PRODUCTS) ×2 IMPLANT
GLIDESHEATH SLEND SS 6F .021 (SHEATH) ×2 IMPLANT
GUIDEWIRE INQWIRE 1.5J.035X260 (WIRE) ×1 IMPLANT
INQWIRE 1.5J .035X260CM (WIRE) ×2
KIT HEART LEFT (KITS) ×2 IMPLANT
PACK CARDIAC CATHETERIZATION (CUSTOM PROCEDURE TRAY) ×2 IMPLANT
TRANSDUCER W/STOPCOCK (MISCELLANEOUS) ×2 IMPLANT
TUBING CIL FLEX 10 FLL-RA (TUBING) ×2 IMPLANT

## 2020-12-17 NOTE — Interval H&P Note (Signed)
History and Physical Interval Note:  12/17/2020 3:11 PM  Benjamin [name]  has presented today for surgery, with the diagnosis of unstable angina.  The various methods of treatment have been discussed with the patient and family. After consideration of risks, benefits and other options for treatment, the patient has consented to  Procedure(s): LEFT HEART CATH AND CORONARY ANGIOGRAPHY (N/A) as a surgical intervention.  The patient's history has been reviewed, patient examined, no change in status, stable for surgery.  I have reviewed the patient's chart and labs.  Questions were answered to the patient's satisfaction.    Cath Lab Visit (complete for each Cath Lab visit)  Clinical Evaluation Leading to the Procedure:   ACS: Yes.    Non-ACS:    Anginal Classification: CCS III  Anti-ischemic medical therapy: Maximal Therapy (2 or more classes of medications)  Non-Invasive Test Results: No non-invasive testing performed  Prior CABG: No previous CABG       [name] 12/17/2020 3:11 PM

## 2020-12-17 NOTE — Progress Notes (Signed)
Requested early clinic appt with CT surgery to evaluate for CABG.

## 2020-12-17 NOTE — Discharge Instructions (Signed)
NO METFORMIN/ GLUCOPHAGE FOR 2 DAYS   Radial Site Care  This sheet gives you information about how to care for yourself after your procedure. Your health care provider may also give you more specific instructions. If you have problems or questions, contact your health care provider. What can I expect after the procedure? After the procedure, it is common to have:  Bruising and tenderness at the catheter insertion area. Follow these instructions at home: Medicines  Take over-the-counter and prescription medicines only as told by your health care provider. Insertion site care  Follow instructions from your health care provider about how to take care of your insertion site. Make sure you: ? Wash your hands with soap and water before you change your bandage (dressing). If soap and water are not available, use hand sanitizer. ? Change your dressing as told by your health care provider. ? Leave stitches (sutures), skin glue, or adhesive strips in place. These skin closures may need to stay in place for 2 weeks or longer. If adhesive strip edges start to loosen and curl up, you may trim the loose edges. Do not remove adhesive strips completely unless your health care provider tells you to do that.  Check your insertion site every day for signs of infection. Check for: ? Redness, swelling, or pain. ? Fluid or blood. ? Pus or a bad smell. ? Warmth.  Do not take baths, swim, or use a hot tub until your health care provider approves.  You may shower 24-48 hours after the procedure, or as directed by your health care provider. ? Remove the dressing and gently wash the site with plain soap and water. ? Pat the area dry with a clean towel. ? Do not rub the site. That could cause bleeding.  Do not apply powder or lotion to the site. Activity  For 24 hours after the procedure, or as directed by your health care provider: ? Do not flex or bend the affected arm. ? Do not push or pull heavy  objects with the affected arm. ? Do not drive yourself home from the hospital or clinic. You may drive 24 hours after the procedure unless your health care provider tells you not to. ? Do not operate machinery or power tools.  Do not lift anything that is heavier than 10 lb (4.5 kg), or the limit that you are told, until your health care provider says that it is safe.  Ask your health care provider when it is okay to: ? Return to work or school. ? Resume usual physical activities or sports. ? Resume sexual activity.   General instructions  If the catheter site starts to bleed, raise your arm and put firm pressure on the site. If the bleeding does not stop, get help right away. This is a medical emergency.  If you went home on the same day as your procedure, a responsible adult should be with you for the first 24 hours after you arrive home.  Keep all follow-up visits as told by your health care provider. This is important. Contact a health care provider if:  You have a fever.  You have redness, swelling, or yellow drainage around your insertion site. Get help right away if:  You have unusual pain at the radial site.  The catheter insertion area swells very fast.  The insertion area is bleeding, and the bleeding does not stop when you hold steady pressure on the area.  Your arm or hand becomes pale, cool, tingly,  or numb. These symptoms may represent a serious problem that is an emergency. Do not wait to see if the symptoms will go away. Get medical help right away. Call your local emergency services (911 in the [place] Do not drive yourself to the hospital. Summary  After the procedure, it is common to have bruising and tenderness at the site.  Follow instructions from your health care provider about how to take care of your radial site wound. Check the wound every day for signs of infection.  Do not lift anything that is heavier than 10 lb (4.5 kg), or the limit that you are  told, until your health care provider says that it is safe. This information is not intended to replace advice given to you by your health care provider. Make sure you discuss any questions you have with your health care provider. Document Revised: 10/20/2017 Document Reviewed: 10/20/2017 Elsevier Patient Education  2021 [employer].

## 2020-12-18 ENCOUNTER — Other Ambulatory Visit: Payer: Self-pay | Admitting: Cardiology

## 2020-12-18 ENCOUNTER — Encounter (HOSPITAL_COMMUNITY): Payer: Self-pay | Admitting: Cardiology

## 2020-12-23 ENCOUNTER — Other Ambulatory Visit: Payer: Self-pay

## 2020-12-23 ENCOUNTER — Encounter: Payer: Self-pay | Admitting: Thoracic Surgery (Cardiothoracic Vascular Surgery)

## 2020-12-23 ENCOUNTER — Institutional Professional Consult (permissible substitution): Payer: Medicare Other | Admitting: Thoracic Surgery (Cardiothoracic Vascular Surgery)

## 2020-12-23 ENCOUNTER — Other Ambulatory Visit: Payer: Self-pay | Admitting: *Deleted

## 2020-12-23 VITALS — BP 111/55 | HR 57 | Resp 20 | Ht 65.0 in | Wt 139.0 lb

## 2020-12-23 DIAGNOSIS — I2510 Atherosclerotic heart disease of native coronary artery without angina pectoris: Secondary | ICD-10-CM | POA: Diagnosis not present

## 2020-12-23 NOTE — Progress Notes (Signed)
PCP is Alroy Dust, L.Marlou Sa, MD Referring Provider is Martinique, Peter M, MD  Chief Complaint  Patient presents with  . Coronary Artery Disease    Initial surgical consult, Cath 3/22    HPI: Mr. Terrio sent for consultation for three-vessel coronary disease.  Tysheem Accardo is a 75 year old man with a longstanding history of coronary disease.  He had an MI in 1991 and had a stent placed in his proximal LAD.  Later he was found to have a total occlusion of his right coronary artery with left-to-right collaterals.  He also has a past medical history significant for type 2 diabetes without complication, hyperlipidemia, and tobacco abuse (60-pack-year, quit 2 months ago).  A couple of months ago he started having chest pain.  This was primarily exertional.  It began as a burning sensation substernally and radiated to his left neck and left arm.  The pain typically lasted about 5 to 10 minutes.  He did have one episode associated with diaphoresis.  He saw Dr. Martinique.  He was on aspirin, atenolol, and isosorbide.  He was started on Ranexa.  He saw Dr. Martinique again in mid March with 2-week history of worsening chest pain.  The pain was similar in nature but was occurring more frequently and lasting longer.  He also has had a couple of episodes at rest.  Dr. Martinique recommended cardiac catheterization.  Cardiac catheterization on 12/17/2020 revealed severe three-vessel disease with a chronic total obstruction of the right, an 80% ostial LAD lesion and 75% proximal to mid lesion, and a 90% ostial circumflex lesion.  Ejection fraction was preserved at 55 to 65%.  Past Medical History:  Diagnosis Date  . Coronary artery disease    Prior PCI to the prox LAD in 1991; S/P PCI to LCX in October 1991  . Diabetes mellitus    TYPE 2  . History of kidney stones    many yrs ago  . Hyperlipidemia   . Tobacco abuse     Past Surgical History:  Procedure Laterality Date  . COLONOSCOPY WITH PROPOFOL N/A 04/21/2016    Procedure: COLONOSCOPY WITH PROPOFOL;  Surgeon: Garlan Fair, MD;  Location: [facility];  Service: Endoscopy;  Laterality: N/A;  . CORONARY ANGIOPLASTY  05/1990   PROXIMAL LAD  . CORONARY ANGIOPLASTY  06/1990   LEFT CIRCUMFLEX CORONARY  . LEFT HEART CATH AND CORONARY ANGIOGRAPHY N/A 11/18/2018   Procedure: LEFT HEART CATH AND CORONARY ANGIOGRAPHY;  Surgeon: Martinique, Peter M, MD;  Location: [facility];  Service: Cardiovascular;  Laterality: N/A;  . LEFT HEART CATH AND CORONARY ANGIOGRAPHY N/A 12/17/2020   Procedure: LEFT HEART CATH AND CORONARY ANGIOGRAPHY;  Surgeon: Martinique, Peter M, MD;  Location: [facility];  Service: Cardiovascular;  Laterality: N/A;  . UMBILICAL HERNIA REPAIR      Family History  Problem Relation Age of Onset  . Heart disease Sister        had open heart surgery    Social History Social History   Tobacco Use  . Smoking status: Current Every Day Smoker    Packs/day: 1.00    Years: 54.00    Pack years: 54.00    Types: Cigarettes  . Smokeless tobacco: Never Used  Substance Use Topics  . Alcohol use: Yes    Comment: every other week  . Drug use: No    Current Outpatient Medications  Medication Sig Dispense Refill  . aspirin EC 81 MG tablet Take 1 tablet (81 mg total) by  mouth daily. 90 tablet 3  . atenolol (TENORMIN) 25 MG tablet TAKE 1 TABLET(25 MG) BY MOUTH DAILY (Patient taking differently: Take 25 mg by mouth daily.) 90 tablet 3  . atorvastatin (LIPITOR) 80 MG tablet TAKE 1 TABLET BY MOUTH DAILY 90 tablet 3  . FARXIGA 5 MG TABS tablet Take 5 mg by mouth daily.    Marland Kitchen glimepiride (AMARYL) 4 MG tablet Take 4 mg by mouth daily with lunch.     . isosorbide mononitrate (IMDUR) 60 MG 24 hr tablet TAKE 1 TABLET BY MOUTH DAILY 90 tablet 3  . JANUVIA 100 MG tablet Take 100 mg by mouth daily with lunch.     . lisinopril (ZESTRIL) 20 MG tablet TAKE 1/2 TABLET(10 MG) BY MOUTH DAILY (Patient taking differently: Take 10 mg by mouth daily.) 45 tablet 10   . metFORMIN (GLUCOPHAGE) 1000 MG tablet Take 1 tablet (1,000 mg total) by mouth 2 (two) times daily.    . nitroGLYCERIN (NITROSTAT) 0.4 MG SL tablet Place 1 tablet (0.4 mg total) under the tongue every 5 (five) minutes as needed. (Patient taking differently: Place 0.4 mg under the tongue every 5 (five) minutes as needed for chest pain.) 25 tablet 12  . ranolazine (RANEXA) 500 MG 12 hr tablet Take 1 tablet (500 mg total) by mouth daily. 90 tablet 3   Current Facility-Administered Medications  Medication Dose Route Frequency Provider Last Rate Last Admin  . sodium chloride flush (NS) 0.9 % injection 3 mL  3 mL Intravenous Q12H Martinique, Peter M, MD        Allergies  Allergen Reactions  . Sulfa Drugs Cross Reactors Rash    Review of Systems  Constitutional: Positive for activity change, appetite change and fatigue.  HENT: Positive for hearing loss.   Respiratory: Positive for cough and shortness of breath.   Cardiovascular: Positive for chest pain and palpitations. Negative for leg swelling.  Gastrointestinal: Positive for abdominal pain (reflux) and diarrhea.  Genitourinary: Positive for frequency. Negative for hematuria.  Neurological: Positive for dizziness. Negative for seizures and syncope.  Hematological: Bruises/bleeds easily.  All other systems reviewed and are negative.   BP (!) 111/55 (BP Location: Left Arm, Patient Position: Sitting)   Pulse (!) 57   Resp 20   Ht 5\' 5"  (1.651 m)   Wt 139 lb (63 kg)   SpO2 98% Comment: RA  BMI 23.13 kg/m  Physical Exam Vitals reviewed.  Constitutional:      General: He is not in acute distress.    Appearance: Normal appearance.  HENT:     Head: Normocephalic and atraumatic.  Eyes:     General: No scleral icterus.    Extraocular Movements: Extraocular movements intact.  Neck:     Vascular: No carotid bruit.  Cardiovascular:     Rate and Rhythm: Normal rate and regular rhythm.     Heart sounds: Normal heart sounds. No murmur  heard. No friction rub. No gallop.   Pulmonary:     Effort: Pulmonary effort is normal. No respiratory distress.     Breath sounds: Normal breath sounds. No wheezing or rales.  Abdominal:     General: There is no distension.     Palpations: Abdomen is soft.     Tenderness: There is no abdominal tenderness.  Musculoskeletal:        General: No swelling.     Cervical back: Neck supple.  Lymphadenopathy:     Cervical: No cervical adenopathy.  Skin:    General: Skin  is warm and dry.  Neurological:     General: No focal deficit present.     Mental Status: He is alert and oriented to person, place, and time.     Cranial Nerves: No cranial nerve deficit.     Motor: No weakness.    Diagnostic Tests: Cardiac catheterization 12/17/2020  Prox LAD lesion is 45% stenosed.  Prox LAD to Mid LAD lesion is 75% stenosed.  Ost Cx to Prox Cx lesion is 90% stenosed.  Prox Cx to Mid Cx lesion is 60% stenosed.  Prox RCA to Dist RCA lesion is 100% stenosed.  Ost LM to Mid LM lesion is 30% stenosed.  Ost LAD lesion is 80% stenosed.  The left ventricular systolic function is normal.  LV end diastolic pressure is normal.  The left ventricular ejection fraction is 55-65% by visual estimate.   1. Severe 3 vessel obstructive CAD. Patient has CTO of the RCA. Progressive ostial LAD and LCX disease. Severely calcified vessels 2. Normal LV function 3. Normal LVEDP  Plan: recommend referral to CT surgery for consideration of CABG.   I personally reviewed the cardiac catheterization images and concur with the findings as outlined above.  There is three-vessel coronary disease with preserved left-ventricular systolic function.  Impression: Jaedin Regina is a 75 year old man with known coronary disease and multiple cardiac risk factors including type 2 diabetes, hyperlipidemia, and tobacco abuse.  He presents with accelerating unstable coronary syndrome and was found to have severe three-vessel  coronary disease at catheterization.  He does have preserved left-ventricular function.  Coronary artery bypass grafting is indicated for survival benefit and relief of symptoms.  I discussed the general nature of the procedure, including the need for general anesthesia, the incisions to be used, the use of cardiopulmonary bypass and the use of drainage tubes postoperatively with Mr. Economou and his sons.  We discussed the expected hospital stay, overall recovery and short and long term outcomes.  I informed them of the indications, risks, benefits, and alternatives.  They understand the risks include, but are not limited to death, stroke, MI, DVT/PE, bleeding, possible need for transfusion, infections, cardiac arrhythmias, as well as other unforeseeable complications including respiratory, renal, or GI complications.   He accepts the risks and agrees to proceed.  Tobacco abuse-congratulated him on quitting 2 months ago and emphasized the importance of continued abstinence.  Type 2 diabetes- on multiple p.o. medications including glimepiride, Januvia, Metformin, and Farxiga.  Hyperlipidemia-on high-dose Lipitor 80 mg daily.  Recent cholesterol was 129 with an LDL of 49  Plan: Coronary artery bypass grafting x 3-4 on Thursday, 12/26/2020.  Melrose Nakayama, MD [facility] [phone]

## 2020-12-23 NOTE — H&P (View-Only) (Signed)
PCP is Alroy Dust, L.Marlou Sa, MD Referring Provider is Martinique, Peter M, MD  Chief Complaint  Patient presents with  . Coronary Artery Disease    Initial surgical consult, Cath 3/22    HPI: Mr. Whisenant sent for consultation for three-vessel coronary disease.  Benjamin [name] is a 75 year old man with a longstanding history of coronary disease.  He had an MI in 1991 and had a stent placed in his proximal LAD.  Later he was found to have a total occlusion of his right coronary artery with left-to-right collaterals.  He also has a past medical history significant for type 2 diabetes without complication, hyperlipidemia, and tobacco abuse (60-pack-year, quit 2 months ago).  A couple of months ago he started having chest pain.  This was primarily exertional.  It began as a burning sensation substernally and radiated to his left neck and left arm.  The pain typically lasted about 5 to 10 minutes.  He did have one episode associated with diaphoresis.  He saw Dr. Martinique.  He was on aspirin, atenolol, and isosorbide.  He was started on Ranexa.  He saw Dr. Martinique again in mid March with 2-week history of worsening chest pain.  The pain was similar in nature but was occurring more frequently and lasting longer.  He also has had a couple of episodes at rest.  Dr. Martinique recommended cardiac catheterization.  Cardiac catheterization on 12/17/2020 revealed severe three-vessel disease with a chronic total obstruction of the right, an 80% ostial LAD lesion and 75% proximal to mid lesion, and a 90% ostial circumflex lesion.  Ejection fraction was preserved at 55 to 65%.  Past Medical History:  Diagnosis Date  . Coronary artery disease    Prior PCI to the prox LAD in 1991; S/P PCI to LCX in October 1991  . Diabetes mellitus    TYPE 2  . History of kidney stones    many yrs ago  . Hyperlipidemia   . Tobacco abuse     Past Surgical History:  Procedure Laterality Date  . COLONOSCOPY WITH PROPOFOL N/A 04/21/2016    Procedure: COLONOSCOPY WITH PROPOFOL;  Surgeon: Garlan Fair, MD;  Location: [facility];  Service: Endoscopy;  Laterality: N/A;  . CORONARY ANGIOPLASTY  05/1990   PROXIMAL LAD  . CORONARY ANGIOPLASTY  06/1990   LEFT CIRCUMFLEX CORONARY  . LEFT HEART CATH AND CORONARY ANGIOGRAPHY N/A 11/18/2018   Procedure: LEFT HEART CATH AND CORONARY ANGIOGRAPHY;  Surgeon: Martinique, Peter M, MD;  Location: [facility];  Service: Cardiovascular;  Laterality: N/A;  . LEFT HEART CATH AND CORONARY ANGIOGRAPHY N/A 12/17/2020   Procedure: LEFT HEART CATH AND CORONARY ANGIOGRAPHY;  Surgeon: Martinique, Peter M, MD;  Location: [place] [facility];  Service: Cardiovascular;  Laterality: N/A;  . UMBILICAL HERNIA REPAIR      Family History  Problem Relation Age of Onset  . Heart disease Sister        had open heart surgery    Social History Social History   Tobacco Use  . Smoking status: Current Every Day Smoker    Packs/day: 1.00    Years: 54.00    Pack years: 54.00    Types: Cigarettes  . Smokeless tobacco: Never Used  Substance Use Topics  . Alcohol use: Yes    Comment: every other week  . Drug use: No    Current Outpatient Medications  Medication Sig Dispense Refill  . aspirin EC 81 MG tablet Take 1 tablet (81 mg total) by  mouth daily. 90 tablet 3  . atenolol (TENORMIN) 25 MG tablet TAKE 1 TABLET(25 MG) BY MOUTH DAILY (Patient taking differently: Take 25 mg by mouth daily.) 90 tablet 3  . atorvastatin (LIPITOR) 80 MG tablet TAKE 1 TABLET BY MOUTH DAILY 90 tablet 3  . FARXIGA 5 MG TABS tablet Take 5 mg by mouth daily.    Marland Kitchen glimepiride (AMARYL) 4 MG tablet Take 4 mg by mouth daily with lunch.     . isosorbide mononitrate (IMDUR) 60 MG 24 hr tablet TAKE 1 TABLET BY MOUTH DAILY 90 tablet 3  . JANUVIA 100 MG tablet Take 100 mg by mouth daily with lunch.     . lisinopril (ZESTRIL) 20 MG tablet TAKE 1/2 TABLET(10 MG) BY MOUTH DAILY (Patient taking differently: Take 10 mg by mouth daily.) 45 tablet 10   . metFORMIN (GLUCOPHAGE) 1000 MG tablet Take 1 tablet (1,000 mg total) by mouth 2 (two) times daily.    . nitroGLYCERIN (NITROSTAT) 0.4 MG SL tablet Place 1 tablet (0.4 mg total) under the tongue every 5 (five) minutes as needed. (Patient taking differently: Place 0.4 mg under the tongue every 5 (five) minutes as needed for chest pain.) 25 tablet 12  . ranolazine (RANEXA) 500 MG 12 hr tablet Take 1 tablet (500 mg total) by mouth daily. 90 tablet 3   Current Facility-Administered Medications  Medication Dose Route Frequency Provider Last Rate Last Admin  . sodium chloride flush (NS) 0.9 % injection 3 mL  3 mL Intravenous Q12H Martinique, Peter M, MD        Allergies  Allergen Reactions  . Sulfa Drugs Cross Reactors Rash    Review of Systems  Constitutional: Positive for activity change, appetite change and fatigue.  HENT: Positive for hearing loss.   Respiratory: Positive for cough and shortness of breath.   Cardiovascular: Positive for chest pain and palpitations. Negative for leg swelling.  Gastrointestinal: Positive for abdominal pain (reflux) and diarrhea.  Genitourinary: Positive for frequency. Negative for hematuria.  Neurological: Positive for dizziness. Negative for seizures and syncope.  Hematological: Bruises/bleeds easily.  All other systems reviewed and are negative.   BP (!) 111/55 (BP Location: Left Arm, Patient Position: Sitting)   Pulse (!) 57   Resp 20   Ht 5\' 5"  (1.651 m)   Wt 139 lb (63 kg)   SpO2 98% Comment: RA  BMI 23.13 kg/m  Physical Exam Vitals reviewed.  Constitutional:      General: He is not in acute distress.    Appearance: Normal appearance.  HENT:     Head: Normocephalic and atraumatic.  Eyes:     General: No scleral icterus.    Extraocular Movements: Extraocular movements intact.  Neck:     Vascular: No carotid bruit.  Cardiovascular:     Rate and Rhythm: Normal rate and regular rhythm.     Heart sounds: Normal heart sounds. No murmur  heard. No friction rub. No gallop.   Pulmonary:     Effort: Pulmonary effort is normal. No respiratory distress.     Breath sounds: Normal breath sounds. No wheezing or rales.  Abdominal:     General: There is no distension.     Palpations: Abdomen is soft.     Tenderness: There is no abdominal tenderness.  Musculoskeletal:        General: No swelling.     Cervical back: Neck supple.  Lymphadenopathy:     Cervical: No cervical adenopathy.  Skin:    General: Skin  is warm and dry.  Neurological:     General: No focal deficit present.     Mental Status: He is alert and oriented to person, place, and time.     Cranial Nerves: No cranial nerve deficit.     Motor: No weakness.    Diagnostic Tests: Cardiac catheterization 12/17/2020  Prox LAD lesion is 45% stenosed.  Prox LAD to Mid LAD lesion is 75% stenosed.  Ost Cx to Prox Cx lesion is 90% stenosed.  Prox Cx to Mid Cx lesion is 60% stenosed.  Prox RCA to Dist RCA lesion is 100% stenosed.  Ost LM to Mid LM lesion is 30% stenosed.  Ost LAD lesion is 80% stenosed.  The left ventricular systolic function is normal.  LV end diastolic pressure is normal.  The left ventricular ejection fraction is 55-65% by visual estimate.   1. Severe 3 vessel obstructive CAD. Patient has CTO of the RCA. Progressive ostial LAD and LCX disease. Severely calcified vessels 2. Normal LV function 3. Normal LVEDP  Plan: recommend referral to CT surgery for consideration of CABG.   I personally reviewed the cardiac catheterization images and concur with the findings as outlined above.  There is three-vessel coronary disease with preserved left-ventricular systolic function.  Impression: Benjamin [name] is a 75 year old man with known coronary disease and multiple cardiac risk factors including type 2 diabetes, hyperlipidemia, and tobacco abuse.  He presents with accelerating unstable coronary syndrome and was found to have severe three-vessel  coronary disease at catheterization.  He does have preserved left-ventricular function.  Coronary artery bypass grafting is indicated for survival benefit and relief of symptoms.  I discussed the general nature of the procedure, including the need for general anesthesia, the incisions to be used, the use of cardiopulmonary bypass and the use of drainage tubes postoperatively with Mr. Bogard and his sons.  We discussed the expected hospital stay, overall recovery and short and long term outcomes.  I informed them of the indications, risks, benefits, and alternatives.  They understand the risks include, but are not limited to death, stroke, MI, DVT/PE, bleeding, possible need for transfusion, infections, cardiac arrhythmias, as well as other unforeseeable complications including respiratory, renal, or GI complications.   He accepts the risks and agrees to proceed.  Tobacco abuse-congratulated him on quitting 2 months ago and emphasized the importance of continued abstinence.  Type 2 diabetes- on multiple p.o. medications including glimepiride, Januvia, Metformin, and Farxiga.  Hyperlipidemia-on high-dose Lipitor 80 mg daily.  Recent cholesterol was 129 with an LDL of 49  Plan: Coronary artery bypass grafting x 3-4 on Thursday, 12/26/2020.  Melrose Nakayama, MD [facility] [phone]

## 2020-12-24 ENCOUNTER — Other Ambulatory Visit: Payer: Self-pay | Admitting: *Deleted

## 2020-12-24 ENCOUNTER — Other Ambulatory Visit (HOSPITAL_COMMUNITY)
Admission: RE | Admit: 2020-12-24 | Discharge: 2020-12-24 | Disposition: A | Discharge status: Home / Self Care | Payer: Medicare Other | Source: Ambulatory Visit | Attending: Thoracic Surgery (Cardiothoracic Vascular Surgery) | Admitting: Thoracic Surgery (Cardiothoracic Vascular Surgery)

## 2020-12-24 DIAGNOSIS — I25119 Atherosclerotic heart disease of native coronary artery with unspecified angina pectoris: Secondary | ICD-10-CM | POA: Diagnosis not present

## 2020-12-24 DIAGNOSIS — Z7982 Long term (current) use of aspirin: Secondary | ICD-10-CM | POA: Diagnosis not present

## 2020-12-24 DIAGNOSIS — I2510 Atherosclerotic heart disease of native coronary artery without angina pectoris: Secondary | ICD-10-CM

## 2020-12-24 DIAGNOSIS — I10 Essential (primary) hypertension: Secondary | ICD-10-CM | POA: Diagnosis not present

## 2020-12-24 DIAGNOSIS — R001 Bradycardia, unspecified: Secondary | ICD-10-CM | POA: Diagnosis not present

## 2020-12-24 DIAGNOSIS — D62 Acute posthemorrhagic anemia: Secondary | ICD-10-CM | POA: Diagnosis not present

## 2020-12-24 DIAGNOSIS — Z882 Allergy status to sulfonamides status: Secondary | ICD-10-CM | POA: Diagnosis not present

## 2020-12-24 DIAGNOSIS — E11649 Type 2 diabetes mellitus with hypoglycemia without coma: Secondary | ICD-10-CM | POA: Diagnosis not present

## 2020-12-24 DIAGNOSIS — Z20822 Contact with and (suspected) exposure to covid-19: Secondary | ICD-10-CM | POA: Insufficient documentation

## 2020-12-24 DIAGNOSIS — J90 Pleural effusion, not elsewhere classified: Secondary | ICD-10-CM | POA: Diagnosis not present

## 2020-12-24 DIAGNOSIS — J811 Chronic pulmonary edema: Secondary | ICD-10-CM | POA: Diagnosis not present

## 2020-12-24 DIAGNOSIS — I25110 Atherosclerotic heart disease of native coronary artery with unstable angina pectoris: Secondary | ICD-10-CM | POA: Diagnosis not present

## 2020-12-24 DIAGNOSIS — F17210 Nicotine dependence, cigarettes, uncomplicated: Secondary | ICD-10-CM | POA: Diagnosis not present

## 2020-12-24 DIAGNOSIS — I480 Paroxysmal atrial fibrillation: Secondary | ICD-10-CM | POA: Diagnosis not present

## 2020-12-24 DIAGNOSIS — Z01818 Encounter for other preprocedural examination: Secondary | ICD-10-CM | POA: Diagnosis not present

## 2020-12-24 DIAGNOSIS — I517 Cardiomegaly: Secondary | ICD-10-CM | POA: Diagnosis not present

## 2020-12-24 DIAGNOSIS — Z01812 Encounter for preprocedural laboratory examination: Secondary | ICD-10-CM | POA: Insufficient documentation

## 2020-12-24 DIAGNOSIS — R918 Other nonspecific abnormal finding of lung field: Secondary | ICD-10-CM | POA: Diagnosis not present

## 2020-12-24 DIAGNOSIS — E785 Hyperlipidemia, unspecified: Secondary | ICD-10-CM | POA: Diagnosis not present

## 2020-12-24 DIAGNOSIS — R110 Nausea: Secondary | ICD-10-CM | POA: Diagnosis not present

## 2020-12-24 DIAGNOSIS — R079 Chest pain, unspecified: Secondary | ICD-10-CM | POA: Diagnosis not present

## 2020-12-24 DIAGNOSIS — Z951 Presence of aortocoronary bypass graft: Secondary | ICD-10-CM | POA: Diagnosis not present

## 2020-12-24 DIAGNOSIS — E119 Type 2 diabetes mellitus without complications: Secondary | ICD-10-CM | POA: Diagnosis not present

## 2020-12-24 DIAGNOSIS — Z79899 Other long term (current) drug therapy: Secondary | ICD-10-CM | POA: Diagnosis not present

## 2020-12-24 DIAGNOSIS — Z955 Presence of coronary angioplasty implant and graft: Secondary | ICD-10-CM | POA: Diagnosis not present

## 2020-12-24 DIAGNOSIS — J029 Acute pharyngitis, unspecified: Secondary | ICD-10-CM | POA: Diagnosis not present

## 2020-12-24 DIAGNOSIS — Z9889 Other specified postprocedural states: Secondary | ICD-10-CM | POA: Diagnosis not present

## 2020-12-24 DIAGNOSIS — I252 Old myocardial infarction: Secondary | ICD-10-CM | POA: Diagnosis not present

## 2020-12-24 DIAGNOSIS — Z7984 Long term (current) use of oral hypoglycemic drugs: Secondary | ICD-10-CM | POA: Diagnosis not present

## 2020-12-24 DIAGNOSIS — J9811 Atelectasis: Secondary | ICD-10-CM | POA: Diagnosis not present

## 2020-12-24 LAB — SARS CORONAVIRUS 2 (TAT 6-24 HRS): SARS Coronavirus 2: NEGATIVE

## 2020-12-24 NOTE — Progress Notes (Signed)
Surgical Instructions    Your procedure is scheduled on 12/26/20.  Report to [facility] Main Entrance "A" at 06:00 A.M., then check in with the Admitting office.  Call this number if you have problems the morning of surgery:  [phone]   If you have any questions prior to your surgery date call [phone]: Open Monday-Friday 8am-4pm    Remember:  Do not eat or drink after midnight the night before your surgery     Take these medicines the morning of surgery with A SIP OF WATER  acetaminophen (TYLENOL) if needed atenolol (TENORMIN)  atorvastatin (LIPITOR) isosorbide mononitrate (IMDUR) ranolazine (RANEXA) nitroGLYCERIN (NITROSTAT) if needed  As of today, STOP taking any Aspirin (unless otherwise instructed by your surgeon) Aleve, Naproxen, Ibuprofen, Motrin, Advil, Goody's, BC's, all herbal medications, fish oil, and all vitamins.   WHAT DO I DO ABOUT MY DIABETES MEDICATION?   Marland Kitchen Do not take oral diabetes medicines (pills) the morning of surgery.  . THE DAY BEFORE SURGERY, do not take [place].        . THE MORNING OF SURGERY, do not take FARXIGA, glimepiride (AMARYL), JANUVIA, and metFORMIN (GLUCOPHAGE).  . The day of surgery, do not take other diabetes injectables, including Byetta (exenatide), Bydureon (exenatide ER), Victoza (liraglutide), or Trulicity (dulaglutide).  . If your CBG is greater than 220 mg/dL, you may take  of your sliding scale (correction) dose of insulin.   HOW TO MANAGE YOUR DIABETES BEFORE AND AFTER SURGERY  Why is it important to control my blood sugar before and after surgery? . Improving blood sugar levels before and after surgery helps healing and can limit problems. . A way of improving blood sugar control is eating a healthy diet by: o  Eating less sugar and carbohydrates o  Increasing activity/exercise o  Talking with your doctor about reaching your blood sugar goals . High blood sugars (greater than 180 mg/dL) can raise your risk of  infections and slow your recovery, so you will need to focus on controlling your diabetes during the weeks before surgery. . Make sure that the doctor who takes care of your diabetes knows about your planned surgery including the date and location.  How do I manage my blood sugar before surgery? . Check your blood sugar at least 4 times a day, starting 2 days before surgery, to make sure that the level is not too high or low. . Check your blood sugar the morning of your surgery when you wake up and every 2 hours until you get to the Short Stay unit. o If your blood sugar is less than 70 mg/dL, you will need to treat for low blood sugar: - Do not take insulin. - Treat a low blood sugar (less than 70 mg/dL) with  cup of clear juice (cranberry or apple), 4 glucose tablets, OR glucose gel. - Recheck blood sugar in 15 minutes after treatment (to make sure it is greater than 70 mg/dL). If your blood sugar is not greater than 70 mg/dL on recheck, call [phone] for further instructions. . Report your blood sugar to the short stay nurse when you get to Short Stay.  . If you are admitted to the hospital after surgery: o Your blood sugar will be checked by the staff and you will probably be given insulin after surgery (instead of oral diabetes medicines) to make sure you have good blood sugar levels. o The goal for blood sugar control after surgery is 80-180 mg/dL.  Do not wear jewelry, make up, or nail polish            Do not wear lotions, powders, perfumes/colognes, or deodorant.            Men may shave face and neck.            Do not bring valuables to the hospital.            [facility] is not responsible for any belongings or valuables.  Do NOT Smoke (Tobacco/Vaping) or drink Alcohol 24 hours prior to your procedure If you use a CPAP at night, you may bring all equipment for your overnight stay.   Contacts, glasses, dentures or bridgework may not be worn into surgery,  please bring cases for these belongings   For patients admitted to the hospital, discharge time will be determined by your treatment team.   Patients discharged the day of surgery will not be allowed to drive home, and someone needs to stay with them for 24 hours.    Special instructions:   [place]- Preparing For Surgery  Before surgery, you can play an important role. Because skin is not sterile, your skin needs to be as free of germs as possible. You can reduce the number of germs on your skin by washing with CHG (chlorahexidine gluconate) Soap before surgery.  CHG is an antiseptic cleaner which kills germs and bonds with the skin to continue killing germs even after washing.    Oral Hygiene is also important to reduce your risk of infection.  Remember - BRUSH YOUR TEETH THE MORNING OF SURGERY WITH YOUR REGULAR TOOTHPASTE  Please do not use if you have an allergy to CHG or antibacterial soaps. If your skin becomes reddened/irritated stop using the CHG.  Do not shave (including legs and underarms) for at least 48 hours prior to first CHG shower. It is OK to shave your face.  Please follow these instructions carefully.   1. Shower the NIGHT BEFORE SURGERY and the MORNING OF SURGERY  2. If you chose to wash your hair, wash your hair first as usual with your normal shampoo.  3. After you shampoo, rinse your hair and body thoroughly to remove the shampoo.  4. Wash Face and genitals (private parts) with your normal soap.   5.  Shower the NIGHT BEFORE SURGERY and the MORNING OF SURGERY with CHG Soap.   6. Use CHG Soap as you would any other liquid soap. You can apply CHG directly to the skin and wash gently with a scrungie or a clean washcloth.   7. Apply the CHG Soap to your body ONLY FROM THE NECK DOWN.  Do not use on open wounds or open sores. Avoid contact with your eyes, ears, mouth and genitals (private parts). Wash Face and genitals (private parts)  with your normal soap.    8. Wash thoroughly, paying special attention to the area where your surgery will be performed.  9. Thoroughly rinse your body with warm water from the neck down.  10. DO NOT shower/wash with your normal soap after using and rinsing off the CHG Soap.  11. Pat yourself dry with a CLEAN TOWEL.  12. Wear CLEAN PAJAMAS to bed the night before surgery  13. Place CLEAN SHEETS on your bed the night before your surgery  14. DO NOT SLEEP WITH PETS.   Day of Surgery: Take a shower with CHG soap.  Wear Clean/Comfortable clothing the morning of surgery Do not apply  any deodorants/lotions.   Remember to brush your teeth WITH YOUR REGULAR TOOTHPASTE.   Please read over the following fact sheets that you were given.

## 2020-12-25 ENCOUNTER — Other Ambulatory Visit: Payer: Self-pay

## 2020-12-25 ENCOUNTER — Ambulatory Visit (HOSPITAL_COMMUNITY)
Admission: RE | Admit: 2020-12-25 | Discharge: 2020-12-25 | Disposition: A | Discharge status: Home / Self Care | Payer: Medicare Other | Source: Ambulatory Visit | Attending: Thoracic Surgery (Cardiothoracic Vascular Surgery) | Admitting: Thoracic Surgery (Cardiothoracic Vascular Surgery)

## 2020-12-25 ENCOUNTER — Ambulatory Visit (HOSPITAL_BASED_OUTPATIENT_CLINIC_OR_DEPARTMENT_OTHER)
Admission: RE | Admit: 2020-12-25 | Discharge: 2020-12-25 | Disposition: A | Discharge status: Home / Self Care | Payer: Medicare Other | Source: Ambulatory Visit | Attending: Thoracic Surgery (Cardiothoracic Vascular Surgery) | Admitting: Thoracic Surgery (Cardiothoracic Vascular Surgery)

## 2020-12-25 ENCOUNTER — Inpatient Hospital Stay (HOSPITAL_COMMUNITY): Admission: RE | Admit: 2020-12-25 | Payer: Medicare Other | Source: Ambulatory Visit

## 2020-12-25 ENCOUNTER — Encounter (HOSPITAL_COMMUNITY)
Admission: RE | Admit: 2020-12-25 | Discharge: 2020-12-25 | Disposition: A | Discharge status: Home / Self Care | Payer: Medicare Other | Source: Ambulatory Visit | Attending: Thoracic Surgery (Cardiothoracic Vascular Surgery) | Admitting: Thoracic Surgery (Cardiothoracic Vascular Surgery)

## 2020-12-25 ENCOUNTER — Encounter (HOSPITAL_COMMUNITY): Payer: Self-pay

## 2020-12-25 DIAGNOSIS — I2510 Atherosclerotic heart disease of native coronary artery without angina pectoris: Secondary | ICD-10-CM | POA: Diagnosis not present

## 2020-12-25 DIAGNOSIS — Z01818 Encounter for other preprocedural examination: Secondary | ICD-10-CM | POA: Insufficient documentation

## 2020-12-25 HISTORY — DX: Unspecified asthma, uncomplicated: J45.909

## 2020-12-25 HISTORY — DX: Gastro-esophageal reflux disease without esophagitis: K21.9

## 2020-12-25 LAB — PULMONARY FUNCTION TEST
DL/VA % pred: 106 %
DL/VA: 4.31 ml/min/mmHg/L
DLCO cor % pred: 81 %
DLCO cor: 17.27 ml/min/mmHg
DLCO unc % pred: 74 %
DLCO unc: 15.91 ml/min/mmHg
FEF 25-75 Post: 2.24 L/sec
FEF 25-75 Pre: 0.83 L/sec
FEF2575-%Change-Post: 169 %
FEF2575-%Pred-Post: 129 %
FEF2575-%Pred-Pre: 48 %
FEV1-%Change-Post: 30 %
FEV1-%Pred-Post: 91 %
FEV1-%Pred-Pre: 70 %
FEV1-Post: 2.2 L
FEV1-Pre: 1.69 L
FEV1FVC-%Change-Post: 25 %
FEV1FVC-%Pred-Pre: 85 %
FEV6-%Change-Post: 6 %
FEV6-%Pred-Post: 89 %
FEV6-%Pred-Pre: 84 %
FEV6-Post: 2.81 L
FEV6-Pre: 2.65 L
FEV6FVC-%Change-Post: 0 %
FEV6FVC-%Pred-Post: 106 %
FEV6FVC-%Pred-Pre: 105 %
FVC-%Change-Post: 3 %
FVC-%Pred-Post: 83 %
FVC-%Pred-Pre: 80 %
FVC-Post: 2.83 L
FVC-Pre: 2.73 L
Post FEV1/FVC ratio: 78 %
Post FEV6/FVC ratio: 99 %
Pre FEV1/FVC ratio: 62 %
Pre FEV6/FVC Ratio: 98 %
RV % pred: 92 %
RV: 2.14 L
TLC % pred: 82 %
TLC: 4.95 L

## 2020-12-25 LAB — COMPREHENSIVE METABOLIC PANEL
ALT: 47 U/L — ABNORMAL HIGH (ref 0–44)
AST: 32 U/L (ref 15–41)
Albumin: 2.9 g/dL — ABNORMAL LOW (ref 3.5–5.0)
Alkaline Phosphatase: 90 U/L (ref 38–126)
Anion gap: 9 (ref 5–15)
BUN: 16 mg/dL (ref 8–23)
CO2: 18 mmol/L — ABNORMAL LOW (ref 22–32)
Calcium: 9.3 mg/dL (ref 8.9–10.3)
Chloride: 107 mmol/L (ref 98–111)
Creatinine, Ser: 0.95 mg/dL (ref 0.61–1.24)
GFR, Estimated: >60 mL/min (ref >60)
Glucose, Bld: 209 mg/dL — ABNORMAL HIGH (ref 70–99)
Potassium: 5.1 mmol/L (ref 3.5–5.1)
Sodium: 134 mmol/L — ABNORMAL LOW (ref 135–145)
Total Bilirubin: 0.6 mg/dL (ref 0.3–1.2)
Total Protein: 7.4 g/dL (ref 6.5–8.1)

## 2020-12-25 LAB — URINALYSIS, ROUTINE W REFLEX MICROSCOPIC
Bacteria, UA: NONE SEEN
Bilirubin Urine: NEGATIVE
Glucose, UA: >=500 mg/dL — AB
Hgb urine dipstick: NEGATIVE
Ketones, ur: NEGATIVE mg/dL
Leukocytes,Ua: NEGATIVE
Nitrite: NEGATIVE
Protein, ur: NEGATIVE mg/dL
Specific Gravity, Urine: 1.027 (ref 1.005–1.030)
pH: 5 (ref 5.0–8.0)

## 2020-12-25 LAB — BLOOD GAS, ARTERIAL
Acid-base deficit: 4.3 mmol/L — ABNORMAL HIGH (ref 0.0–2.0)
Bicarbonate: 19.6 mmol/L — ABNORMAL LOW (ref 20.0–28.0)
Drawn by: 602861
FIO2: 21
O2 Saturation: 97.9 %
Patient temperature: 37
pCO2 arterial: 32.1 mmHg (ref 32.0–48.0)
pH, Arterial: 7.403 (ref 7.350–7.450)
pO2, Arterial: 113 mmHg — ABNORMAL HIGH (ref 83.0–108.0)

## 2020-12-25 LAB — CBC
HCT: 43.7 % (ref 39.0–52.0)
Hemoglobin: 14 g/dL (ref 13.0–17.0)
MCH: 26.6 pg (ref 26.0–34.0)
MCHC: 32 g/dL (ref 30.0–36.0)
MCV: 82.9 fL (ref 80.0–100.0)
Platelets: 365 10*3/uL (ref 150–400)
RBC: 5.27 MIL/uL (ref 4.22–5.81)
RDW: 16.5 % — ABNORMAL HIGH (ref 11.5–15.5)
WBC: 12.1 10*3/uL — ABNORMAL HIGH (ref 4.0–10.5)
nRBC: 0 % (ref 0.0–0.2)

## 2020-12-25 LAB — SURGICAL PCR SCREEN
MRSA, PCR: NEGATIVE
Staphylococcus aureus: NEGATIVE

## 2020-12-25 LAB — APTT: aPTT: 30 seconds (ref 24–36)

## 2020-12-25 LAB — PROTIME-INR
INR: 1.1 (ref 0.8–1.2)
Prothrombin Time: 13.9 seconds (ref 11.4–15.2)

## 2020-12-25 LAB — HEMOGLOBIN A1C
Hgb A1c MFr Bld: 8.5 % — ABNORMAL HIGH (ref 4.8–5.6)
Mean Plasma Glucose: 197.25 mg/dL

## 2020-12-25 LAB — GLUCOSE, CAPILLARY: Glucose-Capillary: 204 mg/dL — ABNORMAL HIGH (ref 70–99)

## 2020-12-25 MED ORDER — SODIUM CHLORIDE 0.9 % IV SOLN
750.0000 mg | INTRAVENOUS | Status: AC
  Administered 2020-12-26: 750 mg via INTRAVENOUS
  Filled 2020-12-25: qty 750

## 2020-12-25 MED ORDER — PLASMA-LYTE 148 IV SOLN
INTRAVENOUS | Status: DC
  Filled 2020-12-25: qty 2.5

## 2020-12-25 MED ORDER — INSULIN REGULAR(HUMAN) IN NACL 100-0.9 UT/100ML-% IV SOLN
INTRAVENOUS | Status: AC
  Administered 2020-12-26: 4.6 [IU]/h via INTRAVENOUS
  Filled 2020-12-25: qty 100

## 2020-12-25 MED ORDER — EPINEPHRINE HCL 5 MG/250ML IV SOLN IN NS
0.0000 ug/min | INTRAVENOUS | Status: DC
  Filled 2020-12-25: qty 250

## 2020-12-25 MED ORDER — TRANEXAMIC ACID 1000 MG/10ML IV SOLN
1.5000 mg/kg/h | INTRAVENOUS | Status: AC
  Administered 2020-12-26: 1.5 mg/kg/h via INTRAVENOUS
  Filled 2020-12-25: qty 25

## 2020-12-25 MED ORDER — SODIUM CHLORIDE 0.9 % IV SOLN
1.5000 g | INTRAVENOUS | Status: AC
  Administered 2020-12-26: 1.5 g via INTRAVENOUS
  Filled 2020-12-25: qty 1.5

## 2020-12-25 MED ORDER — TRANEXAMIC ACID (OHS) PUMP PRIME SOLUTION
2.0000 mg/kg | INTRAVENOUS | Status: DC
  Filled 2020-12-25: qty 1.25

## 2020-12-25 MED ORDER — NOREPINEPHRINE 4 MG/250ML-% IV SOLN
0.0000 ug/min | INTRAVENOUS | Status: DC
  Filled 2020-12-25: qty 250

## 2020-12-25 MED ORDER — MILRINONE LACTATE IN DEXTROSE 20-5 MG/100ML-% IV SOLN
0.3000 ug/kg/min | INTRAVENOUS | Status: DC
  Filled 2020-12-25: qty 100

## 2020-12-25 MED ORDER — NITROGLYCERIN IN D5W 200-5 MCG/ML-% IV SOLN
2.0000 ug/min | INTRAVENOUS | Status: DC
  Filled 2020-12-25: qty 250

## 2020-12-25 MED ORDER — VANCOMYCIN HCL 1250 MG/250ML IV SOLN
1250.0000 mg | INTRAVENOUS | Status: AC
  Administered 2020-12-26: 1250 mg via INTRAVENOUS
  Filled 2020-12-25: qty 250

## 2020-12-25 MED ORDER — SODIUM CHLORIDE 0.9 % IV SOLN
INTRAVENOUS | Status: DC
  Filled 2020-12-25: qty 30

## 2020-12-25 MED ORDER — POTASSIUM CHLORIDE 2 MEQ/ML IV SOLN
80.0000 meq | INTRAVENOUS | Status: DC
  Filled 2020-12-25: qty 40

## 2020-12-25 MED ORDER — PHENYLEPHRINE HCL-NACL 20-0.9 MG/250ML-% IV SOLN
30.0000 ug/min | INTRAVENOUS | Status: AC
  Administered 2020-12-26: 50 ug/min via INTRAVENOUS
  Administered 2020-12-26: 25 ug/min via INTRAVENOUS
  Filled 2020-12-25: qty 250

## 2020-12-25 MED ORDER — DEXMEDETOMIDINE HCL IN NACL 400 MCG/100ML IV SOLN
0.1000 ug/kg/h | INTRAVENOUS | Status: AC
  Administered 2020-12-26: .5 ug/kg/h via INTRAVENOUS
  Filled 2020-12-25: qty 100

## 2020-12-25 MED ORDER — TRANEXAMIC ACID (OHS) BOLUS VIA INFUSION
15.0000 mg/kg | INTRAVENOUS | Status: AC
  Administered 2020-12-26: 939 mg via INTRAVENOUS
  Filled 2020-12-25: qty 939

## 2020-12-25 MED ORDER — MAGNESIUM SULFATE 50 % IJ SOLN
40.0000 meq | INTRAMUSCULAR | Status: DC
  Filled 2020-12-25: qty 9.85

## 2020-12-25 MED ORDER — ALBUTEROL SULFATE (2.5 MG/3ML) 0.083% IN NEBU
2.5000 mg | INHALATION_SOLUTION | Freq: Once | RESPIRATORY_TRACT | Status: AC
  Administered 2020-12-25: 2.5 mg via RESPIRATORY_TRACT

## 2020-12-25 NOTE — Progress Notes (Signed)
Pre-CABG vascular workup completed    Please see CV Proc for preliminary results.   Vonzell Schlatter, RVT

## 2020-12-25 NOTE — Progress Notes (Addendum)
Patient denies shortness of breath, fever, cough or chest pain.  PCP - Dr Donnie Coffin Cardiologist - Dr Peter Martinique  Chest x-ray - 12/25/20 EKG - 12/16/20 Stress Test - 11/09/18 ECHO - n/a Cardiac Cath - 12/17/20  Fasting Blood Sugar - 140-160s Checks Blood Sugar 1 time a day  Aspirin Instructions: Follow your surgeon's instructions on when to stop aspirin prior to surgery,  If no instructions were given by your surgeon then you will need to call the office for those instructions.  Anesthesia review: Yes  STOP now taking any Aspirin (unless otherwise instructed by your surgeon), Aleve, Naproxen, Ibuprofen, Motrin, Advil, Goody's, BC's, all herbal medications, fish oil, and all vitamins.   Coronavirus Screening Covid test on 12/24/20 was negative.  Patient verbalized understanding of instructions that were given to them at the PAT appointment.

## 2020-12-25 NOTE — Anesthesia Preprocedure Evaluation (Addendum)
Anesthesia Evaluation  Patient identified by MRN, date of birth, ID band Patient awake    Reviewed: Allergy & Precautions, NPO status , Patient's Chart, lab work & pertinent test results  History of Anesthesia Complications Negative for: history of anesthetic complications  Airway Mallampati: II  TM Distance: >3 FB Neck ROM: Full    Dental  (+) Dental Advisory Given   Pulmonary asthma , former smoker,    Pulmonary exam normal        Cardiovascular hypertension, Pt. on medications and Pt. on home beta blockers + angina + CAD and + Cardiac Stents   Rhythm:Regular Rate:Bradycardia   '22 Carotid US - 1-39% b/l ICAS  '22 Cath -  Prox LAD lesion is 45% stenosed.  Prox LAD to Mid LAD lesion is 75% stenosed.  Ost Cx to Prox Cx lesion is 90% stenosed.  Prox Cx to Mid Cx lesion is 60% stenosed.  Prox RCA to Dist RCA lesion is 100% stenosed.  Ost LM to Mid LM lesion is 30% stenosed.  Ost LAD lesion is 80% stenosed.  The left ventricular systolic function is normal.  LV end diastolic pressure is normal.  The left ventricular ejection fraction is 55-65% by visual estimate.     Neuro/Psych negative neurological ROS  negative psych ROS   GI/Hepatic Neg liver ROS, GERD  Controlled,  Endo/Other  diabetes, Type 2, Oral Hypoglycemic Agents  Renal/GU negative Renal ROS     Musculoskeletal negative musculoskeletal ROS (+)   Abdominal   Peds  Hematology negative hematology ROS (+)   Anesthesia Other Findings Covid test negative   Reproductive/Obstetrics                            Anesthesia Physical Anesthesia Plan  ASA: IV  Anesthesia Plan: General   Post-op Pain Management:    Induction: Intravenous  PONV Risk Score and Plan: 2 and Treatment may vary due to age or medical condition  Airway Management Planned: Oral ETT  Additional Equipment: Arterial line, CVP, PA Cath,  Ultrasound Guidance Line Placement and TEE  Intra-op Plan:   Post-operative Plan: Post-operative intubation/ventilation  Informed Consent: I have reviewed the patients History and Physical, chart, labs and discussed the procedure including the risks, benefits and alternatives for the proposed anesthesia with the patient or authorized representative who has indicated his/her understanding and acceptance.     Dental advisory given  Plan Discussed with: CRNA and Anesthesiologist  Anesthesia Plan Comments:        Anesthesia Quick Evaluation

## 2020-12-26 ENCOUNTER — Inpatient Hospital Stay (HOSPITAL_COMMUNITY)
Admission: RE | Disposition: A | Discharge status: Home / Self Care | Payer: Self-pay | Source: Ambulatory Visit | Attending: Thoracic Surgery (Cardiothoracic Vascular Surgery)

## 2020-12-26 ENCOUNTER — Inpatient Hospital Stay (HOSPITAL_COMMUNITY): Payer: Medicare Other

## 2020-12-26 ENCOUNTER — Inpatient Hospital Stay (HOSPITAL_COMMUNITY): Payer: Medicare Other | Admitting: Physician Assistant

## 2020-12-26 ENCOUNTER — Inpatient Hospital Stay (HOSPITAL_COMMUNITY)
Admission: RE | Admit: 2020-12-26 | Discharge: 2021-01-02 | DRG: 236 | Disposition: A | Discharge status: Home / Self Care | Payer: Medicare Other | Source: Ambulatory Visit | Attending: Thoracic Surgery (Cardiothoracic Vascular Surgery) | Admitting: Thoracic Surgery (Cardiothoracic Vascular Surgery)

## 2020-12-26 ENCOUNTER — Encounter (HOSPITAL_BASED_OUTPATIENT_CLINIC_OR_DEPARTMENT_OTHER): Payer: Medicare Other | Admitting: Cardiovascular Disease

## 2020-12-26 ENCOUNTER — Inpatient Hospital Stay (HOSPITAL_COMMUNITY): Payer: Medicare Other | Admitting: Anesthesiology

## 2020-12-26 ENCOUNTER — Encounter (HOSPITAL_COMMUNITY): Payer: Self-pay | Admitting: Thoracic Surgery (Cardiothoracic Vascular Surgery)

## 2020-12-26 ENCOUNTER — Other Ambulatory Visit: Payer: Self-pay

## 2020-12-26 DIAGNOSIS — I2510 Atherosclerotic heart disease of native coronary artery without angina pectoris: Secondary | ICD-10-CM

## 2020-12-26 DIAGNOSIS — E785 Hyperlipidemia, unspecified: Secondary | ICD-10-CM | POA: Diagnosis present

## 2020-12-26 DIAGNOSIS — Z09 Encounter for follow-up examination after completed treatment for conditions other than malignant neoplasm: Secondary | ICD-10-CM

## 2020-12-26 DIAGNOSIS — Z882 Allergy status to sulfonamides status: Secondary | ICD-10-CM | POA: Diagnosis not present

## 2020-12-26 DIAGNOSIS — Z79899 Other long term (current) drug therapy: Secondary | ICD-10-CM

## 2020-12-26 DIAGNOSIS — J9811 Atelectasis: Secondary | ICD-10-CM | POA: Diagnosis not present

## 2020-12-26 DIAGNOSIS — E11649 Type 2 diabetes mellitus with hypoglycemia without coma: Secondary | ICD-10-CM | POA: Diagnosis present

## 2020-12-26 DIAGNOSIS — F17210 Nicotine dependence, cigarettes, uncomplicated: Secondary | ICD-10-CM | POA: Diagnosis present

## 2020-12-26 DIAGNOSIS — Z951 Presence of aortocoronary bypass graft: Secondary | ICD-10-CM

## 2020-12-26 DIAGNOSIS — Z7984 Long term (current) use of oral hypoglycemic drugs: Secondary | ICD-10-CM

## 2020-12-26 DIAGNOSIS — Z20822 Contact with and (suspected) exposure to covid-19: Secondary | ICD-10-CM | POA: Diagnosis present

## 2020-12-26 DIAGNOSIS — I25110 Atherosclerotic heart disease of native coronary artery with unstable angina pectoris: Principal | ICD-10-CM | POA: Diagnosis present

## 2020-12-26 DIAGNOSIS — R001 Bradycardia, unspecified: Secondary | ICD-10-CM | POA: Diagnosis not present

## 2020-12-26 DIAGNOSIS — Z7982 Long term (current) use of aspirin: Secondary | ICD-10-CM

## 2020-12-26 DIAGNOSIS — R110 Nausea: Secondary | ICD-10-CM | POA: Diagnosis not present

## 2020-12-26 DIAGNOSIS — I252 Old myocardial infarction: Secondary | ICD-10-CM | POA: Diagnosis not present

## 2020-12-26 DIAGNOSIS — J90 Pleural effusion, not elsewhere classified: Secondary | ICD-10-CM

## 2020-12-26 DIAGNOSIS — Z955 Presence of coronary angioplasty implant and graft: Secondary | ICD-10-CM

## 2020-12-26 DIAGNOSIS — I480 Paroxysmal atrial fibrillation: Secondary | ICD-10-CM | POA: Diagnosis not present

## 2020-12-26 DIAGNOSIS — D62 Acute posthemorrhagic anemia: Secondary | ICD-10-CM | POA: Diagnosis not present

## 2020-12-26 HISTORY — PX: TEE WITHOUT CARDIOVERSION: SHX5443

## 2020-12-26 HISTORY — PX: CORONARY ARTERY BYPASS GRAFT: SHX141

## 2020-12-26 LAB — POCT I-STAT 7, (LYTES, BLD GAS, ICA,H+H)
Acid-base deficit: 6 mmol/L — ABNORMAL HIGH (ref 0.0–2.0)
Acid-base deficit: 3 mmol/L — ABNORMAL HIGH (ref 0.0–2.0)
Acid-base deficit: 2 mmol/L (ref 0.0–2.0)
Acid-base deficit: 5 mmol/L — ABNORMAL HIGH (ref 0.0–2.0)
Acid-base deficit: 6 mmol/L — ABNORMAL HIGH (ref 0.0–2.0)
Acid-base deficit: 3 mmol/L — ABNORMAL HIGH (ref 0.0–2.0)
Bicarbonate: 19.8 mmol/L — ABNORMAL LOW (ref 20.0–28.0)
Bicarbonate: 21.2 mmol/L (ref 20.0–28.0)
Bicarbonate: 23.8 mmol/L (ref 20.0–28.0)
Bicarbonate: 20.5 mmol/L (ref 20.0–28.0)
Bicarbonate: 21 mmol/L (ref 20.0–28.0)
Bicarbonate: 21.9 mmol/L (ref 20.0–28.0)
Calcium, Ion: 1.3 mmol/L (ref 1.15–1.40)
Calcium, Ion: 0.98 mmol/L — ABNORMAL LOW (ref 1.15–1.40)
Calcium, Ion: 1.08 mmol/L — ABNORMAL LOW (ref 1.15–1.40)
Calcium, Ion: 1.07 mmol/L — ABNORMAL LOW (ref 1.15–1.40)
Calcium, Ion: 1.17 mmol/L (ref 1.15–1.40)
Calcium, Ion: 1.1 mmol/L — ABNORMAL LOW (ref 1.15–1.40)
HCT: 35 % — ABNORMAL LOW (ref 39.0–52.0)
HCT: 26 % — ABNORMAL LOW (ref 39.0–52.0)
HCT: 24 % — ABNORMAL LOW (ref 39.0–52.0)
HCT: 28 % — ABNORMAL LOW (ref 39.0–52.0)
HCT: 27 % — ABNORMAL LOW (ref 39.0–52.0)
HCT: 26 % — ABNORMAL LOW (ref 39.0–52.0)
Hemoglobin: 11.9 g/dL — ABNORMAL LOW (ref 13.0–17.0)
Hemoglobin: 8.8 g/dL — ABNORMAL LOW (ref 13.0–17.0)
Hemoglobin: 8.2 g/dL — ABNORMAL LOW (ref 13.0–17.0)
Hemoglobin: 9.5 g/dL — ABNORMAL LOW (ref 13.0–17.0)
Hemoglobin: 9.2 g/dL — ABNORMAL LOW (ref 13.0–17.0)
Hemoglobin: 8.8 g/dL — ABNORMAL LOW (ref 13.0–17.0)
O2 Saturation: 99 %
O2 Saturation: 100 %
O2 Saturation: 100 %
O2 Saturation: 100 %
O2 Saturation: 98 %
O2 Saturation: 99 %
Patient temperature: 35.4
Patient temperature: 35.8
Potassium: 4.7 mmol/L (ref 3.5–5.1)
Potassium: 4.3 mmol/L (ref 3.5–5.1)
Potassium: 5.1 mmol/L (ref 3.5–5.1)
Potassium: 5 mmol/L (ref 3.5–5.1)
Potassium: 4.6 mmol/L (ref 3.5–5.1)
Potassium: 4.2 mmol/L (ref 3.5–5.1)
Sodium: 136 mmol/L (ref 135–145)
Sodium: 141 mmol/L (ref 135–145)
Sodium: 138 mmol/L (ref 135–145)
Sodium: 139 mmol/L (ref 135–145)
Sodium: 141 mmol/L (ref 135–145)
Sodium: 140 mmol/L (ref 135–145)
TCO2: 21 mmol/L — ABNORMAL LOW (ref 22–32)
TCO2: 22 mmol/L (ref 22–32)
TCO2: 25 mmol/L (ref 22–32)
TCO2: 22 mmol/L (ref 22–32)
TCO2: 22 mmol/L (ref 22–32)
TCO2: 23 mmol/L (ref 22–32)
pCO2 arterial: 41.1 mmHg (ref 32.0–48.0)
pCO2 arterial: 34.4 mmHg (ref 32.0–48.0)
pCO2 arterial: 41.7 mmHg (ref 32.0–48.0)
pCO2 arterial: 37.7 mmHg (ref 32.0–48.0)
pCO2 arterial: 45.4 mmHg (ref 32.0–48.0)
pCO2 arterial: 36.8 mmHg (ref 32.0–48.0)
pH, Arterial: 7.291 — ABNORMAL LOW (ref 7.350–7.450)
pH, Arterial: 7.398 (ref 7.350–7.450)
pH, Arterial: 7.364 (ref 7.350–7.450)
pH, Arterial: 7.343 — ABNORMAL LOW (ref 7.350–7.450)
pH, Arterial: 7.265 — ABNORMAL LOW (ref 7.350–7.450)
pH, Arterial: 7.378 (ref 7.350–7.450)
pO2, Arterial: 183 mmHg — ABNORMAL HIGH (ref 83.0–108.0)
pO2, Arterial: 314 mmHg — ABNORMAL HIGH (ref 83.0–108.0)
pO2, Arterial: 368 mmHg — ABNORMAL HIGH (ref 83.0–108.0)
pO2, Arterial: 184 mmHg — ABNORMAL HIGH (ref 83.0–108.0)
pO2, Arterial: 118 mmHg — ABNORMAL HIGH (ref 83.0–108.0)
pO2, Arterial: 114 mmHg — ABNORMAL HIGH (ref 83.0–108.0)

## 2020-12-26 LAB — POCT I-STAT, CHEM 8
BUN: 22 mg/dL (ref 8–23)
BUN: 21 mg/dL (ref 8–23)
BUN: 19 mg/dL (ref 8–23)
BUN: 18 mg/dL (ref 8–23)
Calcium, Ion: 1.28 mmol/L (ref 1.15–1.40)
Calcium, Ion: 1.28 mmol/L (ref 1.15–1.40)
Calcium, Ion: 1.12 mmol/L — ABNORMAL LOW (ref 1.15–1.40)
Calcium, Ion: 1.09 mmol/L — ABNORMAL LOW (ref 1.15–1.40)
Chloride: 105 mmol/L (ref 98–111)
Chloride: 106 mmol/L (ref 98–111)
Chloride: 108 mmol/L (ref 98–111)
Chloride: 106 mmol/L (ref 98–111)
Creatinine, Ser: 0.9 mg/dL (ref 0.61–1.24)
Creatinine, Ser: 0.7 mg/dL (ref 0.61–1.24)
Creatinine, Ser: 0.6 mg/dL — ABNORMAL LOW (ref 0.61–1.24)
Creatinine, Ser: 0.6 mg/dL — ABNORMAL LOW (ref 0.61–1.24)
Glucose, Bld: 185 mg/dL — ABNORMAL HIGH (ref 70–99)
Glucose, Bld: 147 mg/dL — ABNORMAL HIGH (ref 70–99)
Glucose, Bld: 131 mg/dL — ABNORMAL HIGH (ref 70–99)
Glucose, Bld: 132 mg/dL — ABNORMAL HIGH (ref 70–99)
HCT: 34 % — ABNORMAL LOW (ref 39.0–52.0)
HCT: 32 % — ABNORMAL LOW (ref 39.0–52.0)
HCT: 23 % — ABNORMAL LOW (ref 39.0–52.0)
HCT: 25 % — ABNORMAL LOW (ref 39.0–52.0)
Hemoglobin: 11.6 g/dL — ABNORMAL LOW (ref 13.0–17.0)
Hemoglobin: 10.9 g/dL — ABNORMAL LOW (ref 13.0–17.0)
Hemoglobin: 7.8 g/dL — ABNORMAL LOW (ref 13.0–17.0)
Hemoglobin: 8.5 g/dL — ABNORMAL LOW (ref 13.0–17.0)
Potassium: 4.8 mmol/L (ref 3.5–5.1)
Potassium: 4.6 mmol/L (ref 3.5–5.1)
Potassium: 4.6 mmol/L (ref 3.5–5.1)
Potassium: 5.1 mmol/L (ref 3.5–5.1)
Sodium: 137 mmol/L (ref 135–145)
Sodium: 137 mmol/L (ref 135–145)
Sodium: 137 mmol/L (ref 135–145)
Sodium: 139 mmol/L (ref 135–145)
TCO2: 21 mmol/L — ABNORMAL LOW (ref 22–32)
TCO2: 22 mmol/L (ref 22–32)
TCO2: 24 mmol/L (ref 22–32)
TCO2: 23 mmol/L (ref 22–32)

## 2020-12-26 LAB — GLUCOSE, CAPILLARY
Glucose-Capillary: 205 mg/dL — ABNORMAL HIGH (ref 70–99)
Glucose-Capillary: 110 mg/dL — ABNORMAL HIGH (ref 70–99)
Glucose-Capillary: 103 mg/dL — ABNORMAL HIGH (ref 70–99)
Glucose-Capillary: 128 mg/dL — ABNORMAL HIGH (ref 70–99)
Glucose-Capillary: 124 mg/dL — ABNORMAL HIGH (ref 70–99)
Glucose-Capillary: 125 mg/dL — ABNORMAL HIGH (ref 70–99)
Glucose-Capillary: 115 mg/dL — ABNORMAL HIGH (ref 70–99)

## 2020-12-26 LAB — CBC
HCT: 29.7 % — ABNORMAL LOW (ref 39.0–52.0)
HCT: 29.2 % — ABNORMAL LOW (ref 39.0–52.0)
Hemoglobin: 9.4 g/dL — ABNORMAL LOW (ref 13.0–17.0)
Hemoglobin: 9.6 g/dL — ABNORMAL LOW (ref 13.0–17.0)
MCH: 27.2 pg (ref 26.0–34.0)
MCH: 27.8 pg (ref 26.0–34.0)
MCHC: 31.6 g/dL (ref 30.0–36.0)
MCHC: 32.9 g/dL (ref 30.0–36.0)
MCV: 85.8 fL (ref 80.0–100.0)
MCV: 84.6 fL (ref 80.0–100.0)
Platelets: 206 10*3/uL (ref 150–400)
Platelets: 203 10*3/uL (ref 150–400)
RBC: 3.46 MIL/uL — ABNORMAL LOW (ref 4.22–5.81)
RBC: 3.45 MIL/uL — ABNORMAL LOW (ref 4.22–5.81)
RDW: 16.3 % — ABNORMAL HIGH (ref 11.5–15.5)
RDW: 16.2 % — ABNORMAL HIGH (ref 11.5–15.5)
WBC: 17.2 10*3/uL — ABNORMAL HIGH (ref 4.0–10.5)
WBC: 13.5 10*3/uL — ABNORMAL HIGH (ref 4.0–10.5)
nRBC: 0 % (ref 0.0–0.2)
nRBC: 0 % (ref 0.0–0.2)

## 2020-12-26 LAB — POCT I-STAT EG7
Acid-base deficit: 4 mmol/L — ABNORMAL HIGH (ref 0.0–2.0)
Bicarbonate: 21.7 mmol/L (ref 20.0–28.0)
Calcium, Ion: 1.03 mmol/L — ABNORMAL LOW (ref 1.15–1.40)
HCT: 25 % — ABNORMAL LOW (ref 39.0–52.0)
Hemoglobin: 8.5 g/dL — ABNORMAL LOW (ref 13.0–17.0)
O2 Saturation: 69 %
Potassium: 4.3 mmol/L (ref 3.5–5.1)
Sodium: 141 mmol/L (ref 135–145)
TCO2: 23 mmol/L (ref 22–32)
pCO2, Ven: 41.3 mmHg — ABNORMAL LOW (ref 44.0–60.0)
pH, Ven: 7.328 (ref 7.250–7.430)
pO2, Ven: 38 mmHg (ref 32.0–45.0)

## 2020-12-26 LAB — BASIC METABOLIC PANEL
Anion gap: 6 (ref 5–15)
BUN: 11 mg/dL (ref 8–23)
CO2: 23 mmol/L (ref 22–32)
Calcium: 7.5 mg/dL — ABNORMAL LOW (ref 8.9–10.3)
Chloride: 105 mmol/L (ref 98–111)
Creatinine, Ser: 0.67 mg/dL (ref 0.61–1.24)
GFR, Estimated: >60 mL/min (ref >60)
Glucose, Bld: 131 mg/dL — ABNORMAL HIGH (ref 70–99)
Potassium: 4.2 mmol/L (ref 3.5–5.1)
Sodium: 134 mmol/L — ABNORMAL LOW (ref 135–145)

## 2020-12-26 LAB — ECHO INTRAOPERATIVE TEE
Height: 64.5 in
Weight: 2160 oz

## 2020-12-26 LAB — PLATELET COUNT: Platelets: 236 10*3/uL (ref 150–400)

## 2020-12-26 LAB — PROTIME-INR
INR: 1.6 — ABNORMAL HIGH (ref 0.8–1.2)
Prothrombin Time: 18.1 seconds — ABNORMAL HIGH (ref 11.4–15.2)

## 2020-12-26 LAB — ABO/RH: ABO/RH(D): A POS

## 2020-12-26 LAB — APTT: aPTT: 34 seconds (ref 24–36)

## 2020-12-26 LAB — PREPARE RBC (CROSSMATCH)

## 2020-12-26 LAB — HEMOGLOBIN AND HEMATOCRIT, BLOOD
HCT: 26.8 % — ABNORMAL LOW (ref 39.0–52.0)
Hemoglobin: 8.7 g/dL — ABNORMAL LOW (ref 13.0–17.0)

## 2020-12-26 LAB — MAGNESIUM: Magnesium: 2.8 mg/dL — ABNORMAL HIGH (ref 1.7–2.4)

## 2020-12-26 SURGERY — CORONARY ARTERY BYPASS GRAFTING (CABG)
Anesthesia: General | Site: Chest

## 2020-12-26 MED ORDER — SODIUM CHLORIDE 0.9% FLUSH
3.0000 mL | Freq: Two times a day (BID) | INTRAVENOUS | Status: DC
  Administered 2020-12-27 – 2020-12-29 (×6): 3 mL via INTRAVENOUS

## 2020-12-26 MED ORDER — LACTATED RINGERS IV SOLN: INTRAVENOUS | Status: DC | PRN

## 2020-12-26 MED ORDER — VANCOMYCIN HCL IN DEXTROSE 1-5 GM/200ML-% IV SOLN
1000.0000 mg | Freq: Once | INTRAVENOUS | Status: AC
  Administered 2020-12-26: 1000 mg via INTRAVENOUS
  Filled 2020-12-26: qty 200

## 2020-12-26 MED ORDER — ATORVASTATIN CALCIUM 80 MG PO TABS
80.0000 mg | ORAL_TABLET | Freq: Every day | ORAL | Status: DC
  Administered 2020-12-27 – 2021-01-02 (×7): 80 mg via ORAL
  Filled 2020-12-26 (×7): qty 1

## 2020-12-26 MED ORDER — METOPROLOL TARTRATE 12.5 MG HALF TABLET: 12.5000 mg | ORAL_TABLET | Freq: Two times a day (BID) | ORAL | Status: DC

## 2020-12-26 MED ORDER — PROTAMINE SULFATE 10 MG/ML IV SOLN
INTRAVENOUS | Status: DC | PRN
  Administered 2020-12-26: 180 mg via INTRAVENOUS
  Administered 2020-12-26: 30 mg via INTRAVENOUS

## 2020-12-26 MED ORDER — PHENYLEPHRINE HCL-NACL 20-0.9 MG/250ML-% IV SOLN: 0.0000 ug/min | INTRAVENOUS | Status: DC

## 2020-12-26 MED ORDER — HEMOSTATIC AGENTS (NO CHARGE) OPTIME
TOPICAL | Status: DC | PRN
  Administered 2020-12-26: 1 via TOPICAL

## 2020-12-26 MED ORDER — LACTATED RINGERS IV SOLN
500.0000 mL | Freq: Once | INTRAVENOUS | Status: AC | PRN
  Administered 2020-12-26: 500 mL via INTRAVENOUS

## 2020-12-26 MED ORDER — ALBUMIN HUMAN 5 % IV SOLN: INTRAVENOUS | Status: DC | PRN

## 2020-12-26 MED ORDER — PROPOFOL 10 MG/ML IV BOLUS
INTRAVENOUS | Status: DC | PRN
  Administered 2020-12-26: 30 mg via INTRAVENOUS

## 2020-12-26 MED ORDER — ASPIRIN 81 MG PO CHEW
324.0000 mg | CHEWABLE_TABLET | Freq: Every day | ORAL | Status: DC
  Administered 2020-12-28: 324 mg
  Filled 2020-12-26: qty 4

## 2020-12-26 MED ORDER — CHLORHEXIDINE GLUCONATE CLOTH 2 % EX PADS
6.0000 | MEDICATED_PAD | Freq: Every day | CUTANEOUS | Status: DC
  Administered 2020-12-26 – 2021-01-01 (×6): 6 via TOPICAL

## 2020-12-26 MED ORDER — TRAMADOL HCL 50 MG PO TABS
50.0000 mg | ORAL_TABLET | ORAL | Status: DC | PRN
  Administered 2020-12-28: 50 mg via ORAL
  Filled 2020-12-26: qty 1
  Filled 2020-12-26: qty 2

## 2020-12-26 MED ORDER — ROCURONIUM BROMIDE 10 MG/ML (PF) SYRINGE
PREFILLED_SYRINGE | INTRAVENOUS | Status: AC
  Filled 2020-12-26: qty 10

## 2020-12-26 MED ORDER — DOCUSATE SODIUM 100 MG PO CAPS
200.0000 mg | ORAL_CAPSULE | Freq: Every day | ORAL | Status: DC
  Administered 2020-12-29: 200 mg via ORAL
  Filled 2020-12-26 (×2): qty 2

## 2020-12-26 MED ORDER — FAMOTIDINE IN NACL 20-0.9 MG/50ML-% IV SOLN
20.0000 mg | Freq: Two times a day (BID) | INTRAVENOUS | Status: AC
  Administered 2020-12-26 (×2): 20 mg via INTRAVENOUS
  Filled 2020-12-26 (×2): qty 50

## 2020-12-26 MED ORDER — INSULIN REGULAR(HUMAN) IN NACL 100-0.9 UT/100ML-% IV SOLN
INTRAVENOUS | Status: DC
  Administered 2020-12-26: 2.2 [IU]/h via INTRAVENOUS

## 2020-12-26 MED ORDER — METOPROLOL TARTRATE 5 MG/5ML IV SOLN
2.5000 mg | INTRAVENOUS | Status: DC | PRN
  Administered 2020-12-28: 2.5 mg via INTRAVENOUS
  Filled 2020-12-26: qty 5

## 2020-12-26 MED ORDER — SODIUM CHLORIDE 0.9% IV SOLUTION: Freq: Once | INTRAVENOUS | Status: DC

## 2020-12-26 MED ORDER — SODIUM CHLORIDE 0.9 % IV SOLN
1.5000 g | Freq: Two times a day (BID) | INTRAVENOUS | Status: AC
  Administered 2020-12-26 – 2020-12-28 (×4): 1.5 g via INTRAVENOUS
  Filled 2020-12-26 (×5): qty 1.5

## 2020-12-26 MED ORDER — ACETAMINOPHEN 500 MG PO TABS
1000.0000 mg | ORAL_TABLET | Freq: Four times a day (QID) | ORAL | Status: AC
  Administered 2020-12-27 – 2020-12-30 (×9): 1000 mg via ORAL
  Filled 2020-12-26 (×12): qty 2

## 2020-12-26 MED ORDER — CHLORHEXIDINE GLUCONATE 4 % EX LIQD: 30.0000 mL | CUTANEOUS | Status: DC

## 2020-12-26 MED ORDER — EPHEDRINE SULFATE-NACL 50-0.9 MG/10ML-% IV SOSY
PREFILLED_SYRINGE | INTRAVENOUS | Status: DC | PRN
  Administered 2020-12-26 (×2): 5 mg via INTRAVENOUS
  Administered 2020-12-26: 10 mg via INTRAVENOUS

## 2020-12-26 MED ORDER — MIDAZOLAM HCL 2 MG/2ML IJ SOLN: 2.0000 mg | INTRAMUSCULAR | Status: DC | PRN

## 2020-12-26 MED ORDER — SODIUM CHLORIDE 0.9% FLUSH
10.0000 mL | Freq: Two times a day (BID) | INTRAVENOUS | Status: DC
  Administered 2020-12-26: 20 mL
  Administered 2020-12-26 – 2020-12-29 (×7): 10 mL

## 2020-12-26 MED ORDER — SODIUM CHLORIDE 0.9 % IV SOLN: INTRAVENOUS | Status: DC | PRN

## 2020-12-26 MED ORDER — PROPOFOL 10 MG/ML IV BOLUS
INTRAVENOUS | Status: AC
  Filled 2020-12-26: qty 20

## 2020-12-26 MED ORDER — ACETAMINOPHEN 160 MG/5ML PO SOLN
650.0000 mg | Freq: Once | ORAL | Status: AC
  Administered 2020-12-26: 650 mg
  Filled 2020-12-26: qty 20.3

## 2020-12-26 MED ORDER — ACETAMINOPHEN 160 MG/5ML PO SOLN: 1000.0000 mg | Freq: Four times a day (QID) | ORAL | Status: AC

## 2020-12-26 MED ORDER — BISACODYL 5 MG PO TBEC
10.0000 mg | DELAYED_RELEASE_TABLET | Freq: Every day | ORAL | Status: DC
  Administered 2020-12-27 – 2020-12-29 (×2): 10 mg via ORAL
  Filled 2020-12-26 (×2): qty 2

## 2020-12-26 MED ORDER — MORPHINE SULFATE (PF) 2 MG/ML IV SOLN
1.0000 mg | INTRAVENOUS | Status: DC | PRN
  Administered 2020-12-26 – 2020-12-28 (×8): 2 mg via INTRAVENOUS
  Filled 2020-12-26 (×8): qty 1

## 2020-12-26 MED ORDER — CHLORHEXIDINE GLUCONATE 0.12 % MT SOLN
15.0000 mL | OROMUCOSAL | Status: AC
  Administered 2020-12-26: 15 mL via OROMUCOSAL

## 2020-12-26 MED ORDER — FENTANYL CITRATE (PF) 250 MCG/5ML IJ SOLN
INTRAMUSCULAR | Status: AC
  Filled 2020-12-26: qty 25

## 2020-12-26 MED ORDER — ONDANSETRON HCL 4 MG/2ML IJ SOLN
4.0000 mg | Freq: Four times a day (QID) | INTRAMUSCULAR | Status: DC | PRN
  Administered 2020-12-27 – 2020-12-28 (×3): 4 mg via INTRAVENOUS
  Filled 2020-12-26 (×3): qty 2

## 2020-12-26 MED ORDER — ORAL CARE MOUTH RINSE
15.0000 mL | OROMUCOSAL | Status: DC
  Administered 2020-12-26: 15 mL via OROMUCOSAL

## 2020-12-26 MED ORDER — MAGNESIUM SULFATE 4 GM/100ML IV SOLN
4.0000 g | Freq: Once | INTRAVENOUS | Status: AC
  Administered 2020-12-26: 4 g via INTRAVENOUS
  Filled 2020-12-26: qty 100

## 2020-12-26 MED ORDER — SODIUM BICARBONATE 8.4 % IV SOLN
50.0000 meq | Freq: Once | INTRAVENOUS | Status: AC
  Administered 2020-12-26: 50 meq via INTRAVENOUS

## 2020-12-26 MED ORDER — LACTATED RINGERS IV SOLN: INTRAVENOUS | Status: DC

## 2020-12-26 MED ORDER — MIDAZOLAM HCL 5 MG/5ML IJ SOLN
INTRAMUSCULAR | Status: DC | PRN
  Administered 2020-12-26: 3 mg via INTRAVENOUS
  Administered 2020-12-26: 2 mg via INTRAVENOUS
  Administered 2020-12-26 (×2): 1 mg via INTRAVENOUS

## 2020-12-26 MED ORDER — SODIUM CHLORIDE 0.45 % IV SOLN: INTRAVENOUS | Status: DC | PRN

## 2020-12-26 MED ORDER — ROCURONIUM BROMIDE 10 MG/ML (PF) SYRINGE
PREFILLED_SYRINGE | INTRAVENOUS | Status: AC
  Filled 2020-12-26: qty 20

## 2020-12-26 MED ORDER — ASPIRIN EC 325 MG PO TBEC
325.0000 mg | DELAYED_RELEASE_TABLET | Freq: Every day | ORAL | Status: DC
  Administered 2020-12-27 – 2021-01-02 (×6): 325 mg via ORAL
  Filled 2020-12-26 (×6): qty 1

## 2020-12-26 MED ORDER — CHLORHEXIDINE GLUCONATE 0.12% ORAL RINSE (MEDLINE KIT): 15.0000 mL | Freq: Two times a day (BID) | OROMUCOSAL | Status: DC

## 2020-12-26 MED ORDER — ALBUMIN HUMAN 5 % IV SOLN
250.0000 mL | INTRAVENOUS | Status: AC | PRN
Start: 2020-12-26 — End: 2020-12-27
  Administered 2020-12-26 – 2020-12-27 (×4): 12.5 g via INTRAVENOUS
  Filled 2020-12-26 (×2): qty 250

## 2020-12-26 MED ORDER — CHLORHEXIDINE GLUCONATE 0.12 % MT SOLN
15.0000 mL | Freq: Once | OROMUCOSAL | Status: DC
  Filled 2020-12-26: qty 15

## 2020-12-26 MED ORDER — DEXTROSE 50 % IV SOLN: 0.0000 mL | INTRAVENOUS | Status: DC | PRN

## 2020-12-26 MED ORDER — HEPARIN SODIUM (PORCINE) 1000 UNIT/ML IJ SOLN
INTRAMUSCULAR | Status: DC | PRN
  Administered 2020-12-26: 19000 [IU] via INTRAVENOUS
  Administered 2020-12-26: 2000 [IU] via INTRAVENOUS

## 2020-12-26 MED ORDER — PANTOPRAZOLE SODIUM 40 MG PO TBEC
40.0000 mg | DELAYED_RELEASE_TABLET | Freq: Every day | ORAL | Status: DC
  Administered 2020-12-28 – 2021-01-02 (×6): 40 mg via ORAL
  Filled 2020-12-26 (×6): qty 1

## 2020-12-26 MED ORDER — NITROGLYCERIN IN D5W 200-5 MCG/ML-% IV SOLN: 0.0000 ug/min | INTRAVENOUS | Status: DC

## 2020-12-26 MED ORDER — POTASSIUM CHLORIDE 10 MEQ/50ML IV SOLN: 10.0000 meq | INTRAVENOUS | Status: AC

## 2020-12-26 MED ORDER — SODIUM CHLORIDE 0.9 % IV SOLN: 250.0000 mL | INTRAVENOUS | Status: DC

## 2020-12-26 MED ORDER — SODIUM CHLORIDE 0.9 % IV SOLN: INTRAVENOUS | Status: DC

## 2020-12-26 MED ORDER — MIDAZOLAM HCL (PF) 10 MG/2ML IJ SOLN
INTRAMUSCULAR | Status: AC
  Filled 2020-12-26: qty 2

## 2020-12-26 MED ORDER — CHLORHEXIDINE GLUCONATE CLOTH 2 % EX PADS
6.0000 | MEDICATED_PAD | Freq: Every day | CUTANEOUS | Status: DC
  Administered 2020-12-27 – 2020-12-29 (×3): 6 via TOPICAL

## 2020-12-26 MED ORDER — PHENYLEPHRINE 40 MCG/ML (10ML) SYRINGE FOR IV PUSH (FOR BLOOD PRESSURE SUPPORT)
PREFILLED_SYRINGE | INTRAVENOUS | Status: AC
  Filled 2020-12-26: qty 20

## 2020-12-26 MED ORDER — ORAL CARE MOUTH RINSE
15.0000 mL | Freq: Two times a day (BID) | OROMUCOSAL | Status: DC
  Administered 2020-12-26 – 2020-12-29 (×7): 15 mL via OROMUCOSAL

## 2020-12-26 MED ORDER — OXYCODONE HCL 5 MG PO TABS: 5.0000 mg | ORAL_TABLET | ORAL | Status: DC | PRN

## 2020-12-26 MED ORDER — HEPARIN SODIUM (PORCINE) 1000 UNIT/ML IJ SOLN
INTRAMUSCULAR | Status: AC
  Filled 2020-12-26: qty 1

## 2020-12-26 MED ORDER — ACETAMINOPHEN 650 MG RE SUPP: 650.0000 mg | Freq: Once | RECTAL | Status: AC

## 2020-12-26 MED ORDER — DEXMEDETOMIDINE HCL IN NACL 400 MCG/100ML IV SOLN: 0.0000 ug/kg/h | INTRAVENOUS | Status: DC

## 2020-12-26 MED ORDER — METOPROLOL TARTRATE 25 MG/10 ML ORAL SUSPENSION: 12.5000 mg | Freq: Two times a day (BID) | ORAL | Status: DC

## 2020-12-26 MED ORDER — SODIUM CHLORIDE (PF) 0.9 % IJ SOLN
OROMUCOSAL | Status: DC | PRN
  Administered 2020-12-26 (×4): 4 mL via TOPICAL

## 2020-12-26 MED ORDER — METOPROLOL TARTRATE 12.5 MG HALF TABLET: 12.5000 mg | ORAL_TABLET | Freq: Once | ORAL | Status: DC

## 2020-12-26 MED ORDER — ROCURONIUM BROMIDE 10 MG/ML (PF) SYRINGE
PREFILLED_SYRINGE | INTRAVENOUS | Status: DC | PRN
  Administered 2020-12-26 (×2): 50 mg via INTRAVENOUS
  Administered 2020-12-26: 100 mg via INTRAVENOUS

## 2020-12-26 MED ORDER — FENTANYL CITRATE (PF) 250 MCG/5ML IJ SOLN
INTRAMUSCULAR | Status: DC | PRN
  Administered 2020-12-26 (×2): 100 ug via INTRAVENOUS
  Administered 2020-12-26: 200 ug via INTRAVENOUS
  Administered 2020-12-26: 75 ug via INTRAVENOUS
  Administered 2020-12-26 (×2): 100 ug via INTRAVENOUS
  Administered 2020-12-26: 50 ug via INTRAVENOUS
  Administered 2020-12-26: 150 ug via INTRAVENOUS
  Administered 2020-12-26: 25 ug via INTRAVENOUS
  Administered 2020-12-26: 150 ug via INTRAVENOUS

## 2020-12-26 MED ORDER — SODIUM CHLORIDE 0.9% FLUSH: 3.0000 mL | INTRAVENOUS | Status: DC | PRN

## 2020-12-26 MED ORDER — PHENYLEPHRINE 40 MCG/ML (10ML) SYRINGE FOR IV PUSH (FOR BLOOD PRESSURE SUPPORT)
PREFILLED_SYRINGE | INTRAVENOUS | Status: DC | PRN
  Administered 2020-12-26: 120 ug via INTRAVENOUS
  Administered 2020-12-26: 40 ug via INTRAVENOUS
  Administered 2020-12-26: 120 ug via INTRAVENOUS
  Administered 2020-12-26 (×2): 80 ug via INTRAVENOUS
  Administered 2020-12-26: 120 ug via INTRAVENOUS
  Administered 2020-12-26 (×2): 80 ug via INTRAVENOUS

## 2020-12-26 MED ORDER — 0.9 % SODIUM CHLORIDE (POUR BTL) OPTIME
TOPICAL | Status: DC | PRN
  Administered 2020-12-26: 5000 mL

## 2020-12-26 MED ORDER — SODIUM CHLORIDE 0.9% FLUSH
10.0000 mL | INTRAVENOUS | Status: DC | PRN
Start: 2020-12-26 — End: 2020-12-30

## 2020-12-26 MED ORDER — BISACODYL 10 MG RE SUPP: 10.0000 mg | Freq: Every day | RECTAL | Status: DC

## 2020-12-26 SURGICAL SUPPLY — 84 items
ADH SKN CLS APL DERMABOND .7 (GAUZE/BANDAGES/DRESSINGS) ×1
BAG DECANTER FOR FLEXI CONT (MISCELLANEOUS) ×3 IMPLANT
BLADE CLIPPER SURG (BLADE) ×3 IMPLANT
BLADE STERNUM SYSTEM 6 (BLADE) ×3 IMPLANT
BNDG ELASTIC 4X5.8 VLCR STR LF (GAUZE/BANDAGES/DRESSINGS) ×3 IMPLANT
BNDG ELASTIC 6X5.8 VLCR STR LF (GAUZE/BANDAGES/DRESSINGS) ×3 IMPLANT
BNDG GAUZE ELAST 4 BULKY (GAUZE/BANDAGES/DRESSINGS) ×3 IMPLANT
CANISTER SUCT 3000ML PPV (MISCELLANEOUS) ×3 IMPLANT
CANNULA EZ GLIDE AORTIC 21FR (CANNULA) ×3 IMPLANT
CATH CPB KIT HENDRICKSON (MISCELLANEOUS) ×3 IMPLANT
CATH ROBINSON RED A/P 18FR (CATHETERS) ×3 IMPLANT
CATH THORACIC 36FR (CATHETERS) ×3 IMPLANT
CATH THORACIC 36FR RT ANG (CATHETERS) ×3 IMPLANT
CLIP VESOCCLUDE MED 24/CT (CLIP) IMPLANT
CLIP VESOCCLUDE SM WIDE 24/CT (CLIP) ×6 IMPLANT
DERMABOND ADVANCED (GAUZE/BANDAGES/DRESSINGS) ×1
DERMABOND ADVANCED .7 DNX12 (GAUZE/BANDAGES/DRESSINGS) ×2 IMPLANT
DRAPE CARDIOVASCULAR INCISE (DRAPES) ×3
DRAPE SLUSH/WARMER DISC (DRAPES) ×3 IMPLANT
DRAPE SRG 135X102X78XABS (DRAPES) ×2 IMPLANT
DRSG COVADERM 4X14 (GAUZE/BANDAGES/DRESSINGS) ×3 IMPLANT
ELECT CAUTERY BLADE 6.4 (BLADE) ×3 IMPLANT
ELECT REM PT RETURN 9FT ADLT (ELECTROSURGICAL) ×6
ELECTRODE REM PT RTRN 9FT ADLT (ELECTROSURGICAL) ×4 IMPLANT
FELT TEFLON 1X6 (MISCELLANEOUS) ×3 IMPLANT
GAUZE SPONGE 4X4 12PLY STRL (GAUZE/BANDAGES/DRESSINGS) ×6 IMPLANT
GAUZE SPONGE 4X4 12PLY STRL LF (GAUZE/BANDAGES/DRESSINGS) ×6 IMPLANT
GLOVE SURG MICRO LTX SZ6.5 (GLOVE) ×9 IMPLANT
GLOVE SURG SIGNA 7.5 PF LTX (GLOVE) ×9 IMPLANT
GOWN STRL REUS W/ TWL LRG LVL3 (GOWN DISPOSABLE) ×8 IMPLANT
GOWN STRL REUS W/ TWL XL LVL3 (GOWN DISPOSABLE) ×4 IMPLANT
GOWN STRL REUS W/TWL LRG LVL3 (GOWN DISPOSABLE) ×12
GOWN STRL REUS W/TWL XL LVL3 (GOWN DISPOSABLE) ×6
HEMOSTAT POWDER SURGIFOAM 1G (HEMOSTASIS) ×12 IMPLANT
HEMOSTAT SURGICEL 2X14 (HEMOSTASIS) ×3 IMPLANT
KIT BASIN OR (CUSTOM PROCEDURE TRAY) ×3 IMPLANT
KIT CATH SUCT 8FR (CATHETERS) ×3 IMPLANT
KIT SUCTION CATH 14FR (SUCTIONS) ×6 IMPLANT
KIT TURNOVER KIT B (KITS) ×3 IMPLANT
KIT VASOVIEW HEMOPRO 2 VH 4000 (KITS) ×3 IMPLANT
MARKER GRAFT CORONARY BYPASS (MISCELLANEOUS) ×9 IMPLANT
NS IRRIG 1000ML POUR BTL (IV SOLUTION) ×15 IMPLANT
PACK E OPEN HEART (SUTURE) ×3 IMPLANT
PACK OPEN HEART (CUSTOM PROCEDURE TRAY) ×3 IMPLANT
PAD ELECT DEFIB RADIOL ZOLL (MISCELLANEOUS) ×3 IMPLANT
PENCIL BUTTON HOLSTER BLD 10FT (ELECTRODE) ×6 IMPLANT
POSITIONER HEAD DONUT 9IN (MISCELLANEOUS) ×3 IMPLANT
PUNCH AORTIC ROTATE 4.0MM (MISCELLANEOUS) IMPLANT
PUNCH AORTIC ROTATE 4.5MM 8IN (MISCELLANEOUS) ×3 IMPLANT
PUNCH AORTIC ROTATE 5MM 8IN (MISCELLANEOUS) IMPLANT
SET CARDIOPLEGIA MPS 5001102 (MISCELLANEOUS) ×3 IMPLANT
SPONGE LAP 18X18 RF (DISPOSABLE) ×3 IMPLANT
SUPPORT HEART JANKE-BARRON (MISCELLANEOUS) ×3 IMPLANT
SUT BONE WAX W31G (SUTURE) ×3 IMPLANT
SUT MNCRL AB 4-0 PS2 18 (SUTURE) IMPLANT
SUT PROLENE 3 0 SH DA (SUTURE) ×3 IMPLANT
SUT PROLENE 4 0 RB 1 (SUTURE) ×6
SUT PROLENE 4 0 SH DA (SUTURE) IMPLANT
SUT PROLENE 4-0 RB1 .5 CRCL 36 (SUTURE) ×4 IMPLANT
SUT PROLENE 6 0 C 1 30 (SUTURE) ×15 IMPLANT
SUT PROLENE 7 0 BV1 MDA (SUTURE) ×9 IMPLANT
SUT PROLENE 8 0 BV175 6 (SUTURE) IMPLANT
SUT STEEL 6MS V (SUTURE) ×3 IMPLANT
SUT STEEL STERNAL CCS#1 18IN (SUTURE) IMPLANT
SUT STEEL SZ 6 DBL 3X14 BALL (SUTURE) ×3 IMPLANT
SUT VIC AB 1 CTX 36 (SUTURE) ×9
SUT VIC AB 1 CTX36XBRD ANBCTR (SUTURE) ×6 IMPLANT
SUT VIC AB 2-0 CT1 27 (SUTURE) ×3
SUT VIC AB 2-0 CT1 TAPERPNT 27 (SUTURE) ×2 IMPLANT
SUT VIC AB 2-0 CTX 27 (SUTURE) IMPLANT
SUT VIC AB 3-0 SH 27 (SUTURE)
SUT VIC AB 3-0 SH 27X BRD (SUTURE) IMPLANT
SUT VIC AB 3-0 X1 27 (SUTURE) IMPLANT
SUT VICRYL 4-0 PS2 18IN ABS (SUTURE) IMPLANT
SYSTEM SAHARA CHEST DRAIN ATS (WOUND CARE) ×3 IMPLANT
TAPE CLOTH SURG 4X10 WHT LF (GAUZE/BANDAGES/DRESSINGS) ×3 IMPLANT
TAPE PAPER 2X10 WHT MICROPORE (GAUZE/BANDAGES/DRESSINGS) ×3 IMPLANT
TOWEL GREEN STERILE (TOWEL DISPOSABLE) ×3 IMPLANT
TOWEL GREEN STERILE FF (TOWEL DISPOSABLE) ×3 IMPLANT
TRAY FOLEY SLVR 16FR TEMP STAT (SET/KITS/TRAYS/PACK) ×3 IMPLANT
TUBE SUCT INTRACARD DLP 20F (MISCELLANEOUS) ×3 IMPLANT
TUBING LAP HI FLOW INSUFFLATIO (TUBING) ×3 IMPLANT
UNDERPAD 30X36 HEAVY ABSORB (UNDERPADS AND DIAPERS) ×3 IMPLANT
WATER STERILE IRR 1000ML POUR (IV SOLUTION) ×6 IMPLANT

## 2020-12-26 NOTE — Interval H&P Note (Signed)
History and Physical Interval Note:  12/26/2020 7:21 AM  Benjamin [name]  has presented today for surgery, with the diagnosis of CAD.  The various methods of treatment have been discussed with the patient and family. After consideration of risks, benefits and other options for treatment, the patient has consented to  Procedure(s): CORONARY ARTERY BYPASS GRAFTING (CABG) (N/A) TRANSESOPHAGEAL ECHOCARDIOGRAM (TEE) (N/A) as a surgical intervention.  The patient's history has been reviewed, patient examined, no change in status, stable for surgery.  I have reviewed the patient's chart and labs.  Questions were answered to the patient's satisfaction.     Melrose Nakayama

## 2020-12-26 NOTE — Anesthesia Procedure Notes (Signed)
Procedure Name: Intubation Date/Time: 12/26/2020 8:02 AM Performed by: Rande Brunt, CRNA Pre-anesthesia Checklist: Patient identified, Emergency Drugs available, Suction available and Patient being monitored Patient Re-evaluated:Patient Re-evaluated prior to induction Oxygen Delivery Method: Circle System Utilized Preoxygenation: Pre-oxygenation with 100% oxygen Induction Type: IV induction Ventilation: Mask ventilation without difficulty Laryngoscope Size: Mac and 4 Grade View: Grade I Tube type: Oral Tube size: 7.5 mm Number of attempts: 1 Airway Equipment and Method: Stylet Placement Confirmation: ETT inserted through vocal cords under direct vision,  positive ETCO2 and breath sounds checked- equal and bilateral Secured at: 23 cm Tube secured with: Tape Dental Injury: Teeth and Oropharynx as per pre-operative assessment

## 2020-12-26 NOTE — Transfer of Care (Signed)
Immediate Anesthesia Transfer of Care Note  Patient: Benjamin [name]  Procedure(s) Performed: CORONARY ARTERY BYPASS GRAFTING (CABG), ON PUMP, TIMES THREE, USING LEFT INTERNAL MAMMARY ARTERY AND ENDOSCOPICALLY HARVESTED RIGHT GREATER SAPHENOUS VEIN (N/A Chest) TRANSESOPHAGEAL ECHOCARDIOGRAM (TEE) (N/A )  Patient Location: SICU  Anesthesia Type:General  Level of Consciousness: Patient remains intubated per anesthesia plan  Airway & Oxygen Therapy: Patient remains intubated per anesthesia plan and Patient placed on Ventilator (see vital sign flow sheet for setting)  Post-op Assessment: Report given to RN and Post -op Vital signs reviewed and stable  Post vital signs: Reviewed and stable  Last Vitals:  Vitals Value Taken Time  BP 120/65 12/26/20 1312  Temp    Pulse 80 12/26/20 1312  Resp 14 12/26/20 1312  SpO2 98 % 12/26/20 1312  Vitals shown include unvalidated device data.  Last Pain:  Vitals:   12/26/20 5956  TempSrc: Oral         Complications: No complications documented.

## 2020-12-26 NOTE — Anesthesia Procedure Notes (Signed)
Central Venous Catheter Insertion Performed by: Belinda Block, MD, anesthesiologist Start/End3/31/2022 7:15 AM, 12/26/2020 7:30 AM Patient location: Pre-op. Preanesthetic checklist: patient identified, IV checked, site marked, risks and benefits discussed, surgical consent, monitors and equipment checked, pre-op evaluation, timeout performed and anesthesia consent Position: Trendelenburg Lidocaine 1% used for infiltration and patient sedated Hand hygiene performed  and maximum sterile barriers used  Catheter size: 8.5 Fr PA cath was placed.Sheath introducer Swan type:thermodilution Procedure performed without using ultrasound guided technique. Ultrasound Notes:anatomy identified, needle tip was noted to be adjacent to the nerve/plexus identified, no ultrasound evidence of intravascular and/or intraneural injection and image(s) printed for medical record Attempts: 1 Following insertion, line sutured, dressing applied and Biopatch. Post procedure assessment: blood return through all ports  Patient tolerated the procedure well with no immediate complications.

## 2020-12-26 NOTE — Progress Notes (Signed)
With attempting to give patient liquids, patient was coughing and felt choked.   States he has difficulty swallowing pills at home.   RN will not give any more PO and will pass this info off to RN on night shift.

## 2020-12-26 NOTE — Progress Notes (Signed)
RT NOTES: Rapid Wean Protocol initiated.

## 2020-12-26 NOTE — Procedures (Signed)
Extubation Procedure Note  Patient Details:   Name: Benjamin [name] DOB: June 26, 1946 MRN: [id number]   Airway Documentation:   Patient extubated per protocol, placed on 4 lpm nasal cannula. Patient had positive cuff leak prior to extubation. NIF -20. VC 0.8L.  Vent end date: 12/26/20 Vent end time: 1707   Evaluation  O2 sats: stable throughout Complications: No apparent complications Patient did tolerate procedure well. Bilateral Breath Sounds: Clear   Yes  Ander Purpura 12/26/2020, 5:08 PM

## 2020-12-26 NOTE — Brief Op Note (Signed)
12/26/2020  3:42 PM  PATIENT:  Benjamin [name]  75 y.o. male  PRE-OPERATIVE DIAGNOSIS:  CORONARY ARTERY DISEASE  POST-OPERATIVE DIAGNOSIS:  CORONARY ARTERY DISEASE  PROCEDURE:  Procedure(s): CORONARY ARTERY BYPASS GRAFTING (CABG), ON PUMP, TIMES THREE, USING LEFT INTERNAL MAMMARY ARTERY AND ENDOSCOPICALLY HARVESTED RIGHT GREATER SAPHENOUS VEIN (N/A) TRANSESOPHAGEAL ECHOCARDIOGRAM (TEE) (N/A) [place]A-LAD SVG-OM 1 SVG-DIAG 1  EVH 75 MIN  SURGEON:  Surgeon(s) and Role:    * Melrose Nakayama, MD - Primary  PHYSICIAN ASSISTANT: WAYNE GOLD PA-C  ASSISTANTS: STAFF   ANESTHESIA:   general  EBL:  440 mL   BLOOD ADMINISTERED:none  DRAINS: LEFT PLEURAL AND MEDIASTINAL CHEST DRAINS   LOCAL MEDICATIONS USED:  NONE  SPECIMEN:  No Specimen  DISPOSITION OF SPECIMEN:  N/A  COUNTS:  YES  TOURNIQUET:  * No tourniquets in log *  DICTATION: .Other Dictation: Dictation Number PENDING  PLAN OF CARE: Admit to inpatient   PATIENT DISPOSITION:  ICU - intubated and hemodynamically stable.   Delay start of Pharmacological VTE agent (>24hrs) due to surgical blood loss or risk of bleeding: yes  COMPLICATIONS: NO KNOWN  XC= 57 min CPB= 102 min

## 2020-12-26 NOTE — Anesthesia Postprocedure Evaluation (Signed)
Anesthesia Post Note  Patient: Benjamin [name]  Procedure(s) Performed: CORONARY ARTERY BYPASS GRAFTING (CABG), ON PUMP, TIMES THREE, USING LEFT INTERNAL MAMMARY ARTERY AND ENDOSCOPICALLY HARVESTED RIGHT GREATER SAPHENOUS VEIN (N/A Chest) TRANSESOPHAGEAL ECHOCARDIOGRAM (TEE) (N/A )     Patient location during evaluation: ICU Anesthesia Type: General Level of consciousness: sedated and patient remains intubated per anesthesia plan Pain management: pain level controlled Vital Signs Assessment: post-procedure vital signs reviewed and stable Respiratory status: patient remains intubated per anesthesia plan Cardiovascular status: stable Postop Assessment: no apparent nausea or vomiting Anesthetic complications: no   No complications documented.  Last Vitals:  Vitals:   12/26/20 1500 12/26/20 1527  BP: 120/75 (!) 109/55  Pulse: 80 80  Resp: (!) 24 18  Temp: (!) 35.7 C (!) 35.8 C  SpO2: 100% 99%    Last Pain:  Vitals:   12/26/20 2585  TempSrc: Oral                 Audry Pili

## 2020-12-26 NOTE — Progress Notes (Signed)
TCTS Evening Rounds  DOS s/p CABG Extubated one hour ago Alert/oriented Mild upper lip swelling, no airway issues BP 123/90   Pulse 80   Temp (!) 96.26 F (35.7 C)   Resp 16   Ht 5' 4.5" (1.638 m)   Wt 61.2 kg   SpO2 100%   BMI 22.81 kg/m  CTA RRR Low CT output CxR ok   Intake/Output Summary (Last 24 hours) at 12/26/2020 1812 Last data filed at 12/26/2020 1600 Gross per 24 hour  Intake 3676.44 ml  Output 3365 ml  Net 311.44 ml    A/P: continue early postoperative routine care. Continue resuscitation Benjamin [name] Z. Orvan Seen, [place]

## 2020-12-26 NOTE — Anesthesia Procedure Notes (Signed)
Arterial Line Insertion Start/End3/31/2022 6:55 AM, 12/26/2020 7:00 AM Performed by: Rande Brunt, CRNA  Patient location: Pre-op. Preanesthetic checklist: patient identified, IV checked, site marked, risks and benefits discussed, surgical consent, monitors and equipment checked, pre-op evaluation, timeout performed and anesthesia consent Lidocaine 1% used for infiltration Left, radial was placed Catheter size: 20 G Hand hygiene performed  and maximum sterile barriers used  Allen's test indicative of satisfactory collateral circulation Attempts: 1 Procedure performed without using ultrasound guided technique. Following insertion, dressing applied and Biopatch. Post procedure assessment: normal and unchanged  Patient tolerated the procedure well with no immediate complications.

## 2020-12-26 NOTE — Progress Notes (Signed)
  Echocardiogram Echocardiogram Transesophageal has been performed.  Fidel Levy 12/26/2020, 9:35 AM

## 2020-12-27 ENCOUNTER — Encounter (HOSPITAL_COMMUNITY): Payer: Self-pay | Admitting: Thoracic Surgery (Cardiothoracic Vascular Surgery)

## 2020-12-27 ENCOUNTER — Inpatient Hospital Stay (HOSPITAL_COMMUNITY): Payer: Medicare Other

## 2020-12-27 LAB — CBC
HCT: 28 % — ABNORMAL LOW (ref 39.0–52.0)
HCT: 31.5 % — ABNORMAL LOW (ref 39.0–52.0)
Hemoglobin: 9.4 g/dL — ABNORMAL LOW (ref 13.0–17.0)
Hemoglobin: 10.3 g/dL — ABNORMAL LOW (ref 13.0–17.0)
MCH: 28.3 pg (ref 26.0–34.0)
MCH: 27.6 pg (ref 26.0–34.0)
MCHC: 33.6 g/dL (ref 30.0–36.0)
MCHC: 32.7 g/dL (ref 30.0–36.0)
MCV: 84.3 fL (ref 80.0–100.0)
MCV: 84.5 fL (ref 80.0–100.0)
Platelets: 186 10*3/uL (ref 150–400)
Platelets: 217 10*3/uL (ref 150–400)
RBC: 3.32 MIL/uL — ABNORMAL LOW (ref 4.22–5.81)
RBC: 3.73 MIL/uL — ABNORMAL LOW (ref 4.22–5.81)
RDW: 16.3 % — ABNORMAL HIGH (ref 11.5–15.5)
RDW: 16.8 % — ABNORMAL HIGH (ref 11.5–15.5)
WBC: 9.6 10*3/uL (ref 4.0–10.5)
WBC: 12.2 10*3/uL — ABNORMAL HIGH (ref 4.0–10.5)
nRBC: 0 % (ref 0.0–0.2)
nRBC: 0 % (ref 0.0–0.2)

## 2020-12-27 LAB — GLUCOSE, CAPILLARY
Glucose-Capillary: 124 mg/dL — ABNORMAL HIGH (ref 70–99)
Glucose-Capillary: 134 mg/dL — ABNORMAL HIGH (ref 70–99)
Glucose-Capillary: 137 mg/dL — ABNORMAL HIGH (ref 70–99)
Glucose-Capillary: 138 mg/dL — ABNORMAL HIGH (ref 70–99)
Glucose-Capillary: 160 mg/dL — ABNORMAL HIGH (ref 70–99)
Glucose-Capillary: 166 mg/dL — ABNORMAL HIGH (ref 70–99)
Glucose-Capillary: 139 mg/dL — ABNORMAL HIGH (ref 70–99)
Glucose-Capillary: 158 mg/dL — ABNORMAL HIGH (ref 70–99)
Glucose-Capillary: 202 mg/dL — ABNORMAL HIGH (ref 70–99)
Glucose-Capillary: 148 mg/dL — ABNORMAL HIGH (ref 70–99)
Glucose-Capillary: 194 mg/dL — ABNORMAL HIGH (ref 70–99)
Glucose-Capillary: 240 mg/dL — ABNORMAL HIGH (ref 70–99)
Glucose-Capillary: 138 mg/dL — ABNORMAL HIGH (ref 70–99)

## 2020-12-27 LAB — POCT I-STAT 7, (LYTES, BLD GAS, ICA,H+H)
Acid-base deficit: 3 mmol/L — ABNORMAL HIGH (ref 0.0–2.0)
Bicarbonate: 22.8 mmol/L (ref 20.0–28.0)
Calcium, Ion: 1.12 mmol/L — ABNORMAL LOW (ref 1.15–1.40)
HCT: 29 % — ABNORMAL LOW (ref 39.0–52.0)
Hemoglobin: 9.9 g/dL — ABNORMAL LOW (ref 13.0–17.0)
O2 Saturation: 99 %
Patient temperature: 35.5
Potassium: 4.3 mmol/L (ref 3.5–5.1)
Sodium: 139 mmol/L (ref 135–145)
TCO2: 24 mmol/L (ref 22–32)
pCO2 arterial: 38.3 mmHg (ref 32.0–48.0)
pH, Arterial: 7.375 (ref 7.350–7.450)
pO2, Arterial: 134 mmHg — ABNORMAL HIGH (ref 83.0–108.0)

## 2020-12-27 LAB — BASIC METABOLIC PANEL
Anion gap: 5 (ref 5–15)
Anion gap: 9 (ref 5–15)
BUN: 9 mg/dL (ref 8–23)
BUN: 9 mg/dL (ref 8–23)
CO2: 23 mmol/L (ref 22–32)
CO2: 19 mmol/L — ABNORMAL LOW (ref 22–32)
Calcium: 7.5 mg/dL — ABNORMAL LOW (ref 8.9–10.3)
Calcium: 8.2 mg/dL — ABNORMAL LOW (ref 8.9–10.3)
Chloride: 104 mmol/L (ref 98–111)
Chloride: 104 mmol/L (ref 98–111)
Creatinine, Ser: 0.53 mg/dL — ABNORMAL LOW (ref 0.61–1.24)
Creatinine, Ser: 0.82 mg/dL (ref 0.61–1.24)
GFR, Estimated: >60 mL/min (ref >60)
GFR, Estimated: >60 mL/min (ref >60)
Glucose, Bld: 142 mg/dL — ABNORMAL HIGH (ref 70–99)
Glucose, Bld: 168 mg/dL — ABNORMAL HIGH (ref 70–99)
Potassium: 4.4 mmol/L (ref 3.5–5.1)
Potassium: 4.2 mmol/L (ref 3.5–5.1)
Sodium: 132 mmol/L — ABNORMAL LOW (ref 135–145)
Sodium: 132 mmol/L — ABNORMAL LOW (ref 135–145)

## 2020-12-27 LAB — MAGNESIUM
Magnesium: 1.9 mg/dL (ref 1.7–2.4)
Magnesium: 1.9 mg/dL (ref 1.7–2.4)

## 2020-12-27 MED ORDER — INSULIN ASPART 100 UNIT/ML ~~LOC~~ SOLN
0.0000 [IU] | SUBCUTANEOUS | Status: DC
  Administered 2020-12-27: 4 [IU] via SUBCUTANEOUS
  Administered 2020-12-27: 2 [IU] via SUBCUTANEOUS
  Administered 2020-12-27: 8 [IU] via SUBCUTANEOUS
  Administered 2020-12-28 (×2): 2 [IU] via SUBCUTANEOUS
  Administered 2020-12-28: 4 [IU] via SUBCUTANEOUS
  Administered 2020-12-28: 2 [IU] via SUBCUTANEOUS

## 2020-12-27 MED ORDER — METOCLOPRAMIDE HCL 5 MG/ML IJ SOLN
5.0000 mg | Freq: Four times a day (QID) | INTRAMUSCULAR | Status: AC
  Administered 2020-12-27 – 2020-12-28 (×8): 5 mg via INTRAVENOUS
  Filled 2020-12-27 (×8): qty 2

## 2020-12-27 MED ORDER — ENOXAPARIN SODIUM 40 MG/0.4ML ~~LOC~~ SOLN
40.0000 mg | Freq: Every day | SUBCUTANEOUS | Status: DC
  Administered 2020-12-27 – 2020-12-30 (×4): 40 mg via SUBCUTANEOUS
  Filled 2020-12-27 (×4): qty 0.4

## 2020-12-27 MED ORDER — INSULIN DETEMIR 100 UNIT/ML ~~LOC~~ SOLN
20.0000 [IU] | Freq: Two times a day (BID) | SUBCUTANEOUS | Status: DC
  Administered 2020-12-27 – 2020-12-29 (×5): 20 [IU] via SUBCUTANEOUS
  Filled 2020-12-27 (×8): qty 0.2

## 2020-12-27 NOTE — TOC Initial Note (Signed)
Transition of Care [facility]) - Initial/Assessment Note    Patient Details  Name: Benjamin [name] MRN: [id number] Date of Birth: [birth year]  [facility] CM/SW Contact:    Bethena Roys, RN Phone Number: 12/27/2020, 1:10 PM  Clinical Narrative:  Patient presented for CABG. Prior to arrival patient was from home with spouse. RNat the bedside alerted the Case Manager that the son and spouse has questions. Spouse had questions regarding a hospital bed for home. Wife stated that she had called the cardiac thoracic office and Elnita Maxwell in the office stated that the patient could receive a hospital bed while in the hospital. Case Manager visited the patient and family in the room. While speaking with the wife, the wife states she wants her husband to have a fully electric hospital bed and she wants to pay out of pocket. Since she wants a fully electric and wants to pay out of pocket no order is  needed. Case Manager called the Liaison with [employer] to verify that no order is needed. [employer] was provided the wife's telephone number and [employer] should be in contact with wife regarding payment and delivery dates. Per wife, the patient has no other durable medical equipment in the home. Patient will benefit from PT/OT consult for recommendations. Case Manager will continue to follow for additional transition of care needs.               Expected Discharge Plan: [place] Barriers to Discharge: Continued Medical Work up   Patient Goals and CMS Choice Patient states their goals for this hospitalization and ongoing recovery are:: to return home      Expected Discharge Plan and Services Expected Discharge Plan: [place] In-house Referral: NA Discharge Planning Services: CM Consult   Living arrangements for the past 2 months: Single Family Home                  Prior Living Arrangements/Services Living arrangements for the past 2 months: Single  Family Home Lives with:: Spouse Patient language and need for interpreter reviewed:: Yes Do you feel safe going back to the place where you live?: Yes      Need for Family Participation in Patient Care: Yes (Comment) Care giver support system in place?: Yes (comment)   Criminal Activity/Legal Involvement Pertinent to Current Situation/Hospitalization: No - Comment as needed   Permission Sought/Granted Permission sought to share information with : Family Nature conservation officer Permission granted to share information with : Yes, Verbal Permission Granted     Permission granted to share info w AGENCY: [employer]    Emotional Assessment Appearance:: Appears stated age Attitude/Demeanor/Rapport: Engaged Affect (typically observed): Appropriate Orientation: : Oriented to Situation,Oriented to  Time,Oriented to Place,Oriented to Self Alcohol / Substance Use: Not Applicable Psych Involvement: No (comment)  Admission diagnosis:  S/P CABG x 3 [Z95.1] Patient Active Problem List   Diagnosis Date Noted  . S/P CABG x 3 12/26/2020  . Angina pectoris ([place]) 11/18/2018  . Coronary artery disease   . Diabetes mellitus ([place])   . Hyperlipidemia   . Kidney stones   . Tobacco abuse    PCP:  Alroy Dust, L.Marlou Sa, MD Pharmacy:   [facility] [place], [place] [address] Phone: [phone] Fax: [phone]   Readmission Risk Interventions No flowsheet data found.

## 2020-12-27 NOTE — Discharge Summary (Signed)
Physician Discharge Summary  Patient ID: Benjamin [name] MRN: [id number] DOB/AGE: [birth year] 75 y.o.  Admit date: 12/26/2020 Discharge date: 01/02/2021  Admission Diagnoses:  Patient Active Problem List   Diagnosis Date Noted  . Angina pectoris ([place]) 11/18/2018  . Coronary artery disease   . Diabetes mellitus ([place])   . Hyperlipidemia   . Kidney stones   . Tobacco abuse    Discharge Diagnoses:   Patient Active Problem List   Diagnosis Date Noted  . S/P CABG x 3 12/26/2020  . Angina pectoris ([place]) 11/18/2018  . Coronary artery disease   . Diabetes mellitus ([place])   . Hyperlipidemia   . Kidney stones   . Tobacco abuse    Discharged Condition: good   HPI: Mr. Benjamin [name] sent for consultation for three-vessel coronary disease.   Benjamin [name] is a 75 year old man with a longstanding history of coronary disease.  He had an MI in 1991 and had a stent placed in his proximal LAD.  Later he was found to have a total occlusion of his right coronary artery with left-to-right collaterals.  He also has a past medical history significant for type 2 diabetes without complication, hyperlipidemia, and tobacco abuse (60-pack-year, quit 2 months ago).   A couple of months ago he started having chest pain.  This was primarily exertional.  It began as a burning sensation substernally and radiated to his left neck and left arm.  The pain typically lasted about 5 to 10 minutes.  He did have one episode associated with diaphoresis.  He saw Dr. Martinique.  He was on aspirin, atenolol, and isosorbide.  He was started on Ranexa.  He saw Dr. Martinique again in mid March with 2-week history of worsening chest pain.  The pain was similar in nature but was occurring more frequently and lasting longer.  He also has had a couple of episodes at rest.  Dr. Martinique recommended cardiac catheterization.   Cardiac catheterization on 12/17/2020 revealed severe three-vessel disease with a chronic total obstruction of the right, an  80% ostial LAD lesion and 75% proximal to mid lesion, and a 90% ostial circumflex lesion.  Ejection fraction was preserved at 55 to 65%.  After reviewing the patient and all relevant studies the patient was determined to be a adequate candidate to proceed with coronary artery surgical revascularization.  He was admitted this hospitalization for the procedure.    Hospital course:  Patient was admitted and on 12/26/2020 he was taken the operating room where he underwent CABG x3.  He tolerated procedure well was taken to the surgical intensive care unit in stable condition.  Postoperative hospital course:  The patient was extubated using standard protocols without significant difficulty.  He remained hemodynamically stable and Swan and arterial line were removed on postop day 1.  He is noted to be in a sinus bradycardia with a rate in the 40s and it is being temporarily paced with his epicardial wires.  His renal function is noted to be normal.  He is noted to have a mild expected acute blood loss anemia which is being monitored clinically.  Chest tubes were removed on postop day #1.  He did have some early postoperative nausea and was giving a short course of Reglan.  Blood sugars have been under adequate control using standard protocols.  We are holding off on resuming preoperative oral diabetic medications until taking diet adequately.  He developed signs of dysphagia.  SLP evaluation was performed and the patient exhibited evidence  of aspiration risk. On postoperative day #2 he had no focal neurological deficit however was experiencing intermittent confusion.  He has remained in normal sinus rhythm.  Renal function remains within normal limits he does have a mild expected acute blood loss anemia but values are noted to be stable and we are monitoring clinically.  He did have some postoperative leukocytosis consistent with postponement systemic inflammatory response.  It is showing steady improvement over  time.  He is not having fevers or showing evidence of infection.  On postoperative day #3 he did go into a atrial fibrillation rhythm with a rapid ventricular response and was started on amiodarone protocol.  Additionally he was started on oral beta-blocker.  The patient was maintaining NSR and felt stable for transfer to the progressive care unit on 12/29/2020.  The patient continued to work with SLP.  He has been advanced to a regular diet.  The patient complained of shortness of breath while laying flat and trying to sleep.  CXR was obtained and showed Small bilateral pleural effusions.Interval resolution of pulmonary edema compared to 12/30/2020.  The patients mobility was limited.  PT/OT consults were obtained and they recommended no follow-up. The patient will be discharge today with the assistance of his wife and sons. He is reminded to keep a close watch on his blood glucose level and to call our office or his diabetes specialist if he is having high blood glucose readings. He is tolerating room air, his incisions are healing well, and he is ready for discharge home today.   Consults: None  Significant Diagnostic Studies: angiography:    Prox LAD lesion is 45% stenosed.  Prox LAD to Mid LAD lesion is 75% stenosed.  Ost Cx to Prox Cx lesion is 90% stenosed.  Prox Cx to Mid Cx lesion is 60% stenosed.  Prox RCA to Dist RCA lesion is 100% stenosed.  Ost LM to Mid LM lesion is 30% stenosed.  Ost LAD lesion is 80% stenosed.  The left ventricular systolic function is normal.  LV end diastolic pressure is normal.  The left ventricular ejection fraction is 55-65% by visual estimate.   1. Severe 3 vessel obstructive CAD. Patient has CTO of the RCA. Progressive ostial LAD and LCX disease. Severely calcified vessels 2. Normal LV function 3. Normal LVEDP  Treatments: surgery:   Operative Report   DATE OF PROCEDURE: 12/26/2020   PREOPERATIVE DIAGNOSIS:  Three-vessel coronary artery  disease.  POSTOPERATIVE DIAGNOSIS:  Three-vessel coronary artery disease.  PROCEDURES PERFORMED:  Median sternotomy, extracorporeal circulation, coronary artery bypass grafting x3 (left internal mammary artery to LAD, saphenous vein graft to first diagonal, saphenous vein graft to obtuse marginal 1).  Endoscopic vein harvest,  right leg.  SURGEON:  Modesto Charon, MD  ASSISTANT:   Jadene Pierini PA-C  ANESTHESIA:  General. Discharge Exam: Blood pressure 126/73, pulse (!) 57, temperature 98.5 F (36.9 C), temperature source Oral, resp. rate 20, height 5' 4.5" (1.638 m), weight 60 kg, SpO2 93 %.  General appearance: alert, cooperative and no distress Heart: regular rate and rhythm, S1, S2 normal, no murmur, click, rub or gallop Lungs: clear to auscultation bilaterally Abdomen: soft, non-tender; bowel sounds normal; no masses,  no organomegaly Extremities: extremities normal, atraumatic, no cyanosis or edema Wound: clean and dry, infiltrated IV with some tenderness on the right forearm  Disposition: Discharge disposition: 01-Home or Self Care       Discharge Instructions    Amb Referral to Cardiac Rehabilitation   Complete by:  As directed    Diagnosis: CABG   CABG X ___: 3   After initial evaluation and assessments completed: Virtual Based Care may be provided alone or in conjunction with Phase 2 Cardiac Rehab based on patient barriers.: Yes     Allergies as of 01/02/2021      Reactions   Sulfa Drugs Cross Reactors Rash      Medication List    STOP taking these medications   atenolol 25 MG tablet Commonly known as: TENORMIN   Farxiga 5 MG Tabs tablet Generic drug: dapagliflozin propanediol   glimepiride 4 MG tablet Commonly known as: AMARYL   isosorbide mononitrate 60 MG 24 hr tablet Commonly known as: IMDUR   lisinopril 20 MG tablet Commonly known as: ZESTRIL   nitroGLYCERIN 0.4 MG SL tablet Commonly known as: NITROSTAT   ranolazine 500 MG 12 hr  tablet Commonly known as: Ranexa     TAKE these medications   acetaminophen 500 MG tablet Commonly known as: TYLENOL Take 1,000 mg by mouth every 6 (six) hours as needed for moderate pain or headache.   amiodarone 200 MG tablet Commonly known as: PACERONE Take 1 tablet (200 mg total) by mouth 2 (two) times daily.   aspirin 325 MG EC tablet Take 1 tablet (325 mg total) by mouth daily. What changed:   medication strength  how much to take   atorvastatin 80 MG tablet Commonly known as: LIPITOR TAKE 1 TABLET BY MOUTH DAILY   cephALEXin 500 MG capsule Commonly known as: KEFLEX Take 1 capsule (500 mg total) by mouth every 12 (twelve) hours for 4 days.   Januvia 100 MG tablet Generic drug: sitaGLIPtin Take 100 mg by mouth daily with lunch.   metFORMIN 1000 MG tablet Commonly known as: GLUCOPHAGE Take 1 tablet (1,000 mg total) by mouth 2 (two) times daily.   traMADol 50 MG tablet Commonly known as: ULTRAM Take 1 tablet (50 mg total) by mouth every 4 (four) hours as needed for moderate pain.       Follow-up Information    Melrose Nakayama, MD Follow up.   Specialty: Cardiothoracic Surgery Why: Please see discharge paperwork for follow-up appointment with surgeon.  Also obtain a chest x-ray at [facility] 1/2-hour prior to appointment.  It is located in the same office complex.  Also obtain a [place] ACCOUNT TO ASSIST WITH appointments Contact information: [address] [place] [address] [phone]        Martinique, Peter M, MD Follow up.   Specialty: Cardiology Why: Please see discharge paperwork for follow-up appointment with cardiology. Contact information: [place] [address] [phone]        Alroy Dust, L.Marlou Sa, MD. Call in 1 day(s).   Specialty: Family Medicine Contact information: [address] [place] [address] [phone]             The patient has been  discharged on:   1.Beta Blocker:  Yes [   ]                              No   [ x  ]                              If No, reason: sinus brady  2.Ace Inhibitor/ARB: Yes [   ]  No  [  x  ]                                     If No, reason: BP well controlled  3.Statin:   Yes [ x  ]                  No  [   ]                  If No, reason:  4.Ecasa:  Yes  [  x ]                  No   [   ]                  If No, reason:  Signed: Elgie Collard PA-C 01/02/2021, 8:57 AM

## 2020-12-27 NOTE — Op Note (Signed)
NAME: Benjamin [name], Benjamin [name] MEDICAL RECORD NO: [id number] ACCOUNT NO: [id number] DATE OF BIRTH: 08-May-1946 FACILITY: [facility] LOCATION: [facility] PHYSICIAN: Revonda Standard. Roxan Hockey, MD  Operative Report   DATE OF PROCEDURE: 12/26/2020   PREOPERATIVE DIAGNOSIS:  Three-vessel coronary artery disease.  POSTOPERATIVE DIAGNOSIS:  Three-vessel coronary artery disease.  PROCEDURE:   Median sternotomy, extracorporeal circulation, Coronary artery bypass grafting x3  Left internal mammary artery to LAD,  Saphenous vein graft to first diagonal,  Saphenous vein graft to obtuse marginal 1.   Endoscopic vein harvest right leg.  SURGEON:  Modesto Charon, MD  ASSISTANT:  Jadene Pierini, PA-C.  ANESTHESIA:  General.  FINDINGS:  Transesophageal echocardiography revealed preserved left ventricular wall motion with no significant valvular pathology.  Mammary artery good quality. Vein fair quality.  LAD and OM1 good quality, diagonal fair quality, distal branches in right coronary distribution were too small to graft.  CLINICAL NOTE:  Benjamin [name] is a 75 year old man with a longstanding history of coronary artery disease, who presented with accelerating angina. On catheterization, he was found to have severe 3-vessel disease.  Coronary artery bypass grafting was indicated for  survival benefit and relief of symptoms.  The indications, risks, benefits, and alternatives were discussed in detail with the patient.  He understood and accepted the risks and agreed to proceed.   OPERATIVE NOTE:  Benjamin [name] was brought to the preoperative holding area 12/26/2020.  Anesthesia placed a Swan-Ganz catheter and arterial blood pressure monitoring line.  He was taken to the operating room and anesthetized and intubated.  Dr. Fransisco Beau of  Anesthesia performed a transesophageal echocardiography, which revealed preserved left ventricular function with no significant valvular pathology.  While that was being performed a Foley catheter was  placed.  Intravenous antibiotics were administered.   The chest, abdomen and legs were prepped and draped in the usual sterile fashion.  A timeout was performed.  An incision was made in the medial aspect of the right leg at the level of the knee.  The greater saphenous vein was harvested from the upper  calf to the groin endoscopically.  Simultaneously, a median sternotomy was performed and the left internal mammary artery was harvested using standard technique.  The mammary artery was good quality. 5000 units of heparin was administered during the  vessel harvest.  The remainder of full heparin dose was given prior to opening the pericardium.  While the vein was being prepared, the sternal retractor was placed and gently opened over time.  The pericardium was opened.  The ascending aorta was inspected.  There was no palpable atherosclerotic disease.  There was no significant plaque noted on  TEE either.  After confirming adequate anticoagulation with ACT measurement, the aorta was cannulated via concentric 2-0 Ethibond pledgeted pursestring sutures.  A dual stage venous cannula was placed via a pursestring suture in the right atrial  appendage.  Cardiopulmonary bypass was initiated and flows were maintained per protocol.  The coronary arteries were inspected and the anastomotic sites were chosen.  Of note, there were no graftable targets in the right coronary distribution.  The  conduits were prepared.  The vein was inspected.  There were two segments that were suitable for use for the diagonal graft and the OM graft, each of those segments contained an area of varicosities.  Those varicose areas were excised and an end-to-end  anastomosis was done with each vein.  Those were done with running 7-0 Prolene sutures.  A foam pad was placed in the pericardium to  insulate the heart, a temperature probe was placed in the myocardial septum, and a cardioplegia cannula was placed in the  ascending aorta.  The  aorta was cross clamped.  The left ventricle was emptied via the aortic root vent.  Cardiac arrest was achieved with a combination of cold antegrade blood cardioplegia and topical iced saline.  After 750 mL of cardioplegia was administered  there was a rapid diastolic arrest and there was septal cooling to 8 degrees Celsius.  A reversed saphenous vein graft was placed end-to-side to obtuse marginal 1.  This was a 1.5 mm good quality target.  The vein was fair quality.  It was anastomosed end-to-side with a running 7-0 Prolene suture.  A probe passed easily proximally  and distally at the completion of the anastomosis.  Cardioplegia was administered down the graft and there was good flow and good hemostasis.  Next, a reversed saphenous vein graft was placed end-to-side to the first diagonal branch of the LAD.  This was a 1.5 mm fair quality vessel.  Again, the vein was of fair quality.  An end-to-side anastomosis was performed with a running 7-0 Prolene suture.   Again, a probe passed easily distally.  There was disease proximally and the probe did not pass far in that direction before reaching a stenosis.  There was good flow and good hemostasis with cardioplegia administration.  Additional cardioplegia was also administered down the aortic root.  The left internal mammary artery was brought through a window in the pericardium.  The distal end was bevelled.  It was then anastomosed end-to-side to the distal LAD.  The LAD was 2 mm  good quality target at the site of the anastomosis.  The mammary was a 1.5 mm good quality vessel with excellent flow.  An end-to-side anastomosis was performed with a running 8-0 Prolene suture.  After completion of the anastomosis, the bulldog clamp was briefly  removed.  Rapid septal rewarming was noted.  The bulldog clamp was replaced and the mammary pedicle was tacked to the epicardial surface of the heart with 6-0 Prolene sutures.  Additional cardioplegia was  administered.  The cardioplegia cannula then was removed from the ascending aorta.  The vein grafts were cut to length.  The proximal vein graft anastomoses were performed to 4.5 mm punch aortotomies with running 6-0 Prolene  sutures.  After completion of the final proximal anastomosis, the patient was placed in Trendelenburg position.  Lidocaine was administered.  The aortic root was de-aired and the aortic crossclamp was removed.  The total crossclamp time was 57 minutes.   The patient initially fibrillated but then later spontaneously converted to a bradycardic rhythm without need for defibrillation.  While rewarming was completed all proximal and distal anastomoses were inspected for hemostasis.  Epicardial pacing wires  were placed on the right ventricle and right atrium. DDD pacing was initiated for a bradycardic rhythm. When the patient had reached a core temperature of 37 degrees Celsius, he was weaned from cardiopulmonary bypass on the first attempt without  inotropic support.  The total bypass time was 102 minutes.  The initial cardiac index was greater than 2 liters per minute per meter squared and he remained hemodynamically stable throughout the post-bypass period.  A test dose protamine was administered and was well tolerated.  The atrial and aortic cannula were removed.  The remainder of the protamine was administered without incident.  The chest was irrigated with warm saline.  Hemostasis was achieved.  Left  pleural  and mediastinal chest tubes were placed through separate subcostal incisions.  The pericardium was reapproximated over the ascending aorta and base of the heart with interrupted 3-0 silk sutures.  It came together easily without tension.  The  sternum was closed with a combination of single and double heavy gauge stainless steel wires.  Pectoralis fascia, subcutaneous tissue and skin were closed in standard fashion.  All sponge, needle and instrument counts were correct at the  end of the  procedure, the patient was taken from the operating room to the surgical intensive care unit, intubated and in good condition.   SUJ D: 12/26/2020 3:52:29 pm T: 12/27/2020 8:45:00 am  JOB: [id number]/ [id number]

## 2020-12-27 NOTE — Plan of Care (Signed)
  Problem: Education: Goal: Knowledge of disease or condition will improve Outcome: Progressing   Problem: Cardiac: Goal: Will achieve and/or maintain hemodynamic stability Outcome: Progressing   Problem: Clinical Measurements: Goal: Postoperative complications will be avoided or minimized Outcome: Progressing   Problem: Respiratory: Goal: Respiratory status will improve Outcome: Progressing   Problem: Urinary Elimination: Goal: Ability to achieve and maintain adequate renal perfusion and functioning will improve Outcome: Progressing

## 2020-12-27 NOTE — Hospital Course (Addendum)
HPI: Mr. Benjamin [name] sent for consultation for three-vessel coronary disease.   Benjamin [name] is a 75 year old man with a longstanding history of coronary disease.  He had an MI in 1991 and had a stent placed in his proximal LAD.  Later he was found to have a total occlusion of his right coronary artery with left-to-right collaterals.  He also has a past medical history significant for type 2 diabetes without complication, hyperlipidemia, and tobacco abuse (60-pack-year, quit 2 months ago).   A couple of months ago he started having chest pain.  This was primarily exertional.  It began as a burning sensation substernally and radiated to his left neck and left arm.  The pain typically lasted about 5 to 10 minutes.  He did have one episode associated with diaphoresis.  He saw Dr. Martinique.  He was on aspirin, atenolol, and isosorbide.  He was started on Ranexa.  He saw Dr. Martinique again in mid March with 2-week history of worsening chest pain.  The pain was similar in nature but was occurring more frequently and lasting longer.  He also has had a couple of episodes at rest.  Dr. Martinique recommended cardiac catheterization.   Cardiac catheterization on 12/17/2020 revealed severe three-vessel disease with a chronic total obstruction of the right, an 80% ostial LAD lesion and 75% proximal to mid lesion, and a 90% ostial circumflex lesion.  Ejection fraction was preserved at 55 to 65%.  After reviewing the patient and all relevant studies the patient was determined to be a adequate candidate to proceed with coronary artery surgical revascularization.  He was admitted this hospitalization for the procedure.    Hospital course:  Patient was admitted and on 12/26/2020 he was taken the operating room where he underwent CABG x3.  He tolerated procedure well was taken to the surgical intensive care unit in stable condition.  Postoperative hospital course:  The patient was extubated using standard protocols without  significant difficulty.  He remained hemodynamically stable and Swan and arterial line were removed on postop day 1.  He is noted to be in a sinus bradycardia with a rate in the 40s and it is being temporarily paced with his epicardial wires.  His renal function is noted to be normal.  He is noted to have a mild expected acute blood loss anemia which is being monitored clinically.  Chest tubes were removed on postop day #1.  He did have some early postoperative nausea and was giving a short course of Reglan.  Blood sugars have been under adequate control using standard protocols.  We are holding off on resuming preoperative oral diabetic medications until taking diet adequately.  He developed signs of dysphagia.  SLP evaluation was performed and the patient exhibited evidence of aspiration risk. On postoperative day #2 he had no focal neurological deficit however was experiencing intermittent confusion.  He has remained in normal sinus rhythm.  Renal function remains within normal limits he does have a mild expected acute blood loss anemia but values are noted to be stable and we are monitoring clinically.  He did have some postoperative leukocytosis consistent with postponement systemic inflammatory response.  It is showing steady improvement over time.  He is not having fevers or showing evidence of infection.  On postoperative day #3 he did go into a atrial fibrillation rhythm with a rapid ventricular response and was started on amiodarone protocol.  Additionally he was started on oral beta-blocker.  The patient was maintaining NSR and felt stable  for transfer to the progressive care unit on 12/29/2020.  The patient continued to work with SLP.  He has been advanced to a regular diet.  The patient complained of shortness of breath while laying flat and trying to sleep.  CXR was obtained and showed ***.  The patients mobility was limited.  PT/OT consults were obtained and they recommended ***.

## 2020-12-27 NOTE — Plan of Care (Signed)

## 2020-12-27 NOTE — Progress Notes (Signed)
1 Day Post-Op Procedure(s) (LRB): CORONARY ARTERY BYPASS GRAFTING (CABG), ON PUMP, TIMES THREE, USING LEFT INTERNAL MAMMARY ARTERY AND ENDOSCOPICALLY HARVESTED RIGHT GREATER SAPHENOUS VEIN (N/A) TRANSESOPHAGEAL ECHOCARDIOGRAM (TEE) (N/A) Subjective: C/o incisional pain and nausea Noted to cough with thin liquids  Objective: Vital signs in last 24 hours: Temp:  [95.72 F (35.4 C)-97.88 F (36.6 C)] 97.88 F (36.6 C) (04/01 0700) Pulse Rate:  [30-136] 80 (04/01 0700) Cardiac Rhythm: A-V Sequential paced (04/01 0400) Resp:  [12-27] 21 (04/01 0700) BP: (96-131)/(44-93) 113/72 (04/01 0700) SpO2:  [93 %-100 %] 96 % (04/01 0700) Arterial Line BP: (98-155)/(48-76) 113/57 (04/01 0700) FiO2 (%):  [40 %-50 %] 40 % (03/31 1615) Weight:  [65.5 kg] 65.5 kg (04/01 0615)  Hemodynamic parameters for last 24 hours: PAP: (13-28)/(9-18) 22/15 CO:  [3.5 L/min-5.2 L/min] 3.7 L/min CI:  [2.1 L/min/m2-3.1 L/min/m2] 2.2 L/min/m2  Intake/Output from previous day: 03/31 0701 - 04/01 0700 In: 4941.2 [I.V.:3342.1; IV Piggyback:1599.2] Out: 1638 [Urine:4640; Blood:440; Chest Tube:390] Intake/Output this shift: No intake/output data recorded.  General appearance: alert, cooperative and no distress Neurologic: intact Heart: regular rate and rhythm Lungs: diminished breath sounds bibasilar Abdomen: soft, nontender, hypoactive BS  Lab Results: Recent Labs    12/26/20 1900 12/27/20 0310  WBC 13.5* 9.6  HGB 9.6* 9.4*  HCT 29.2* 28.0*  PLT 203 186   BMET:  Recent Labs    12/26/20 1900 12/27/20 0310  NA 134* 132*  K 4.2 4.4  CL 105 104  CO2 23 23  GLUCOSE 131* 142*  BUN 11 9  CREATININE 0.67 0.53*  CALCIUM 7.5* 7.5*    PT/INR:  Recent Labs    12/26/20 1326  LABPROT 18.1*  INR 1.6*   ABG    Component Value Date/Time   PHART 7.378 12/26/2020 1654   HCO3 21.9 12/26/2020 1654   TCO2 23 12/26/2020 1654   ACIDBASEDEF 3.0 (H) 12/26/2020 1654   O2SAT 99.0 12/26/2020 1654   CBG (last  3)  Recent Labs    12/27/20 0313 12/27/20 0513 12/27/20 0701  GLUCAP 137* 139* 158*    Assessment/Plan: S/P Procedure(s) (LRB): CORONARY ARTERY BYPASS GRAFTING (CABG), ON PUMP, TIMES THREE, USING LEFT INTERNAL MAMMARY ARTERY AND ENDOSCOPICALLY HARVESTED RIGHT GREATER SAPHENOUS VEIN (N/A) TRANSESOPHAGEAL ECHOCARDIOGRAM (TEE) (N/A) -  CV- hemodynamics stable- DC Swan and A line  In sinus brady in 40s- continue pacing, no beta blocker for now  ASA, statin RESP- IS for atelectasis RENAL- Creatinine and lytes OK ENDO- CBG controlled on insulin drip  Transition to levemir + SSI  Hold off on oral meds until taking PO GI- nausea- odansetron PRN, will give Reglan for 48 hours Swallowing- possible aspiration of thin liquids- SLP evaluation HEME- anemia secondary to ABL- mild, follow SCD + enoxaparin for DVT prophylaxis Dc chest tubes Ambulate/ Cardiac rehab    LOS: 1 day    Melrose Nakayama 12/27/2020

## 2020-12-27 NOTE — Progress Notes (Signed)
      [address]       [place] [address]             [phone]      POD # 1 CABG  Still c/o soreness  Noted to be aspirating so still NPO for now  BP 136/77   Pulse 73   Temp 98.4 F (36.9 C) (Oral)   Resp (!) 23   Ht 5' 4.5" (1.638 m)   Wt 65.5 kg   SpO2 94%   BMI 24.40 kg/m   Intake/Output Summary (Last 24 hours) at 12/27/2020 1941 Last data filed at 12/27/2020 1500 Gross per 24 hour  Intake 1526.5 ml  Output 1760 ml  Net -233.5 ml   K= 4.2, Hct= 31  Ding well overall Will reassess PO intake tomorrow  Remo Lipps C. Roxan Hockey, MD [facility] [phone]

## 2020-12-27 NOTE — Evaluation (Signed)
Clinical/Bedside Swallow Evaluation Patient Details  Name: Benjamin [name] MRN: [id number] Date of Birth: 09-14-1946  Today's Date: 12/27/2020 Time: SLP Start Time (ACUTE ONLY): 0932 SLP Stop Time (ACUTE ONLY): 0946 SLP Time Calculation (min) (ACUTE ONLY): 14 min  Past Medical History:  Past Medical History:  Diagnosis Date  . Asthma    as a child, no problems as an adult, no inhaler  . Coronary artery disease    Prior PCI to the prox LAD in 1991; S/P PCI to LCX in October 1991  . Diabetes mellitus    TYPE 2  . GERD (gastroesophageal reflux disease)    occasional - diet controlled  . History of kidney stones    many yrs ago  . Hyperlipidemia   . Tobacco abuse    quit 09/2020 - smoked for 64 yrs   Past Surgical History:  Past Surgical History:  Procedure Laterality Date  . COLONOSCOPY WITH PROPOFOL N/A 04/21/2016   Procedure: COLONOSCOPY WITH PROPOFOL;  Surgeon: Garlan Fair, MD;  Location: [facility];  Service: Endoscopy;  Laterality: N/A;  . CORONARY ANGIOPLASTY  05/1990   PROXIMAL LAD  . CORONARY ANGIOPLASTY  06/1990   LEFT CIRCUMFLEX CORONARY  . KIDNEY STONE SURGERY Bilateral    x 2 surgeries  . LEFT HEART CATH AND CORONARY ANGIOGRAPHY N/A 11/18/2018   Procedure: LEFT HEART CATH AND CORONARY ANGIOGRAPHY;  Surgeon: Martinique, Peter M, MD;  Location: [facility];  Service: Cardiovascular;  Laterality: N/A;  . LEFT HEART CATH AND CORONARY ANGIOGRAPHY N/A 12/17/2020   Procedure: LEFT HEART CATH AND CORONARY ANGIOGRAPHY;  Surgeon: Martinique, Peter M, MD;  Location: [facility];  Service: Cardiovascular;  Laterality: N/A;  . UMBILICAL HERNIA REPAIR     HPI:  Pt is a 75 yo male presenting for CABG x3 on 3/31, intubated for procedure only. Later that day, pt was noted to be coughing with thin liquids with RN and he reported trouble swallowing pills PTA. Pre-op CXR revealed LLL consolidation, felt to be atelectasis on additional imaging. PMH includes: CAD, tobacco abuse,  HLD, GERD, DM, asthma   Assessment / Plan / Recommendation Clinical Impression  Pt presents with signs of an acute, likely reversible dysphagia secondary to intubation and overall deconditioning. Pt is a little disoriented this morning and has c/o of a sore throat. Vocal quality is mildly rough, and the L side of his lip is edematous. He can achieve good seal, but this limits his ROM. Throat clearing is noted often with ice chips and small amounts of water, and multiple swallows are noted regardless of consistency. With purees, he reports feeling like they don't go down all the way, but he does not exhibit overt signs of aspiration. Recommend that he remain NPO for today except for ice chips after oral care and meds crushed in small amounts of puree. Anticipate that prognosis would be good for return to PO diet given brevity of intubation. SLP Visit Diagnosis: Dysphagia, unspecified (R13.10)    Aspiration Risk  Mild aspiration risk;Moderate aspiration risk    Diet Recommendation NPO except meds;Ice chips PRN after oral care   Medication Administration: Crushed with puree    Other  Recommendations Oral Care Recommendations: Oral care QID;Oral care prior to ice chip/H20 Other Recommendations: Have oral suction available   Follow up Recommendations  (tba)      Frequency and Duration min 2x/week  2 weeks       Prognosis Prognosis for Safe Diet Advancement: Good  Swallow Study   General HPI: Pt is a 75 yo male presenting for CABG x3 on 3/31, intubated for procedure only. Later that day, pt was noted to be coughing with thin liquids with RN and he reported trouble swallowing pills PTA. Pre-op CXR revealed LLL consolidation, felt to be atelectasis on additional imaging. PMH includes: CAD, tobacco abuse, HLD, GERD, DM, asthma Type of Study: Bedside Swallow Evaluation Previous Swallow Assessment: none in chart Diet Prior to this Study: NPO Temperature Spikes Noted: No Respiratory  Status: Nasal cannula History of Recent Intubation: Yes Length of Intubations (days):  (<1 for procedure) Date extubated: 12/26/20 Behavior/Cognition: Alert;Cooperative;Confused;Pleasant mood Oral Cavity Assessment: Within Functional Limits Oral Care Completed by SLP: No Oral Cavity - Dentition: Missing dentition;Poor condition Vision: Functional for self-feeding Self-Feeding Abilities: Able to feed self Patient Positioning: Upright in bed Baseline Vocal Quality: Other (comment) (mildly rough) Volitional Swallow: Able to elicit    Oral/Motor/Sensory Function Overall Oral Motor/Sensory Function: Mild impairment Facial ROM: Reduced left;Other (Comment) (? due to edema) Facial Symmetry: Abnormal symmetry left;Other (Comment) (edema) Facial Strength: Within Functional Limits Lingual ROM: Within Functional Limits Lingual Symmetry: Within Functional Limits Lingual Strength: Within Functional Limits Velum: Within Functional Limits Mandible: Within Functional Limits   Ice Chips Ice chips: Impaired Presentation: Spoon;Self Fed Pharyngeal Phase Impairments: Multiple swallows;Throat Clearing - Immediate   Thin Liquid Thin Liquid: Impaired Presentation: Spoon;Straw Pharyngeal  Phase Impairments: Multiple swallows;Throat Clearing - Immediate    Nectar Thick Nectar Thick Liquid: Not tested   Honey Thick Honey Thick Liquid: Not tested   Puree Puree: Impaired Presentation: Spoon Pharyngeal Phase Impairments: Multiple swallows   Solid     Solid: Not tested      Osie Bond., M.A. [place] Pager [phone] Office [phone]  12/27/2020,11:26 AM

## 2020-12-28 ENCOUNTER — Inpatient Hospital Stay (HOSPITAL_COMMUNITY): Payer: Medicare Other

## 2020-12-28 LAB — BASIC METABOLIC PANEL
Anion gap: 6 (ref 5–15)
BUN: 8 mg/dL (ref 8–23)
CO2: 23 mmol/L (ref 22–32)
Calcium: 8.4 mg/dL — ABNORMAL LOW (ref 8.9–10.3)
Chloride: 104 mmol/L (ref 98–111)
Creatinine, Ser: 0.62 mg/dL (ref 0.61–1.24)
GFR, Estimated: >60 mL/min (ref >60)
Glucose, Bld: 122 mg/dL — ABNORMAL HIGH (ref 70–99)
Potassium: 4 mmol/L (ref 3.5–5.1)
Sodium: 133 mmol/L — ABNORMAL LOW (ref 135–145)

## 2020-12-28 LAB — CBC
HCT: 32.3 % — ABNORMAL LOW (ref 39.0–52.0)
Hemoglobin: 10.6 g/dL — ABNORMAL LOW (ref 13.0–17.0)
MCH: 27.7 pg (ref 26.0–34.0)
MCHC: 32.8 g/dL (ref 30.0–36.0)
MCV: 84.3 fL (ref 80.0–100.0)
Platelets: 224 K/uL (ref 150–400)
RBC: 3.83 MIL/uL — ABNORMAL LOW (ref 4.22–5.81)
RDW: 17.2 % — ABNORMAL HIGH (ref 11.5–15.5)
WBC: 14.2 K/uL — ABNORMAL HIGH (ref 4.0–10.5)
nRBC: 0 % (ref 0.0–0.2)

## 2020-12-28 LAB — GLUCOSE, CAPILLARY
Glucose-Capillary: 133 mg/dL — ABNORMAL HIGH (ref 70–99)
Glucose-Capillary: 136 mg/dL — ABNORMAL HIGH (ref 70–99)
Glucose-Capillary: 145 mg/dL — ABNORMAL HIGH (ref 70–99)
Glucose-Capillary: 167 mg/dL — ABNORMAL HIGH (ref 70–99)
Glucose-Capillary: 182 mg/dL — ABNORMAL HIGH (ref 70–99)
Glucose-Capillary: 189 mg/dL — ABNORMAL HIGH (ref 70–99)

## 2020-12-28 MED ORDER — INSULIN ASPART 100 UNIT/ML ~~LOC~~ SOLN
0.0000 [IU] | Freq: Three times a day (TID) | SUBCUTANEOUS | Status: DC
  Administered 2020-12-28 – 2020-12-29 (×5): 4 [IU] via SUBCUTANEOUS
  Administered 2020-12-30 (×2): 2 [IU] via SUBCUTANEOUS

## 2020-12-28 MED ORDER — AMIODARONE HCL IN DEXTROSE 360-4.14 MG/200ML-% IV SOLN
30.0000 mg/h | INTRAVENOUS | Status: AC
  Administered 2020-12-29 – 2020-12-30 (×3): 30 mg/h via INTRAVENOUS
  Filled 2020-12-28 (×3): qty 200

## 2020-12-28 MED ORDER — AMIODARONE HCL IN DEXTROSE 360-4.14 MG/200ML-% IV SOLN
60.0000 mg/h | INTRAVENOUS | Status: AC
  Administered 2020-12-28 (×2): 60 mg/h via INTRAVENOUS
  Filled 2020-12-28: qty 200

## 2020-12-28 MED ORDER — AMIODARONE LOAD VIA INFUSION
150.0000 mg | Freq: Once | INTRAVENOUS | Status: AC
  Filled 2020-12-28: qty 83.34

## 2020-12-28 MED ORDER — AMIODARONE HCL IN DEXTROSE 360-4.14 MG/200ML-% IV SOLN
INTRAVENOUS | Status: AC
  Administered 2020-12-28: 150 mg via INTRAVENOUS
  Filled 2020-12-28: qty 200

## 2020-12-28 MED ORDER — INSULIN ASPART 100 UNIT/ML ~~LOC~~ SOLN: 0.0000 [IU] | SUBCUTANEOUS | Status: DC

## 2020-12-28 NOTE — Progress Notes (Signed)
  Speech Language Pathology Treatment: Dysphagia  Patient Details Name: Benjamin [name] MRN: [id number] DOB: May 20, 1946 Today's Date: 12/28/2020 Time: 5537-4827 SLP Time Calculation (min) (ACUTE ONLY): 18 min  Assessment / Plan / Recommendation Clinical Impression  Pt seen for swallow re-assessment/PO readiness to address odynophagia/lingual edema mentioned on BSE initially and determine aspiration risk/diet recommendation.  Pt with improved overall odynophagia with pt stating "my throat isn't hurting this morning", but continued lingual edema (left greater than right) was evident on upper lip.  This did not appear to impact lip closure with smaller sips encouraged via min verbal cueing and/or with intake of puree/solids.  No oral (labial) residue noted after intake.  Pt with one incidence of delayed cough with thin via cup with initial swallow, but subsequent swallows appeared [facility] with swallowing strategies of small, slow sips given to pt with good compliance during re-assessment.  Mastication appeared adequate for solids and puree without overt s/s of aspiration noted during consumption.  Recommended Regular/thin liquid diet with swallowing strategies given and soft foods encouraged prn. ST will f/u for diet tolerance/aspiration/swallowing precautions in place while pt in acute setting.    HPI HPI: Pt is a 75 yo male presenting for CABG x3 on 3/31, intubated for procedure only. Later on 12/27/20, pt was noted to be coughing with thin liquids with RN and he reported trouble swallowing pills PTA. Pre-op CXR revealed LLL consolidation, felt to be atelectasis on additional imaging. PMH includes: CAD, tobacco abuse, HLD, GERD, DM, asthma; BSE completed on 12/27/20 with NPO status recommended d/t aspiration risk/edema/odynophagia.      SLP Plan  Goals updated       Recommendations  Diet recommendations: Regular;Thin liquid Liquids provided via: Cup Medication Administration: Whole meds with  liquid Supervision: Patient able to self feed;Intermittent supervision to cue for compensatory strategies Compensations: Small sips/bites;Slow rate                Oral Care Recommendations: Oral care BID Follow up Recommendations: Other (comment) (TBD) SLP Visit Diagnosis: Dysphagia, unspecified (R13.10) Plan: Goals updated                       Elvina Sidle, M.S., CCC-SLP 12/28/2020, 12:21 PM

## 2020-12-28 NOTE — Progress Notes (Signed)
      [address]       [place] [address]             [phone]      Passed swallow, was able to eat without issues  BP (!) 149/73   Pulse 88   Temp 97.7 F (36.5 C) (Oral)   Resp (!) 36   Ht 5' 4.5" (1.638 m)   Wt 63.1 kg   SpO2 94%   BMI 23.51 kg/m   Intake/Output Summary (Last 24 hours) at 12/28/2020 1723 Last data filed at 12/28/2020 1600 Gross per 24 hour  Intake 538.28 ml  Output 1830 ml  Net -1291.72 ml   Still with mild intermittent confusion  Remo Lipps C. Roxan Hockey, MD [facility] [phone]

## 2020-12-28 NOTE — Plan of Care (Signed)

## 2020-12-28 NOTE — Progress Notes (Signed)
2 Days Post-Op Procedure(s) (LRB): CORONARY ARTERY BYPASS GRAFTING (CABG), ON PUMP, TIMES THREE, USING LEFT INTERNAL MAMMARY ARTERY AND ENDOSCOPICALLY HARVESTED RIGHT GREATER SAPHENOUS VEIN (N/A) TRANSESOPHAGEAL ECHOCARDIOGRAM (TEE) (N/A) Subjective: "Sore as a bull" Intermittent confusion per RN Objective: Vital signs in last 24 hours: Temp:  [98.2 F (36.8 C)-98.7 F (37.1 C)] 98.7 F (37.1 C) (04/02 0814) Pulse Rate:  [69-82] 78 (04/02 0730) Cardiac Rhythm: Normal sinus rhythm (04/02 0000) Resp:  [16-33] 21 (04/02 0730) BP: (117-159)/(60-85) 128/77 (04/02 0730) SpO2:  [89 %-99 %] 94 % (04/02 0730) Arterial Line BP: (107-160)/(32-65) 132/32 (04/02 0210) Weight:  [63.1 kg] 63.1 kg (04/02 0408)  Hemodynamic parameters for last 24 hours:    Intake/Output from previous day: 04/01 0701 - 04/02 0700 In: 664.7 [P.O.:250; I.V.:214.7; IV Piggyback:200] Out: 1240 [Urine:1240] Intake/Output this shift: No intake/output data recorded.  General appearance: alert, cooperative and no distress Neurologic: intact Heart: regular rate and rhythm Lungs: diminished breath sounds bibasilar Abdomen: normal findings: soft, non-tender  Lab Results: Recent Labs    12/27/20 1700 12/28/20 0342  WBC 12.2* 14.2*  HGB 10.3* 10.6*  HCT 31.5* 32.3*  PLT 217 224   BMET:  Recent Labs    12/27/20 1700 12/28/20 0342  NA 132* 133*  K 4.2 4.0  CL 104 104  CO2 19* 23  GLUCOSE 168* 122*  BUN 9 8  CREATININE 0.82 0.62  CALCIUM 8.2* 8.4*    PT/INR:  Recent Labs    12/26/20 1326  LABPROT 18.1*  INR 1.6*   ABG    Component Value Date/Time   PHART 7.375 12/26/2020 1811   HCO3 22.8 12/26/2020 1811   TCO2 24 12/26/2020 1811   ACIDBASEDEF 3.0 (H) 12/26/2020 1811   O2SAT 99.0 12/26/2020 1811   CBG (last 3)  Recent Labs    12/28/20 0042 12/28/20 0300 12/28/20 0816  GLUCAP 136* 133* 145*    Assessment/Plan: S/P Procedure(s) (LRB): CORONARY ARTERY BYPASS GRAFTING (CABG), ON PUMP,  TIMES THREE, USING LEFT INTERNAL MAMMARY ARTERY AND ENDOSCOPICALLY HARVESTED RIGHT GREATER SAPHENOUS VEIN (N/A) TRANSESOPHAGEAL ECHOCARDIOGRAM (TEE) (N/A) -NEURO- no focal deficit, intermittent confusion but appropriate currently CV- stable in SR  ASA, Beta blocker, statin, restart ACE-I prior to DC RESP- IS for atelectasis RENAL- creatinine and lytes OK ENDO- CBG better controlled Aspiration- NPO for now  Reassess swallowing today, hopefully can start PO SCD + enoxaparin for DVT prophylaxis Dc central line and Foley   LOS: 2 days    Benjamin [name] 12/28/2020

## 2020-12-29 LAB — CBC
HCT: 34 % — ABNORMAL LOW (ref 39.0–52.0)
Hemoglobin: 10.9 g/dL — ABNORMAL LOW (ref 13.0–17.0)
MCH: 26.9 pg (ref 26.0–34.0)
MCHC: 32.1 g/dL (ref 30.0–36.0)
MCV: 84 fL (ref 80.0–100.0)
Platelets: 207 10*3/uL (ref 150–400)
RBC: 4.05 MIL/uL — ABNORMAL LOW (ref 4.22–5.81)
RDW: 17.2 % — ABNORMAL HIGH (ref 11.5–15.5)
WBC: 13.6 10*3/uL — ABNORMAL HIGH (ref 4.0–10.5)
nRBC: 0 % (ref 0.0–0.2)

## 2020-12-29 LAB — BASIC METABOLIC PANEL
Anion gap: 5 (ref 5–15)
BUN: 8 mg/dL (ref 8–23)
CO2: 26 mmol/L (ref 22–32)
Calcium: 8.7 mg/dL — ABNORMAL LOW (ref 8.9–10.3)
Chloride: 107 mmol/L (ref 98–111)
Creatinine, Ser: 0.6 mg/dL — ABNORMAL LOW (ref 0.61–1.24)
GFR, Estimated: >60 mL/min (ref >60)
Glucose, Bld: 117 mg/dL — ABNORMAL HIGH (ref 70–99)
Potassium: 3.8 mmol/L (ref 3.5–5.1)
Sodium: 138 mmol/L (ref 135–145)

## 2020-12-29 LAB — GLUCOSE, CAPILLARY
Glucose-Capillary: 174 mg/dL — ABNORMAL HIGH (ref 70–99)
Glucose-Capillary: 182 mg/dL — ABNORMAL HIGH (ref 70–99)
Glucose-Capillary: 91 mg/dL (ref 70–99)
Glucose-Capillary: 182 mg/dL — ABNORMAL HIGH (ref 70–99)

## 2020-12-29 MED ORDER — GLIMEPIRIDE 4 MG PO TABS: 4.0000 mg | ORAL_TABLET | Freq: Every day | ORAL | Status: DC

## 2020-12-29 MED ORDER — METFORMIN HCL 500 MG PO TABS
1000.0000 mg | ORAL_TABLET | Freq: Two times a day (BID) | ORAL | Status: DC
  Administered 2020-12-29 – 2021-01-02 (×8): 1000 mg via ORAL
  Filled 2020-12-29 (×8): qty 2

## 2020-12-29 MED ORDER — POTASSIUM CHLORIDE CRYS ER 20 MEQ PO TBCR
40.0000 meq | EXTENDED_RELEASE_TABLET | Freq: Two times a day (BID) | ORAL | Status: AC
  Administered 2020-12-29 (×2): 40 meq via ORAL
  Filled 2020-12-29 (×2): qty 2

## 2020-12-29 MED ORDER — METOPROLOL TARTRATE 25 MG PO TABS
25.0000 mg | ORAL_TABLET | Freq: Two times a day (BID) | ORAL | Status: DC
  Administered 2020-12-29 (×2): 25 mg via ORAL
  Filled 2020-12-29 (×2): qty 1

## 2020-12-29 MED ORDER — LINAGLIPTIN 5 MG PO TABS
5.0000 mg | ORAL_TABLET | Freq: Every day | ORAL | Status: DC
  Administered 2020-12-29 – 2021-01-02 (×5): 5 mg via ORAL
  Filled 2020-12-29 (×5): qty 1

## 2020-12-29 MED ORDER — GUAIFENESIN-DM 100-10 MG/5ML PO SYRP
5.0000 mL | ORAL_SOLUTION | ORAL | Status: DC | PRN
  Administered 2020-12-29 – 2020-12-30 (×3): 5 mL via ORAL
  Filled 2020-12-29 (×3): qty 5

## 2020-12-29 MED ORDER — AMIODARONE IV BOLUS ONLY 150 MG/100ML
150.0000 mg | Freq: Once | INTRAVENOUS | Status: AC
  Administered 2020-12-29: 150 mg via INTRAVENOUS
  Filled 2020-12-29: qty 100

## 2020-12-29 MED FILL — Lidocaine HCl Local Soln Prefilled Syringe 100 MG/5ML (2%): INTRAMUSCULAR | Qty: 5 | Status: AC

## 2020-12-29 MED FILL — Sodium Chloride IV Soln 0.9%: INTRAVENOUS | Qty: 3000 | Status: AC

## 2020-12-29 MED FILL — Magnesium Sulfate Inj 50%: INTRAMUSCULAR | Qty: 10 | Status: AC

## 2020-12-29 MED FILL — Electrolyte-R (PH 7.4) Solution: INTRAVENOUS | Qty: 4000 | Status: AC

## 2020-12-29 MED FILL — Heparin Sodium (Porcine) Inj 1000 Unit/ML: INTRAMUSCULAR | Qty: 30 | Status: AC

## 2020-12-29 MED FILL — Heparin Sodium (Porcine) Inj 1000 Unit/ML: INTRAMUSCULAR | Qty: 2500 | Status: AC

## 2020-12-29 MED FILL — Heparin Sodium (Porcine) Inj 1000 Unit/ML: INTRAMUSCULAR | Qty: 20 | Status: AC

## 2020-12-29 MED FILL — Mannitol IV Soln 20%: INTRAVENOUS | Qty: 500 | Status: AC

## 2020-12-29 MED FILL — Potassium Chloride Inj 2 mEq/ML: INTRAVENOUS | Qty: 40 | Status: AC

## 2020-12-29 NOTE — Progress Notes (Signed)
      [address]       [place] [address]             [phone]      POD # 3 No complaints this evening Converted to SR with amiodarone + metoprolol  BP 102/61   Pulse 72   Temp 98.4 F (36.9 C) (Oral)   Resp 20   Ht 5' 4.5" (1.638 m)   Wt 60.9 kg   SpO2 92%   BMI 22.69 kg/m   Intake/Output Summary (Last 24 hours) at 12/29/2020 1841 Last data filed at 12/29/2020 1630 Gross per 24 hour  Intake 1099.64 ml  Output 1450 ml  Net -350.36 ml   Remo Lipps C. Roxan Hockey, MD [facility] [phone]

## 2020-12-29 NOTE — Plan of Care (Signed)
  Problem: Activity: Goal: Risk for activity intolerance will decrease Outcome: Progressing    Problem: Cardiac: Goal: Will achieve and/or maintain hemodynamic stability Outcome: Progressing   Problem: Clinical Measurements: Goal: Postoperative complications will be avoided or minimized Outcome: Progressing   Problem: Respiratory: Goal: Respiratory status will improve Outcome: Progressing     

## 2020-12-29 NOTE — Progress Notes (Signed)
3 Days Post-Op Procedure(s) (LRB): CORONARY ARTERY BYPASS GRAFTING (CABG), ON PUMP, TIMES THREE, USING LEFT INTERNAL MAMMARY ARTERY AND ENDOSCOPICALLY HARVESTED RIGHT GREATER SAPHENOUS VEIN (N/A) TRANSESOPHAGEAL ECHOCARDIOGRAM (TEE) (N/A) Subjective: Up in chair  Objective: Vital signs in last 24 hours: Temp:  [97.7 F (36.5 C)-99.2 F (37.3 C)] 98.6 F (37 C) (04/03 0725) Pulse Rate:  [71-152] 71 (04/03 0700) Cardiac Rhythm: Atrial fibrillation (04/03 0330) Resp:  [3-36] 25 (04/03 0700) BP: (95-159)/(64-94) 110/79 (04/03 0700) SpO2:  [90 %-100 %] 92 % (04/03 0700) Weight:  [60.9 kg] 60.9 kg (04/03 0600)  Hemodynamic parameters for last 24 hours:    Intake/Output from previous day: 04/02 0701 - 04/03 0700 In: 694.1 [P.O.:240; I.V.:354; IV Piggyback:100.1] Out: 2440 [Urine:2440] Intake/Output this shift: No intake/output data recorded.  General appearance: alert, cooperative and no distress Neurologic: intact Heart: irregularly irregular rhythm Lungs: diminished breath sounds bibasilar Wound: clean and dryt  Lab Results: Recent Labs    12/28/20 0342 12/29/20 0218  WBC 14.2* 13.6*  HGB 10.6* 10.9*  HCT 32.3* 34.0*  PLT 224 207   BMET:  Recent Labs    12/28/20 0342 12/29/20 0218  NA 133* 138  K 4.0 3.8  CL 104 107  CO2 23 26  GLUCOSE 122* 117*  BUN 8 8  CREATININE 0.62 0.60*  CALCIUM 8.4* 8.7*    PT/INR:  Recent Labs    12/26/20 1326  LABPROT 18.1*  INR 1.6*   ABG    Component Value Date/Time   PHART 7.375 12/26/2020 1811   HCO3 22.8 12/26/2020 1811   TCO2 24 12/26/2020 1811   ACIDBASEDEF 3.0 (H) 12/26/2020 1811   O2SAT 99.0 12/26/2020 1811   CBG (last 3)  Recent Labs    12/28/20 1527 12/28/20 2102 12/29/20 0644  GLUCAP 167* 189* 174*    Assessment/Plan: S/P Procedure(s) (LRB): CORONARY ARTERY BYPASS GRAFTING (CABG), ON PUMP, TIMES THREE, USING LEFT INTERNAL MAMMARY ARTERY AND ENDOSCOPICALLY HARVESTED RIGHT GREATER SAPHENOUS VEIN  (N/A) TRANSESOPHAGEAL ECHOCARDIOGRAM (TEE) (N/A) -Went into A fib with RVR yesterday evening, started on amiodarone  HR in 140s this AM- rebolus amiodarone, start PO metoprolol Continue IS Mobilize as tolerated SCD + enoxaparin Tolerating PO CBG elevated, restart PO meds    LOS: 3 days    Benjamin [name] 12/29/2020

## 2020-12-30 ENCOUNTER — Inpatient Hospital Stay (HOSPITAL_COMMUNITY): Payer: Medicare Other

## 2020-12-30 LAB — BASIC METABOLIC PANEL
Anion gap: 7 (ref 5–15)
BUN: 13 mg/dL (ref 8–23)
CO2: 27 mmol/L (ref 22–32)
Calcium: 8.7 mg/dL — ABNORMAL LOW (ref 8.9–10.3)
Chloride: 106 mmol/L (ref 98–111)
Creatinine, Ser: 0.7 mg/dL (ref 0.61–1.24)
GFR, Estimated: >60 mL/min (ref >60)
Glucose, Bld: 55 mg/dL — ABNORMAL LOW (ref 70–99)
Potassium: 4.1 mmol/L (ref 3.5–5.1)
Sodium: 140 mmol/L (ref 135–145)

## 2020-12-30 LAB — BPAM RBC
Blood Product Expiration Date: 202204252359
Blood Product Expiration Date: 202204252359
ISSUE DATE / TIME: 202203310838
ISSUE DATE / TIME: 202203310838
Unit Type and Rh: 6200
Unit Type and Rh: 6200

## 2020-12-30 LAB — CBC
HCT: 33.4 % — ABNORMAL LOW (ref 39.0–52.0)
Hemoglobin: 10.8 g/dL — ABNORMAL LOW (ref 13.0–17.0)
MCH: 27.1 pg (ref 26.0–34.0)
MCHC: 32.3 g/dL (ref 30.0–36.0)
MCV: 83.9 fL (ref 80.0–100.0)
Platelets: 263 10*3/uL (ref 150–400)
RBC: 3.98 MIL/uL — ABNORMAL LOW (ref 4.22–5.81)
RDW: 17.3 % — ABNORMAL HIGH (ref 11.5–15.5)
WBC: 14.4 10*3/uL — ABNORMAL HIGH (ref 4.0–10.5)
nRBC: 0 % (ref 0.0–0.2)

## 2020-12-30 LAB — TYPE AND SCREEN
ABO/RH(D): A POS
Antibody Screen: NEGATIVE
Unit division: 0
Unit division: 0

## 2020-12-30 LAB — GLUCOSE, CAPILLARY
Glucose-Capillary: 65 mg/dL — ABNORMAL LOW (ref 70–99)
Glucose-Capillary: 94 mg/dL (ref 70–99)
Glucose-Capillary: 146 mg/dL — ABNORMAL HIGH (ref 70–99)
Glucose-Capillary: 133 mg/dL — ABNORMAL HIGH (ref 70–99)
Glucose-Capillary: 166 mg/dL — ABNORMAL HIGH (ref 70–99)

## 2020-12-30 MED ORDER — SODIUM CHLORIDE 0.9% FLUSH: 3.0000 mL | INTRAVENOUS | Status: DC | PRN

## 2020-12-30 MED ORDER — MAGNESIUM HYDROXIDE 400 MG/5ML PO SUSP: 30.0000 mL | Freq: Every day | ORAL | Status: DC | PRN

## 2020-12-30 MED ORDER — SODIUM CHLORIDE 0.9% FLUSH
3.0000 mL | Freq: Two times a day (BID) | INTRAVENOUS | Status: DC
  Administered 2020-12-31 – 2021-01-02 (×4): 3 mL via INTRAVENOUS

## 2020-12-30 MED ORDER — ALUM & MAG HYDROXIDE-SIMETH 200-200-20 MG/5ML PO SUSP: 15.0000 mL | Freq: Four times a day (QID) | ORAL | Status: DC | PRN

## 2020-12-30 MED ORDER — AMIODARONE HCL 200 MG PO TABS
400.0000 mg | ORAL_TABLET | Freq: Two times a day (BID) | ORAL | Status: DC
  Administered 2020-12-30 – 2021-01-02 (×7): 400 mg via ORAL
  Filled 2020-12-30 (×7): qty 2

## 2020-12-30 MED ORDER — ATENOLOL 25 MG PO TABS
25.0000 mg | ORAL_TABLET | Freq: Every day | ORAL | Status: DC
  Administered 2020-12-30: 25 mg via ORAL
  Filled 2020-12-30: qty 1

## 2020-12-30 MED ORDER — SODIUM CHLORIDE 0.9 % IV SOLN: 250.0000 mL | INTRAVENOUS | Status: DC | PRN

## 2020-12-30 MED ORDER — ~~LOC~~ CARDIAC SURGERY, PATIENT & FAMILY EDUCATION: Freq: Once | Status: DC

## 2020-12-30 NOTE — Progress Notes (Signed)
Mobility Specialist - Progress Note   12/30/20 1638  Mobility  Activity Ambulated in hall  Level of Assistance Standby assist, set-up cues, supervision of patient - no hands on  Assistive Device Front wheel walker  Distance Ambulated (ft) 80 ft  Mobility Response Tolerated well  Mobility performed by Mobility specialist  $Mobility charge 1 Mobility   Pre-mobility: 52 HR, 73/46 then 99/57 BP Post-mobility: 63 HR, 106/65 BP  Pt c/o dizziness when standing from bed and had to sit down, BP's as above. Pt asx on 2nd trial of standing. Distance limited by general fatigue. Pt sitting on edge of bed after walk.   Pricilla Handler Mobility Specialist Mobility Specialist Phone: [phone]

## 2020-12-30 NOTE — Progress Notes (Signed)
  Speech Language Pathology Treatment: Dysphagia  Patient Details Name: Benjamin [name] MRN: [id number] DOB: 1946-09-24 Today's Date: 12/30/2020 Time: 7371-0626 SLP Time Calculation (min) (ACUTE ONLY): 13 min  Assessment / Plan / Recommendation Clinical Impression  Intermittent throat clearing was noted throughout all PO trials as well as coughing especially with thin liquids. However, his chest x-ray today showed no consolidation and his lung sounds have been clear and diminished since starting a diet. RN reports no difficulty except with large pill x1. Given these findings, recommend continuation of his current diet but SLP will continue to follow to further examine his oropharyngeal swallow. Recommend that he take sips from the cup to help control his rate and volume.    HPI HPI: Pt is a 75 yo male presenting for CABG x3 on 3/31, intubated for procedure only. Later that day, pt was noted to be coughing with thin liquids with RN and he reported trouble swallowing pills PTA. Pre-op CXR revealed LLL consolidation, felt to be atelectasis on additional imaging. PMH includes: CAD, tobacco abuse, HLD, GERD, DM, asthma; BSE completed on 12/27/20 with NPO status recommended d/t aspiration risk/edema/odynophagia.      SLP Plan  Continue with current plan of care       Recommendations  Diet recommendations: Regular;Thin liquid Liquids provided via: Cup Medication Administration: Whole meds with liquid (give with puree if larger) Supervision: Patient able to self feed;Intermittent supervision to cue for compensatory strategies Compensations: Small sips/bites;Slow rate Postural Changes and/or Swallow Maneuvers: Seated upright 90 degrees                Oral Care Recommendations: Oral care BID Follow up Recommendations:  (tba) SLP Visit Diagnosis: Dysphagia, unspecified (R13.10) Plan: Continue with current plan of care       GO                Jeanine Luz., SLP Student 12/30/2020, 10:50  AM

## 2020-12-30 NOTE — Progress Notes (Signed)
Inpatient Diabetes Program Recommendations  AACE/ADA: New Consensus Statement on Inpatient Glycemic Control (2015)  Target Ranges:  Prepandial:   less than 140 mg/dL      Peak postprandial:   less than 180 mg/dL (1-2 hours)      Critically ill patients:  140 - 180 mg/dL   Results for URIE, LOUGHNER (MRN [id number]) as of 12/30/2020 07:25  Ref. Range 12/30/2020 06:31  Glucose-Capillary Latest Ref Range: 70 - 99 mg/dL 65 (L)    Home DM Meds: Amaryl 4 mg Sharyn Lull       Farxiga 5 mg Daily       Metformin 1000 mg BID       Januvia 100 mg Daily  Current Orders: Levemir 20 units BID      Novolog 0-24 units ac/hs      Metformin 1000 mg BID       Tradjenta 5 mg Daily    MD- Note Mild Hypoglycemia this AM.  Pt got both doses of Levemir yesterday.  Please consider reducing Levemir to 10 units BID today    --Will follow patient during hospitalization--  Wyn Quaker RN, MSN, CDE Diabetes Coordinator Inpatient Glycemic Control Team Team Pager: [phone] (8a-5p)

## 2020-12-30 NOTE — Progress Notes (Signed)
4 Days Post-Op Procedure(s) (LRB): CORONARY ARTERY BYPASS GRAFTING (CABG), ON PUMP, TIMES THREE, USING LEFT INTERNAL MAMMARY ARTERY AND ENDOSCOPICALLY HARVESTED RIGHT GREATER SAPHENOUS VEIN (N/A) TRANSESOPHAGEAL ECHOCARDIOGRAM (TEE) (N/A) Subjective: No complaints this AM  Objective: Vital signs in last 24 hours: Temp:  [97.6 F (36.4 C)-98.4 F (36.9 C)] 97.7 F (36.5 C) (04/04 0636) Pulse Rate:  [43-150] 67 (04/04 0600) Cardiac Rhythm: Normal sinus rhythm (04/04 0400) Resp:  [16-29] 20 (04/04 0600) BP: (94-137)/(57-99) 137/81 (04/04 0600) SpO2:  [90 %-99 %] 93 % (04/04 0600)  Hemodynamic parameters for last 24 hours:    Intake/Output from previous day: 04/03 0701 - 04/04 0700 In: 921.2 [P.O.:600; I.V.:321.2] Out: -  Intake/Output this shift: No intake/output data recorded.  General appearance: alert, cooperative and no distress Neurologic: intact Heart: regular rate and rhythm Lungs: diminished breath sounds bibasilar Abdomen: normal findings: soft, non-tender  Lab Results: Recent Labs    12/29/20 0218 12/30/20 0329  WBC 13.6* 14.4*  HGB 10.9* 10.8*  HCT 34.0* 33.4*  PLT 207 263   BMET:  Recent Labs    12/29/20 0218 12/30/20 0329  NA 138 140  K 3.8 4.1  CL 107 106  CO2 26 27  GLUCOSE 117* 55*  BUN 8 13  CREATININE 0.60* 0.70  CALCIUM 8.7* 8.7*    PT/INR: No results for input(s): LABPROT, INR in the last 72 hours. ABG    Component Value Date/Time   PHART 7.375 12/26/2020 1811   HCO3 22.8 12/26/2020 1811   TCO2 24 12/26/2020 1811   ACIDBASEDEF 3.0 (H) 12/26/2020 1811   O2SAT 99.0 12/26/2020 1811   CBG (last 3)  Recent Labs    12/29/20 2103 12/30/20 0631 12/30/20 0651  GLUCAP 182* 65* 94    Assessment/Plan: S/P Procedure(s) (LRB): CORONARY ARTERY BYPASS GRAFTING (CABG), ON PUMP, TIMES THREE, USING LEFT INTERNAL MAMMARY ARTERY AND ENDOSCOPICALLY HARVESTED RIGHT GREATER SAPHENOUS VEIN (N/A) TRANSESOPHAGEAL ECHOCARDIOGRAM (TEE) (N/A) Plan  for transfer to step-down: see transfer orders  POD # 4   CV- in SR on amiodarone- convert amiodarone to PO  Was on Atenolol preop- restart, dc metoprolol RESP- on 1L [place] despite flow sheet, 98% sat  Continue IS RENAL- creatinine and lytes Ok ENDO- hypoglycemic this Am- stop levemir, continue PO meds, SSI Tolerating diet Continue cardiac rehab   LOS: 4 days    Benjamin [name] 12/30/2020

## 2020-12-30 NOTE — Progress Notes (Signed)
Patient arrived from [facility] into room 4E20. Vital signs stable and no patient complaints at this time. Will continue to monitor.  -Donnelly Angelica, RN

## 2020-12-30 NOTE — Progress Notes (Signed)
CARDIAC REHAB PHASE I   PRE:  Rate/Rhythm: 54 SB  BP:  Supine: 108/62  Sitting:   Standing:    SaO2: 96%RA  MODE:  Ambulation: 110 ft   POST:  Rate/Rhythm: 58 SB  BP:  Supine: 116/63  Sitting:   Standing:    SaO2: 99%RA 1305-1340 Pt walked 110 ft on RA with rolling walker and asst x 1. Pt stopped once to rest and stated he did not feel as well as he did this morning. C/o feeling swimmy-headed and his chest burning. Back to bed and vitals as above. Pt given warm blankets. Stated he has not slept much. RN in to see pt also and encouraged pt to rest.    Graylon Good, RN BSN  12/30/2020 1:33 PM

## 2020-12-31 LAB — BASIC METABOLIC PANEL
Anion gap: 8 (ref 5–15)
BUN: 22 mg/dL (ref 8–23)
CO2: 26 mmol/L (ref 22–32)
Calcium: 8.7 mg/dL — ABNORMAL LOW (ref 8.9–10.3)
Chloride: 103 mmol/L (ref 98–111)
Creatinine, Ser: 0.99 mg/dL (ref 0.61–1.24)
GFR, Estimated: >60 mL/min (ref >60)
Glucose, Bld: 120 mg/dL — ABNORMAL HIGH (ref 70–99)
Potassium: 4.5 mmol/L (ref 3.5–5.1)
Sodium: 137 mmol/L (ref 135–145)

## 2020-12-31 LAB — GLUCOSE, CAPILLARY
Glucose-Capillary: 132 mg/dL — ABNORMAL HIGH (ref 70–99)
Glucose-Capillary: 191 mg/dL — ABNORMAL HIGH (ref 70–99)
Glucose-Capillary: 159 mg/dL — ABNORMAL HIGH (ref 70–99)
Glucose-Capillary: 168 mg/dL — ABNORMAL HIGH (ref 70–99)

## 2020-12-31 LAB — CBC
HCT: 32.3 % — ABNORMAL LOW (ref 39.0–52.0)
Hemoglobin: 10.2 g/dL — ABNORMAL LOW (ref 13.0–17.0)
MCH: 26.8 pg (ref 26.0–34.0)
MCHC: 31.6 g/dL (ref 30.0–36.0)
MCV: 85 fL (ref 80.0–100.0)
Platelets: 266 10*3/uL (ref 150–400)
RBC: 3.8 MIL/uL — ABNORMAL LOW (ref 4.22–5.81)
RDW: 17.6 % — ABNORMAL HIGH (ref 11.5–15.5)
WBC: 12.7 10*3/uL — ABNORMAL HIGH (ref 4.0–10.5)
nRBC: 0 % (ref 0.0–0.2)

## 2020-12-31 MED ORDER — ATENOLOL 25 MG PO TABS
25.0000 mg | ORAL_TABLET | Freq: Every day | ORAL | Status: DC
  Administered 2021-01-01: 25 mg via ORAL
  Filled 2020-12-31: qty 1

## 2020-12-31 NOTE — Progress Notes (Addendum)
      [address]       [place] [address]             [phone]      5 Days Post-Op Procedure(s) (LRB): CORONARY ARTERY BYPASS GRAFTING (CABG), ON PUMP, TIMES THREE, USING LEFT INTERNAL MAMMARY ARTERY AND ENDOSCOPICALLY HARVESTED RIGHT GREATER SAPHENOUS VEIN (N/A) TRANSESOPHAGEAL ECHOCARDIOGRAM (TEE) (N/A)   Subjective:  Patient states not doing too good.  He states he isn't sleeping as every time he lays down he is able to breath good.  He isnt eating much.  Objective: Vital signs in last 24 hours: Temp:  [97.6 F (36.4 C)-98.2 F (36.8 C)] 98.2 F (36.8 C) (04/05 0257) Pulse Rate:  [48-84] 59 (04/04 2347) Cardiac Rhythm: Sinus bradycardia (04/04 1954) Resp:  [15-26] 15 (04/05 0253) BP: (101-131)/(53-91) 126/62 (04/05 0253) SpO2:  [88 %-100 %] 95 % (04/05 0253) Weight:  [60.5 kg] 60.5 kg (04/05 0652)  Intake/Output from previous day: 04/04 0701 - 04/05 0700 In: 249.9 [I.V.:249.9] Out: -   General appearance: alert, cooperative and no distress Heart: regular rate and rhythm Lungs: diminished breath sounds bibasilar Abdomen: soft, non-tender; bowel sounds normal; no masses,  no organomegaly Extremities: edema none appreciated, eechymosis of RLE Wound: clean and dry  Lab Results: Recent Labs    12/30/20 0329 12/31/20 0302  WBC 14.4* 12.7*  HGB 10.8* 10.2*  HCT 33.4* 32.3*  PLT 263 266   BMET:  Recent Labs    12/30/20 0329 12/31/20 0302  NA 140 137  K 4.1 4.5  CL 106 103  CO2 27 26  GLUCOSE 55* 120*  BUN 13 22  CREATININE 0.70 0.99  CALCIUM 8.7* 8.7*    PT/INR: No results for input(s): LABPROT, INR in the last 72 hours. ABG    Component Value Date/Time   PHART 7.375 12/26/2020 1811   HCO3 22.8 12/26/2020 1811   TCO2 24 12/26/2020 1811   ACIDBASEDEF 3.0 (H) 12/26/2020 1811   O2SAT 99.0 12/26/2020 1811   CBG (last 3)  Recent Labs    12/30/20 1648 12/30/20 2128 12/31/20 0620  GLUCAP 133* 166* 132*     Assessment/Plan: S/P Procedure(s) (LRB): CORONARY ARTERY BYPASS GRAFTING (CABG), ON PUMP, TIMES THREE, USING LEFT INTERNAL MAMMARY ARTERY AND ENDOSCOPICALLY HARVESTED RIGHT GREATER SAPHENOUS VEIN (N/A) TRANSESOPHAGEAL ECHOCARDIOGRAM (TEE) (N/A)  1. CV- PAF, now in Sinus Bradycardia in the 50s- decrease Amiodarone to 200 mg BID, as discussed with Dr. Roxan Hockey will hold Atenolol today 2. Pulm- weaning oxygen as tolerated, bilateral effusions on CXR, continue IS, will get 2V CXR in AM 3. Renal-creatinine WNL, weight is trending down, not currently on Lasix 4. Expected post operative blood loss anemia, Hgb stable at 10.2 5. DM- sugars are controlled, continue current regimen 6. Deconditioning- patient only walking 80 ft, will get PT/OT evaluation 7. Dispo- patient stable, he is bradycardic this morning with rates in the 50s, will scale back Amiodarone dose, will hold Atenolol as discussed with Dr. Roxan Hockey, will d/c EPW today, wean oxygen as tolerated, will get 2V CXR in AM, await PT/OT recs, I suspect he will need SNF at discharge   LOS: 5 days    Ellwood Handler, PA-C 12/31/2020 Patient seen and examined, agree with above Medications as above PT consult-  Remo Lipps C. Roxan Hockey, MD [facility] [phone]

## 2020-12-31 NOTE — Progress Notes (Signed)
CARDIAC REHAB PHASE I   PRE:  Rate/Rhythm: 59 SB  BP:  Supine: 118/59  Sitting:   Standing:    SaO2: 91%RA  MODE:  Ambulation: 250 ft   POST:  Rate/Rhythm: 69 SR  BP:  Supine:   Sitting: 129/57  Standing:    SaO2: 97%RA 1311-1330 Pt walked 250 ft on RA with steady gait. Stopped a couple of times to catch his breath. Encouraged pursed lip breathing. To sitting on side of bed after walk. Set up lunch tray. Encouraged IS.   Graylon Good, RN BSN  12/31/2020 1:27 PM

## 2020-12-31 NOTE — Progress Notes (Signed)
Patient pacer wires removed per physician order. Vital signs stable before and immediately after. Will maintain patient on bedrest for one hour and take vitals every 15 minutes. Left patient in stable condition and will continue to monitor.  -Donnelly Angelica, RN

## 2020-12-31 NOTE — Evaluation (Signed)
Physical Therapy Evaluation Patient Details Name: Benjamin [name] MRN: [id number] DOB: [birth year] Today's Date: 12/31/2020   History of Present Illness  Patient is a 75 y/o male who presents s/p CABG 12/26/20. PMH includes DM, CAD, tobacco abuse.  Clinical Impression  Patient presents with generalized weakness, nausea, fatigue and impaired mobilty s/p above. Pt lives at home with wife PTA and reports being independent for ADLs/IADLs and walking. Today, pt requires Min A for bed mobility and Min guard-supervision for transfers and ambulation with use of RW for support. No reports of dizziness today. VSS on RA. HR 55-79 bpm with activity. Education re: sternal precautions and "move in the tube," as well as bracing with pillow for coughing/sneezing. Will follow acutely to maximize independence and mobility prior to return home.  Pre activity BP 109/64 Post activity BP 136/68, asymptomatic.    Follow Up Recommendations No PT follow up;Supervision - Intermittent    Equipment Recommendations  None recommended by PT    Recommendations for Other Services       Precautions / Restrictions Precautions Precautions: Sternal;Fall Precaution Booklet Issued: No Restrictions Weight Bearing Restrictions: Yes LUE Partial Weight Bearing Percentage or Pounds: sternal precautions      Mobility  Bed Mobility Overal bed mobility: Needs Assistance Bed Mobility: Rolling;Sidelying to Sit Rolling: Supervision Sidelying to sit: Min assist;HOB elevated       General bed mobility comments: Cues for log roll technique, assist with trunk to get to EOB, good adherence to sternal precautions.    Transfers Overall transfer level: Needs assistance Equipment used: Rolling walker (2 wheeled) Transfers: Sit to/from Stand Sit to Stand: Min guard         General transfer comment: Min guard for safety. Cues to place hands on knees or across chest prior to standing, no assist needed. No  dizziness.  Ambulation/Gait Ambulation/Gait assistance: Min guard;Supervision Gait Distance (Feet): 250 Feet Assistive device: Rolling walker (2 wheeled) Gait Pattern/deviations: Step-through pattern;Decreased stride length   Gait velocity interpretation: 1.31 - 2.62 ft/sec, indicative of limited community ambulator General Gait Details: Slow, mostly steady gait with RW for support. + nausea (had not eaten breakfast yet). HR 55-79 bpm.  Stairs            Wheelchair Mobility    Modified Rankin (Stroke Patients Only)       Balance Overall balance assessment: No apparent balance deficits (not formally assessed)                                           Pertinent Vitals/Pain Pain Assessment: No/denies pain    Home Living Family/patient expects to be discharged to:: Private residence Living Arrangements: Spouse/significant other Available Help at Discharge: Family;Available 24 hours/day Type of Home: House Home Access: Stairs to enter Entrance Stairs-Rails: Right Entrance Stairs-Number of Steps: 6 Home Layout: One level;Laundry or work area in [place]: Civil engineer, contracting - built in;Walker - 2 wheels;Bedside commode;Hospital bed Additional Comments: rented a hospital bed    Prior Function Level of Independence: Independent         Comments: Does own ADLs/IADLs, drives.     Hand Dominance   Dominant Hand: Right    Extremity/Trunk Assessment   Upper Extremity Assessment Upper Extremity Assessment: Defer to OT evaluation    Lower Extremity Assessment Lower Extremity Assessment: Generalized weakness (but functiona')    Cervical / Trunk Assessment Cervical /  Trunk Assessment: Normal  Communication   Communication: No difficulties  Cognition Arousal/Alertness: Awake/alert Behavior During Therapy: WFL for tasks assessed/performed Overall Cognitive Status: Within Functional Limits for tasks assessed                                         General Comments General comments (skin integrity, edema, etc.): HR 55-79 bpm with activity. pre activity BP 109/64, post activity BP 136/68, asymptomatic.    Exercises     Assessment/Plan    PT Assessment Patient needs continued PT services  PT Problem List Decreased strength;Decreased mobility;Decreased balance;Decreased activity tolerance;Cardiopulmonary status limiting activity;Decreased skin integrity;Decreased knowledge of precautions       PT Treatment Interventions Therapeutic exercise;Gait training;Stair training;Patient/family education;Therapeutic activities;Functional mobility training;Balance training    PT Goals (Current goals can be found in the Care Plan section)  Acute Rehab PT Goals Patient Stated Goal: to get back to PLOF and go home PT Goal Formulation: With patient Time For Goal Achievement: 01/14/21 Potential to Achieve Goals: Good    Frequency Min 3X/week   Barriers to discharge Inaccessible home environment stairs    Co-evaluation               AM-PAC PT "6 Clicks" Mobility  Outcome Measure Help needed turning from your back to your side while in a flat bed without using bedrails?: None Help needed moving from lying on your back to sitting on the side of a flat bed without using bedrails?: A Little Help needed moving to and from a bed to a chair (including a wheelchair)?: A Little Help needed standing up from a chair using your arms (e.g., wheelchair or bedside chair)?: A Little Help needed to walk in hospital room?: A Little Help needed climbing 3-5 steps with a railing? : A Little 6 Click Score: 19    End of Session Equipment Utilized During Treatment: Gait belt Activity Tolerance: Patient tolerated treatment well Patient left: in bed;with call bell/phone within reach (sitting EOB waiting for breakfast) Nurse Communication: Mobility status PT Visit Diagnosis: Difficulty in walking, not elsewhere classified  (R26.2);Muscle weakness (generalized) (M62.81)    Time: 1916-6060 PT Time Calculation (min) (ACUTE ONLY): 19 min   Charges:   PT Evaluation $PT Eval Moderate Complexity: 1 Mod          Marisa Severin, PT, DPT Acute Rehabilitation Services Pager [phone] Office [phone]      Marguarite Arbour A Sabra Heck 12/31/2020, 11:49 AM

## 2020-12-31 NOTE — Progress Notes (Signed)
Mobility Specialist: Progress Note   12/31/20 1831  Mobility  Activity Ambulated in hall  Level of Assistance Minimal assist, patient does 75% or more  Assistive Device Front wheel walker  Distance Ambulated (ft) 340 ft  Mobility Response Tolerated well  Mobility performed by Mobility specialist  $Mobility charge 1 Mobility   Pre-Mobility: 63 HR Post-Mobility: 72 HR, 95% SpO2  Pt visibly SOB during ambulation and stopped to take one short standing break. Pt otherwise asx. Pt back to bed after walk with son present in room and call bell at his side.   [facility] Markia Kyer Mobility Specialist Mobility Specialist Phone: [phone]

## 2020-12-31 NOTE — Care Management Important Message (Signed)
Important Message  Patient Details  Name: Benjamin [name] MRN: [id number] Date of Birth: 10-17-1945   Medicare Important Message Given:  Yes     Shelda Altes 12/31/2020, 11:55 AM

## 2021-01-01 ENCOUNTER — Inpatient Hospital Stay (HOSPITAL_COMMUNITY): Payer: Medicare Other

## 2021-01-01 LAB — GLUCOSE, CAPILLARY
Glucose-Capillary: 180 mg/dL — ABNORMAL HIGH (ref 70–99)
Glucose-Capillary: 226 mg/dL — ABNORMAL HIGH (ref 70–99)
Glucose-Capillary: 160 mg/dL — ABNORMAL HIGH (ref 70–99)
Glucose-Capillary: 145 mg/dL — ABNORMAL HIGH (ref 70–99)

## 2021-01-01 MED ORDER — CEPHALEXIN 500 MG PO CAPS
500.0000 mg | ORAL_CAPSULE | Freq: Two times a day (BID) | ORAL | Status: DC
  Administered 2021-01-01 – 2021-01-02 (×3): 500 mg via ORAL
  Filled 2021-01-01 (×3): qty 1

## 2021-01-01 NOTE — Progress Notes (Signed)
Physical Therapy Treatment Patient Details Name: Benjamin [name] MRN: [id number] DOB: July 19, 1946 Today's Date: 01/01/2021    History of Present Illness Patient is a 75 y/o male who presents s/p CABG 12/26/20. PMH includes DM, CAD, tobacco abuse.    PT Comments    Pt received in supine, agreeable to therapy session and with good participation and tolerance for gait and stair training. Pt continues to benefit from RW for stability/energy conservation with hallway gait trials, needing Supervision only and performed stairs x5 with up to min guard and no loss of balance. Some bradycardia noted with HR 55-59 bpm, RN aware. Pt pulling ~700 on IS, encouraged him to continue 10x/hour due to continued increased work of breathing with exertion. Pt continues to benefit from PT services to progress toward functional mobility goals. Will plan to bring HEP handout next session for continued strengthening.   Follow Up Recommendations  No PT follow up;Supervision - Intermittent     Equipment Recommendations  None recommended by PT (RW if he does not have one)    Recommendations for Other Services       Precautions / Restrictions Precautions Precautions: Sternal;Fall Precaution Booklet Issued: Yes (comment) Precaution Comments: handout in room in folder Restrictions Weight Bearing Restrictions: No Other Position/Activity Restrictions: sternal precs    Mobility  Bed Mobility Overal bed mobility: Needs Assistance Bed Mobility: Rolling;Sidelying to Sit;Sit to Sidelying Rolling: Supervision Sidelying to sit: Min assist;HOB elevated     Sit to sidelying: Min guard;HOB elevated General bed mobility comments: Cues for log roll technique, assist with trunk to get to EOB, good adherence to sternal precautions.    Transfers Overall transfer level: Needs assistance Equipment used: Rolling walker (2 wheeled) Transfers: Sit to/from Stand Sit to Stand: Supervision         General transfer comment:  From EOB. Cues to place hands on knees or across chest prior to standing, no assist needed. No dizziness.  Ambulation/Gait Ambulation/Gait assistance: Min guard Gait Distance (Feet): 160 Feet (160, seated break, 110) Assistive device: Rolling walker (2 wheeled) Gait Pattern/deviations: Step-through pattern;Decreased stride length Gait velocity: grossly <0.6 m/s   General Gait Details: Slow, mostly steady gait with RW for support. HR brady 55-59 bpm w/gait and stair trial. SpO2 91-97% on RA, mostly >93%   Stairs Stairs: Yes Stairs assistance: Supervision Stair Management: One rail Right;Step to pattern;Forwards (R rail ascending, L rail descending) Number of Stairs: 5 General stair comments: cues for sequencing, activity pacing; step-to pattern, no LOB but increased work of breathing observed   Wheelchair Mobility    Modified Rankin (Stroke Patients Only)       Balance Overall balance assessment: No apparent balance deficits (not formally assessed)                                          Cognition Arousal/Alertness: Awake/alert Behavior During Therapy: WFL for tasks assessed/performed Overall Cognitive Status: Within Functional Limits for tasks assessed                                 General Comments: pleasant, participatory      Exercises General Exercises - Lower Extremity Ankle Circles/Pumps: AROM;Both;10 reps;Supine Long Arc Quad: AROM;Both;10 reps;Seated Hip ABduction/ADduction: AROM;Both;10 reps;Seated    General Comments General comments (skin integrity, edema, etc.): see comments under gait for VS  on RA; pulling up to 700 on IS x5 reps, encouraged hourly use x10      Pertinent Vitals/Pain Pain Assessment: 0-10 Pain Score: 2  Pain Location: sternal incision with coughing/repositioning Pain Descriptors / Indicators: Grimacing;Sore Pain Intervention(s): Monitored during session;Repositioned (encouraged continued manual  splinting)    Home Living                      Prior Function            PT Goals (current goals can now be found in the care plan section) Acute Rehab PT Goals Patient Stated Goal: to get back to PLOF and go home PT Goal Formulation: With patient Time For Goal Achievement: 01/14/21 Potential to Achieve Goals: Good Progress towards PT goals: Progressing toward goals    Frequency    Min 3X/week      PT Plan Current plan remains appropriate    Co-evaluation              AM-PAC PT "6 Clicks" Mobility   Outcome Measure  Help needed turning from your back to your side while in a flat bed without using bedrails?: None Help needed moving from lying on your back to sitting on the side of a flat bed without using bedrails?: A Little Help needed moving to and from a bed to a chair (including a wheelchair)?: A Little Help needed standing up from a chair using your arms (e.g., wheelchair or bedside chair)?: A Little Help needed to walk in hospital room?: A Little Help needed climbing 3-5 steps with a railing? : A Little 6 Click Score: 19    End of Session Equipment Utilized During Treatment: Gait belt Activity Tolerance: Patient tolerated treatment well Patient left: in bed;with call bell/phone within reach (bed in chair position, pt reports he does not like chair) Nurse Communication: Mobility status PT Visit Diagnosis: Difficulty in walking, not elsewhere classified (R26.2);Muscle weakness (generalized) (M62.81)     Time: 8676-7209 PT Time Calculation (min) (ACUTE ONLY): 30 min  Charges:  $Gait Training: 23-37 mins                     Nicloe Frontera P., PTA Acute Rehabilitation Services Pager: [phone] Office: [place] 01/01/2021, 2:56 PM

## 2021-01-01 NOTE — Evaluation (Signed)
Occupational Therapy Evaluation Patient Details Name: Benjamin [name] MRN: [id number] DOB: Jun 17, 1946 Today's Date: 01/01/2021    History of Present Illness Patient is a 75 y/o male who presents s/p CABG 12/26/20. PMH includes DM, CAD, tobacco abuse.   Clinical Impression   Patient admitted for the above diagnosis and procedure.  PTA he was independent with ADL, IADL, med management, drove, and participated with home management and meals as needed.  His spouse is in good health and can provide minimal physical assist if needed.  Barriers are listed below.  Currently, he is needing up to a supervision level for basic in room mobility and ADL performance at sink level.  Sternal precautions reviewed during ADL this date.  OT to follow in the acute setting to ensure safe discharge home, with no OT needs post acute.    Follow Up Recommendations  No OT follow up    Equipment Recommendations  None recommended by OT    Recommendations for Other Services       Precautions / Restrictions Precautions Precautions: Sternal;Fall Precaution Booklet Issued: Yes (comment) Precaution Comments: handout reviewed Restrictions Weight Bearing Restrictions: No Other Position/Activity Restrictions: sternal precs, able to push through knees with elbows tucked.      Mobility Bed Mobility Overal bed mobility: Needs Assistance Bed Mobility: Sit to Supine Rolling: Supervision Sidelying to sit: Min assist;HOB elevated   Sit to supine: Supervision Sit to sidelying: Min guard;HOB elevated General bed mobility comments: Cues for log roll technique, assist with trunk to get to EOB, good adherence to sternal precautions.    Transfers Overall transfer level: Needs assistance Equipment used: Rolling walker (2 wheeled) Transfers: Sit to/from Stand Sit to Stand: Supervision         General transfer comment: From EOB. Cues to place hands on knees or across chest prior to standing, no assist needed. No  dizziness.    Balance Overall balance assessment: No apparent balance deficits (not formally assessed)                                         ADL either performed or assessed with clinical judgement   ADL Overall ADL's : Needs assistance/impaired     Grooming: Set up;Sitting   Upper Body Bathing: Set up;Sitting   Lower Body Bathing: Supervison/ safety;Sit to/from stand   Upper Body Dressing : Supervision/safety;Sitting   Lower Body Dressing: Supervision/safety;Sit to/from stand   Toilet Transfer: Supervision/safety;Ambulation;Comfort height toilet   Toileting- Clothing Manipulation and Hygiene: Supervision/safety;Sit to/from stand       Functional mobility during ADLs: Supervision/safety General ADL Comments: no AD in room     Vision Patient Visual Report: No change from baseline       Perception     Praxis      Pertinent Vitals/Pain Pain Assessment: 0-10 Pain Score: 2  Faces Pain Scale: Hurts a little bit Pain Location: sternal incision with coughing/repositioning Pain Descriptors / Indicators: Grimacing;Sore Pain Intervention(s): Monitored during session     Hand Dominance Right   Extremity/Trunk Assessment Upper Extremity Assessment Upper Extremity Assessment: Overall WFL for tasks assessed   Lower Extremity Assessment Lower Extremity Assessment: Overall WFL for tasks assessed   Cervical / Trunk Assessment Cervical / Trunk Assessment: Normal   Communication Communication Communication: No difficulties   Cognition Arousal/Alertness: Awake/alert Behavior During Therapy: WFL for tasks assessed/performed Overall Cognitive Status: Within Functional Limits for tasks assessed  General Comments: pleasant, participatory   General Comments  walking in room without AD    Exercises Exercises: General Lower Extremity General Exercises - Lower Extremity Ankle Circles/Pumps: AROM;Both;10  reps;Supine Long Arc Quad: AROM;Both;10 reps;Seated Hip ABduction/ADduction: AROM;Both;10 reps;Seated   Shoulder Instructions      Home Living Family/patient expects to be discharged to:: Private residence Living Arrangements: Spouse/significant other Available Help at Discharge: Family;Available 24 hours/day Type of Home: House Home Access: Stairs to enter [employer] of Steps: 6 Entrance Stairs-Rails: Right Home Layout: One level;Laundry or work area in basement     [employer] Shower/Tub: Teacher, early years/pre: Handicapped height     Home Equipment: Civil engineer, contracting - built in;Walker - 2 wheels;Bedside commode;Hospital bed   Additional Comments: rented a hospital bed      Prior Functioning/Environment Level of Independence: Independent        Comments: Does own ADLs/IADLs, drives.        OT Problem List: Decreased activity tolerance;Pain      OT Treatment/Interventions: Self-care/ADL training;Energy conservation;Therapeutic activities    OT Goals(Current goals can be found in the care plan section) Acute Rehab OT Goals Patient Stated Goal: I want to get active again OT Goal Formulation: With patient Time For Goal Achievement: 01/15/21 Potential to Achieve Goals: Good ADL Goals Pt Will Perform Lower Body Bathing: Independently;sit to/from stand Pt Will Perform Lower Body Dressing: Independently;sit to/from stand Pt Will Transfer to Toilet: Independently;ambulating;regular height toilet Pt Will Perform Toileting - Clothing Manipulation and hygiene: Independently;sit to/from stand  OT Frequency: Min 2X/week   Barriers to D/C:    none noted       Co-evaluation              AM-PAC OT "6 Clicks" Daily Activity     Outcome Measure Help from another person eating meals?: None Help from another person taking care of personal grooming?: None Help from another person toileting, which includes using toliet, bedpan, or urinal?: None Help  from another person bathing (including washing, rinsing, drying)?: A Little Help from another person to put on and taking off regular upper body clothing?: A Little Help from another person to put on and taking off regular lower body clothing?: A Little 6 Click Score: 21   End of Session Nurse Communication: Mobility status  Activity Tolerance: Patient tolerated treatment well Patient left: in bed;with call bell/phone within reach  OT Visit Diagnosis: Pain Pain - Right/Left:  (sternal)                Time: 1445-1510 OT Time Calculation (min): 25 min Charges:  OT General Charges $OT Visit: 1 Visit OT Evaluation $OT Eval Moderate Complexity: 1 Mod OT Treatments $Self Care/Home Management : 8-22 mins  01/01/2021  Rich, OTR/L  Acute Rehabilitation Services  Office:  [phone]   Benjamin [name] 01/01/2021, 3:27 PM

## 2021-01-01 NOTE — Progress Notes (Signed)
Mobility Specialist: Progress Note   01/01/21 1431  Mobility  Activity Ambulated in hall  Level of Assistance Modified independent, requires aide device or extra time  Assistive Device Front wheel walker  Distance Ambulated (ft) 310 ft  Mobility Response Tolerated fair  Mobility performed by Mobility specialist  $Mobility charge 1 Mobility   Pre-Mobility: 56 HR Post-Mobility: 64 HR, 109/66 BP, 95% SpO2  Pt had DOE and needed to stop for two standing breaks lasting <1 minute each. Pt otherwise asx. Pt sitting EOB per NT request with call bell at his side.   [facility] Ermal Haberer Mobility Specialist Mobility Specialist Phone: [phone]

## 2021-01-01 NOTE — Progress Notes (Addendum)
      [address]       [place] [address]             [phone]      6 Days Post-Op Procedure(s) (LRB): CORONARY ARTERY BYPASS GRAFTING (CABG), ON PUMP, TIMES THREE, USING LEFT INTERNAL MAMMARY ARTERY AND ENDOSCOPICALLY HARVESTED RIGHT GREATER SAPHENOUS VEIN (N/A) TRANSESOPHAGEAL ECHOCARDIOGRAM (TEE) (N/A) Subjective: Worried about his left IV site. It is warm and tender to the touch  Objective: Vital signs in last 24 hours: Temp:  [97.6 F (36.4 C)-98.4 F (36.9 C)] 98.4 F (36.9 C) (04/06 0425) Pulse Rate:  [54-63] 63 (04/06 0425) Cardiac Rhythm: Normal sinus rhythm (04/05 1931) Resp:  [16-20] 20 (04/06 0425) BP: (108-152)/(59-67) 118/65 (04/06 0425) SpO2:  [93 %-100 %] 94 % (04/06 0425) Weight:  [60.2 kg] 60.2 kg (04/06 0425)     Intake/Output from previous day: 04/05 0701 - 04/06 0700 In: 600 [P.O.:600] Out: 950 [Urine:950] Intake/Output this shift: No intake/output data recorded.  General appearance: alert, cooperative and no distress Heart: regular rate and rhythm, S1, S2 normal, no murmur, click, rub or gallop Lungs: clear to auscultation bilaterally Abdomen: soft, non-tender; bowel sounds normal; no masses,  no organomegaly Extremities: extremities normal, atraumatic, no cyanosis or edema Wound: clean and dry  Lab Results: Recent Labs    12/30/20 0329 12/31/20 0302  WBC 14.4* 12.7*  HGB 10.8* 10.2*  HCT 33.4* 32.3*  PLT 263 266   BMET:  Recent Labs    12/30/20 0329 12/31/20 0302  NA 140 137  K 4.1 4.5  CL 106 103  CO2 27 26  GLUCOSE 55* 120*  BUN 13 22  CREATININE 0.70 0.99  CALCIUM 8.7* 8.7*    PT/INR: No results for input(s): LABPROT, INR in the last 72 hours. ABG    Component Value Date/Time   PHART 7.375 12/26/2020 1811   HCO3 22.8 12/26/2020 1811   TCO2 24 12/26/2020 1811   ACIDBASEDEF 3.0 (H) 12/26/2020 1811   O2SAT 99.0 12/26/2020 1811   CBG (last 3)  Recent Labs    12/31/20 1632 12/31/20 2129  01/01/21 0633  GLUCAP 159* 168* 180*    Assessment/Plan: S/P Procedure(s) (LRB): CORONARY ARTERY BYPASS GRAFTING (CABG), ON PUMP, TIMES THREE, USING LEFT INTERNAL MAMMARY ARTERY AND ENDOSCOPICALLY HARVESTED RIGHT GREATER SAPHENOUS VEIN (N/A) TRANSESOPHAGEAL ECHOCARDIOGRAM (TEE) (N/A)  1. CV- PAF, now in NSR in the 60s, continue Amiodarone to 200 mg BID, holding Atenolol today 2. Pulm- weaning oxygen as tolerated, bilateral effusions on CXR, continue IS, 2view is stable with very small bilateral pleural effusions.  3. Renal-creatinine 0.99, weight is trending down, not currently on Lasix 4. Expected post operative blood loss anemia, Hgb stable at 10.2 5. DM- sugars are controlled, continue current regimen 6. Deconditioning- walked a little better yesterday. PT recommended intermittent supervision. 7. Infiltrated IV on right lower ext. -warm to touch and tender, no erythema. Warm compress and elevation recommended. Can think about starting keflex prophylacticly.    Plan: Difficult disposition with elderly wife at home. He does not want a nursing facility and would prefer home health services to assist at home. We can work on arranging this today. Appears euvolemic on exam today, very tiny pleural effusions on CXR.    LOS: 6 days    Benjamin [name] 01/01/2021 Patient seen and examined, agree with above Hopefully home tomorrow Keflex for IV site thrombophlebitis  Revonda Standard. Roxan Hockey, MD [facility] [phone]

## 2021-01-01 NOTE — Progress Notes (Signed)
CARDIAC REHAB PHASE I   PRE:  Rate/Rhythm: 70 SB  BP:  Supine: 115/66  Sitting:   Standing:    SaO2:94%RA   MODE:  Ambulation: 200 ft   POST:  Rate/Rhythm: 63 SR  BP:  Supine:   Sitting: 114/59  Standing:    SaO2: 97%RA 1115-1150 Pt walked 200 ft on RA with rolling walker and minimal asst. Tired easily and stopped twice to rest. Sats good on RA. Pt stated he has walker at home. Stated his wife got him a hospital bed also. To sitting on side of bed. Reviewed sternal precautions and staying in the tube. Discussed smoking cessation and gave handout. Pt stated he has quit. Gave walking instructions and encouraged IS. Discussed CRP 2 and referred to [facility]. Offered discharge video but pt declined.  Gave diabetic and heart healthy diet and encouraged healthy choices.  Pt voiced understanding. Encouraged him to read [facility] booklet and materials at home. PT to see today.   Graylon Good, RN BSN  01/01/2021 11:43 AM

## 2021-01-02 ENCOUNTER — Other Ambulatory Visit: Payer: Self-pay | Admitting: Physician Assistant

## 2021-01-02 LAB — GLUCOSE, CAPILLARY: Glucose-Capillary: 171 mg/dL — ABNORMAL HIGH (ref 70–99)

## 2021-01-02 MED ORDER — TRAMADOL HCL 50 MG PO TABS: 50.0000 mg | ORAL_TABLET | ORAL | 0 refills | Status: DC | PRN

## 2021-01-02 MED ORDER — ASPIRIN 325 MG PO TBEC: 325.0000 mg | DELAYED_RELEASE_TABLET | Freq: Every day | ORAL | 0 refills | Status: DC

## 2021-01-02 MED ORDER — AMIODARONE HCL 200 MG PO TABS: 200.0000 mg | ORAL_TABLET | Freq: Two times a day (BID) | ORAL | 1 refills | Status: DC

## 2021-01-02 MED ORDER — CEPHALEXIN 500 MG PO CAPS: 500.0000 mg | ORAL_CAPSULE | Freq: Two times a day (BID) | ORAL | 0 refills | Status: AC

## 2021-01-02 NOTE — Progress Notes (Addendum)
      [address]       [place] [address]             [phone]      7 Days Post-Op Procedure(s) (LRB): CORONARY ARTERY BYPASS GRAFTING (CABG), ON PUMP, TIMES THREE, USING LEFT INTERNAL MAMMARY ARTERY AND ENDOSCOPICALLY HARVESTED RIGHT GREATER SAPHENOUS VEIN (N/A) TRANSESOPHAGEAL ECHOCARDIOGRAM (TEE) (N/A) Subjective: Feels good this morning and ready for home. He does have grown children that can help care for him and his wife during the post-op period.  Objective: Vital signs in last 24 hours: Temp:  [97.6 F (36.4 C)-98.6 F (37 C)] 97.6 F (36.4 C) (04/07 0429) Pulse Rate:  [53-63] 60 (04/07 0429) Cardiac Rhythm: Heart block (04/06 1950) Resp:  [16-20] 18 (04/07 0429) BP: (98-130)/(51-80) 130/66 (04/07 0429) SpO2:  [93 %-96 %] 93 % (04/07 0429) Weight:  [60 kg] 60 kg (04/07 0429)    Intake/Output from previous day: 04/06 0701 - 04/07 0700 In: 120 [P.O.:120] Out: -  Intake/Output this shift: No intake/output data recorded.  General appearance: alert, cooperative and no distress Heart: regular rate and rhythm, S1, S2 normal, no murmur, click, rub or gallop Lungs: clear to auscultation bilaterally Abdomen: soft, non-tender; bowel sounds normal; no masses,  no organomegaly Extremities: extremities normal, atraumatic, no cyanosis or edema Wound: clean and dry, infiltrated IV with some tenderness on the right forearm  Lab Results: Recent Labs    12/31/20 0302  WBC 12.7*  HGB 10.2*  HCT 32.3*  PLT 266   BMET:  Recent Labs    12/31/20 0302  NA 137  K 4.5  CL 103  CO2 26  GLUCOSE 120*  BUN 22  CREATININE 0.99  CALCIUM 8.7*    PT/INR: No results for input(s): LABPROT, INR in the last 72 hours. ABG    Component Value Date/Time   PHART 7.375 12/26/2020 1811   HCO3 22.8 12/26/2020 1811   TCO2 24 12/26/2020 1811   ACIDBASEDEF 3.0 (H) 12/26/2020 1811   O2SAT 99.0 12/26/2020 1811   CBG (last 3)  Recent Labs    01/01/21 1604  01/01/21 2111 01/02/21 0618  GLUCAP 160* 145* 171*    Assessment/Plan: S/P Procedure(s) (LRB): CORONARY ARTERY BYPASS GRAFTING (CABG), ON PUMP, TIMES THREE, USING LEFT INTERNAL MAMMARY ARTERY AND ENDOSCOPICALLY HARVESTED RIGHT GREATER SAPHENOUS VEIN (N/A) TRANSESOPHAGEAL ECHOCARDIOGRAM (TEE) (N/A)  1. CV- PAF, now in NSR in the 60s, continue Amiodarone to 200 mg BID, holding Atenolol for now 2. Pulm- weaning oxygen as tolerated, stable small bilateral effusions on CXR, continue IS 3. Renal-creatinine 0.99, weight is trending down, not currently on Lasix 4. Expected post operative blood loss anemia, Hgb stable at 10.2 5. DM- sugars are controlled, continue current regimen 6. Deconditioning- walked a little better yesterday. PT recommended intermittent supervision. 7. Infiltrated IV on right lower ext. -warm to touch and tender, no erythema. Warm compress and elevation recommended. Keflex 500mg  BID x 5 days.  Plan: Discussed during progression yesterday. No needs identified by the team. Will plan to send home today, patient agrees and was able to get some help from his sons. Discharge instructions discussed with the patient.    LOS: 7 days    Elgie Collard 01/02/2021 Patient seen and examined, agree with above Dc home today  Revonda Standard. Roxan Hockey, MD [facility] [phone]

## 2021-01-02 NOTE — Progress Notes (Signed)
Discharge instructions (including medications) discussed with and copy provided to patient/caregiver 

## 2021-01-02 NOTE — Progress Notes (Signed)
Chest tube sutures removed per order. Benzoin and steri-strips applied. Sites clean and dry.    Raelyn Number, RN  12:13 PM 01/02/21

## 2021-01-02 NOTE — Discharge Instructions (Signed)
[facility] office  [phone]   Endoscopic Saphenous Vein Harvesting, Care After This sheet gives you information about how to care for yourself after your procedure. Your health care provider may also give you more specific instructions. If you have problems or questions, contact your health care provider. What can I expect after the procedure? After the procedure, it is common to have:  Pain.  Bruising.  Swelling.  Numbness. Follow these instructions at home: Incision care  Follow instructions from your health care provider about how to take care of your incisions. Make sure you: ? Wash your hands with soap and water before and after you change your bandages (dressings). If soap and water are not available, use hand sanitizer. ? Change your dressings as told by your health care provider. ? Leave stitches (sutures), skin glue, or adhesive strips in place. These skin closures may need to stay in place for 2 weeks or longer. If adhesive strip edges start to loosen and curl up, you may trim the loose edges. Do not remove adhesive strips completely unless your health care provider tells you to do that.  Check your incision areas every day for signs of infection. Check for: ? More redness, swelling, or pain. ? Fluid or blood. ? Warmth. ? Pus or a bad smell.   Medicines  Take over-the-counter and prescription medicines only as told by your health care provider.  Ask your health care provider if the medicine prescribed to you requires you to avoid driving or using heavy machinery. General instructions  Raise (elevate) your legs above the level of your heart while you are sitting or lying down.  Avoid crossing your legs.  Avoid sitting for long periods of time. Change positions every 30 minutes.  Do any exercises your health care providers have given you. These may include deep breathing, coughing, and walking exercises.  Do not take baths, swim, or use a hot tub until your health  care provider approves. Ask your health care provider if you may take showers. You may only be allowed to take sponge baths.  Wear compression stockings as told by your health care provider. These stockings help to prevent blood clots and reduce swelling in your legs.  Keep all follow-up visits as told by your health care provider. This is important. Contact a health care provider if:  Medicine does not help your pain.  Your pain gets worse.  You have new leg bruises or your leg bruises get bigger.  Your leg feels numb.  You have more redness, swelling, or pain around your incision.  You have fluid or blood coming from your incision.  Your incision feels warm to the touch.  You have pus or a bad smell coming from your incision.  You have a fever. Get help right away if:  Your pain is severe.  You develop pain, tenderness, warmth, redness, or swelling in any part of your leg.  You have chest pain.  You have trouble breathing. Summary  Raise (elevate) your legs above the level of your heart while you are sitting or lying down.  Wear compression stockings as told by your health care provider.  Make sure you know which symptoms should prompt you to contact your health care provider.  Keep all follow-up visits as told by your health care provider. This information is not intended to replace advice given to you by your health care provider. Make sure you discuss any questions you have with your health care provider. Document Revised:  08/22/2018 Document Reviewed: 08/22/2018 Elsevier Patient Education  [place]. Coronary Artery Bypass Grafting, Care After This sheet gives you information about how to care for yourself after your procedure. Your doctor may also give you more specific instructions. If you have problems or questions, call your doctor. What can I expect after the procedure? After the procedure, it is common to:  Feel sick to your stomach  (nauseous).  Not want to eat as much as normal (lack of appetite).  Have trouble pooping (constipation).  Have weakness and tiredness (fatigue).  Feel sad (depressed) or grouchy (irritable).  Have pain or discomfort around the cuts from surgery (incisions). Follow these instructions at home: Medicines  Take over-the-counter and prescription medicines only as told by your doctor. Do not stop taking medicines or start any new medicines unless your doctor says it is okay.  If you were prescribed an antibiotic medicine, take it as told by your doctor. Do not stop taking the antibiotic even if you start to feel better. Incision care  Follow instructions from your doctor about how to take care of your cuts from surgery. Make sure you: ? Wash your hands with soap and water before and after you change your bandage (dressing). If you cannot use soap and water, use hand sanitizer. ? Change your bandage as told by your doctor. ? Leave stitches (sutures), skin glue, or skin tape (adhesive) strips in place. They may need to stay in place for 2 weeks or longer. If tape strips get loose and curl up, you may trim the loose edges. Do not remove tape strips completely unless your doctor says it is okay.  Make sure the surgery cuts are clean, dry, and protected.  Check your cut areas every day for signs of infection. Check for: ? More redness, swelling, or pain. ? More fluid or blood. ? Warmth. ? Pus or a bad smell.  If cuts were made in your legs: ? Avoid crossing your legs. ? Avoid sitting for long periods of time. Change positions every 30 minutes. ? Raise (elevate) your legs when you are sitting.   Bathing  Do not take baths, swim, or use a hot tub until your doctor says it is okay.  You may shower. Pat the surgery cuts dry. Do not rub the cuts to dry. Eating and drinking  Eat foods that are high in fiber, such as beans, nuts, whole grains, and raw fruits and vegetables. Any meats you eat  should be lean cut. Avoid canned, processed, and fried foods. This can help prevent trouble pooping. This is also a part of a heart-healthy diet.  Drink enough fluid to keep your pee (urine) pale yellow.  Do not drink alcohol until you are fully recovered. Ask your doctor when it is safe to drink alcohol.   Activity  Rest and limit your activity as told by your doctor. You may be told to: ? Stop any activity right away if you have chest pain, shortness of breath, irregular heartbeats, or dizziness. Get help right away if you have any of these symptoms. ? Move around often for short periods or take short walks as told by your doctor. Slowly increase your activities. ? Avoid lifting, pushing, or pulling anything that is heavier than 10 lb (4.5 kg) for at least 6 weeks or as told by your doctor.  Do physical therapy or a cardiac rehab (cardiac rehabilitation) program as told by your doctor. ? Physical therapy involves doing exercises to maintain movement  and build strength and endurance. ? A cardiac rehab program includes:  Exercise training.  Education.  Counseling.  Do not drive until your doctor says it is okay.  Ask your doctor when you can go back to work.  Ask your doctor when you can be sexually active. General instructions  Do not drive or use heavy machinery while taking prescription pain medicine.  Do not use any products that contain nicotine or tobacco. These include cigarettes, e-cigarettes, and chewing tobacco. If you need help quitting, ask your doctor.  Take 2-3 deep breaths every few hours during the day while you get better. This helps expand your lungs and prevent problems.  If you were given a device called an incentive spirometer, use it several times a day to practice deep breathing. Support your chest with a pillow or your arms when you take deep breaths or cough.  Wear compression stockings as told by your doctor.  Weigh yourself every day. This helps to  see if your body is holding (retaining) fluid that may make your heart and lungs work harder.  Keep all follow-up visits as told by your doctor. This is important. Contact a doctor if:  You have more redness, swelling, or pain around any cut.  You have more fluid or blood coming from any cut.  Any cut feels warm to the touch.  You have pus or a bad smell coming from any cut.  You have a fever.  You have swelling in your ankles or legs.  You have pain in your legs.  You gain 2 lb (0.9 kg) or more a day.  You feel sick to your stomach or you throw up (vomit).  You have watery poop (diarrhea). Get help right away if:  You have chest pain that goes to your jaw or arms.  You are short of breath.  You have a fast or irregular heartbeat.  You notice a "clicking" in your breastbone (sternum) when you move.  You have any signs of a stroke. "BE FAST" is an easy way to remember the main warning signs: ? B - Balance. Signs are dizziness, sudden trouble walking, or loss of balance. ? E - Eyes. Signs are trouble seeing or a change in how you see. ? F - Face. Signs are sudden weakness or loss of feeling of the face, or the face or eyelid drooping on one side. ? A - Arms. Signs are weakness or loss of feeling in an arm. This happens suddenly and usually on one side of the body. ? S - Speech. Signs are sudden trouble speaking, slurred speech, or trouble understanding what people say. ? T - Time. Time to call emergency services. Write down what time symptoms started.  You have other signs of a stroke, such as: ? A sudden, very bad headache with no known cause. ? Feeling sick to your stomach. ? Throwing up. ? Jerky movements you cannot control (seizure). These symptoms may be an emergency. Do not wait to see if the symptoms will go away. Get medical help right away. Call your local emergency services (911 in the [place] Do not drive yourself to the hospital. Summary  After the  procedure, it is common to have pain or discomfort in the cuts from surgery (incisions).  Do not take baths, swim, or use a hot tub until your doctor says it is okay.  Slowly increase your activities. You may need physical therapy or cardiac rehab.  Weigh yourself every day. This helps  to see if your body is holding fluid. This information is not intended to replace advice given to you by your health care provider. Make sure you discuss any questions you have with your health care provider. Document Revised: 05/24/2018 Document Reviewed: 05/24/2018 Elsevier Patient Education  2021 [place].   Discharge Instructions:  1. You may shower, please wash incisions daily with soap and water and keep dry.  If you wish to cover wounds with dressing you may do so but please keep clean and change daily.  No tub baths or swimming until incisions have completely healed.  If your incisions become red or develop any drainage please call our office at [phone]  2. No Driving until cleared by our office and you are no longer using narcotic pain medications  3. Monitor your weight daily.. Please use the same scale and weigh at same time... If you gain 5-10 lbs in 48 hours with associated lower extremity swelling, please contact our office at [phone]  4. Fever of 101.5 for at least 24 hours with no source, please contact our office at [phone]  5. Activity- up as tolerated, please walk at least 3 times per day.  Avoid strenuous activity, no lifting, pushing, or pulling with your arms over 8-10 lbs for a minimum of 6 weeks  6. If any questions or concerns arise, please do not hesitate to contact our office at [phone]  7. As appetite increases, we may need to add back some of your diabetic medication. Please remember to check your blood glucose level several times a day. If you have a few readings in a row that are high (> 200) please contact either our office or your diabetes doctor for  medication adjustments.

## 2021-01-02 NOTE — Progress Notes (Signed)
Mobility Specialist: Progress Note   01/02/21 1137  Mobility  Activity Ambulated in hall  Level of Assistance Modified independent, requires aide device or extra time  Assistive Device Front wheel walker  Distance Ambulated (ft) 310 ft  Mobility Response Tolerated well  Mobility performed by Mobility specialist  $Mobility charge 1 Mobility   Pt asx during ambulation. Pt was able to ambulate w/o taking any breaks. Pt sitting EOB with call bell and is eager for discharge.   [facility] Betzayda Braxton Mobility Specialist Mobility Specialist Phone: [phone]

## 2021-01-02 NOTE — Plan of Care (Signed)
  Problem: Education: Goal: Will demonstrate proper wound care and an understanding of methods to prevent future damage Outcome: Adequate for Discharge Goal: Knowledge of disease or condition will improve Outcome: Adequate for Discharge Goal: Knowledge of the prescribed therapeutic regimen will improve Outcome: Adequate for Discharge Goal: Individualized Educational Video(s) Outcome: Adequate for Discharge

## 2021-01-02 NOTE — Plan of Care (Signed)

## 2021-01-03 NOTE — Progress Notes (Signed)
**Note [name]-Identified via Obfuscation** Benjamin [name] Nurse Date of Birth: October 30, 1945 Medical Record #[id number]  History of Present Illness: Benjamin [name] is seen as a work in for evaluation of chest pain. He has a history of coronary disease with prior angioplasty of the proximal LAD in 1991. He also had angioplasty of the circumflex at that time. He has known occlusion of the right coronary with collateral flow. Last Myoview study in October 2015 showed a small anterolateral scar. No ischemia.    On a prior  visit he described 3 episodes of chest pain for which he took sl Ntg. Lasted 5-10 minutes. No clear triggers. One time he became diaphoretic.  He denies dyspnea or palpitation.  He had a Myoview study showing anterior ischemia. This led to a cardiac cath in February 2020 showing occluded RCA and moderate nonobstructive disease in the LAD and LCx. Unchanged from prior study. Medical therapy recommended.   He was on maximal antianginal therapy with  aspirin, atenolol, and isosorbide, Ranexa. He presented in March with  unstable angina. Cardiac cath showed progressive multivessel CAD. He subsequently underwent CABG by Dr Roxan Hockey with LIMA to the LAD, SVG to diagonal and SVG to OM. It was noted that the RCA branches were too small to graft. His post op course was fairly uneventful. He had Afib on post op day #3 but converted on amiodarone. He had post op anemia.   On follow up today he reports he doesn't feel well. Chest is still sore. Feels nauseated and appetite is poor. His weight is down 10 lbs. He has not resumed smoking. Feels weak. No angina or SOB. Has noted his heart racing a couple of times.     Allergies as of 01/08/2021      Reactions   Sulfa Drugs Cross Reactors Rash      Medication List       Accurate as of January 08, 2021 11:35 AM. If you have any questions, ask your nurse or doctor.        STOP taking these medications   amiodarone 200 MG tablet Commonly known as: PACERONE Stopped by: Tjuana Vickrey Martinique, MD      TAKE these medications   acetaminophen 500 MG tablet Commonly known as: TYLENOL Take 1,000 mg by mouth every 6 (six) hours as needed for moderate pain or headache.   aspirin 325 MG EC tablet Take 1 tablet (325 mg total) by mouth daily.   atenolol 25 MG tablet Commonly known as: TENORMIN Take 1 tablet (25 mg total) by mouth daily. Started by: Braiden Presutti Martinique, MD   atorvastatin 80 MG tablet Commonly known as: LIPITOR TAKE 1 TABLET BY MOUTH DAILY   dapagliflozin propanediol 5 MG Tabs tablet Commonly known as: Farxiga Take 1 tablet (5 mg total) by mouth daily before breakfast. Started by: Jahmarion Popoff Martinique, MD   glimepiride 2 MG tablet Commonly known as: Amaryl Take 1 tablet (2 mg total) by mouth daily with breakfast. Started by: Denver Harder Martinique, MD   Januvia 100 MG tablet Generic drug: sitaGLIPtin Take 100 mg by mouth daily with lunch.   metFORMIN 1000 MG tablet Commonly known as: GLUCOPHAGE Take 1 tablet (1,000 mg total) by mouth 2 (two) times daily.   traMADol 50 MG tablet Commonly known as: ULTRAM Take 1 tablet (50 mg total) by mouth every 4 (four) hours as needed for moderate pain.        Allergies  Allergen Reactions  . Sulfa Drugs Cross Reactors Rash    Past Medical History:  Diagnosis  Date  . Asthma    as a child, no problems as an adult, no inhaler  . Coronary artery disease    Prior PCI to the prox LAD in 1991; S/P PCI to LCX in October 1991  . Diabetes mellitus    TYPE 2  . GERD (gastroesophageal reflux disease)    occasional - diet controlled  . History of kidney stones    many yrs ago  . Hyperlipidemia   . Tobacco abuse    quit 09/2020 - smoked for 64 yrs    Past Surgical History:  Procedure Laterality Date  . COLONOSCOPY WITH PROPOFOL N/A 04/21/2016   Procedure: COLONOSCOPY WITH PROPOFOL;  Surgeon: Garlan Fair, MD;  Location: [facility];  Service: Endoscopy;  Laterality: N/A;  . CORONARY ANGIOPLASTY  05/1990   PROXIMAL LAD  . CORONARY  ANGIOPLASTY  06/1990   LEFT CIRCUMFLEX CORONARY  . CORONARY ARTERY BYPASS GRAFT N/A 12/26/2020   Procedure: CORONARY ARTERY BYPASS GRAFTING (CABG), ON PUMP, TIMES THREE, USING LEFT INTERNAL MAMMARY ARTERY AND ENDOSCOPICALLY HARVESTED RIGHT GREATER SAPHENOUS VEIN;  Surgeon: Melrose Nakayama, MD;  Location: [facility];  Service: Open Heart Surgery;  Laterality: N/A;  . KIDNEY STONE SURGERY Bilateral    x 2 surgeries  . LEFT HEART CATH AND CORONARY ANGIOGRAPHY N/A 11/18/2018   Procedure: LEFT HEART CATH AND CORONARY ANGIOGRAPHY;  Surgeon: Martinique, Anglea Gordner M, MD;  Location: [facility];  Service: Cardiovascular;  Laterality: N/A;  . LEFT HEART CATH AND CORONARY ANGIOGRAPHY N/A 12/17/2020   Procedure: LEFT HEART CATH AND CORONARY ANGIOGRAPHY;  Surgeon: Martinique, Carmine Youngberg M, MD;  Location: [facility];  Service: Cardiovascular;  Laterality: N/A;  . TEE WITHOUT CARDIOVERSION N/A 12/26/2020   Procedure: TRANSESOPHAGEAL ECHOCARDIOGRAM (TEE);  Surgeon: Melrose Nakayama, MD;  Location: [facility];  Service: Open Heart Surgery;  Laterality: N/A;  . UMBILICAL HERNIA REPAIR      Social History   Tobacco Use  Smoking Status Former Smoker  . Packs/day: 1.00  . Years: 64.00  . Pack years: 64.00  . Types: Cigarettes  . Quit date: 09/28/2020  . Years since quitting: 0.2  Smokeless Tobacco Never Used    Social History   Substance and Sexual Activity  Alcohol Use Yes   Comment: every other weekend - liquor    Family History  Problem Relation Age of Onset  . Heart disease Sister        had open heart surgery    Review of Systems: As noted in history of present illness.  All other systems were reviewed and are negative.  Physical Exam: BP 115/69   Pulse 78   Ht 5\' 4"  (1.626 m)   Wt 133 lb 2 oz (60.4 kg)   SpO2 98%   BMI 22.85 kg/m  GENERAL:  Well appearing WM in NAD HEENT:  PERRL, EOMI, sclera are clear. Oropharynx is clear. NECK:  No jugular venous distention, carotid upstroke brisk  and symmetric, no bruits, no thyromegaly or adenopathy LUNGS:  Clear to auscultation bilaterally CHEST:  Incisions healing well.  HEART:  RRR,  PMI not displaced or sustained,S1 and S2 within normal limits, no S3, no S4: no clicks, no rubs, no murmurs ABD:  Soft, nontender. BS +, no masses or bruits. No hepatomegaly, no splenomegaly EXT:  2 + pulses throughout, no edema, no cyanosis no clubbing. Vein graft site bruised without redness.  SKIN:  Warm and dry.  No rashes NEURO:  Alert and oriented x 3.  Cranial nerves II through XII intact. PSYCH:  Cognitively intact   LABORATORY DATA:  Lab Results  Component Value Date   WBC 12.7 (H) 12/31/2020   HGB 10.2 (L) 12/31/2020   HCT 32.3 (L) 12/31/2020   PLT 266 12/31/2020   GLUCOSE 120 (H) 12/31/2020   CHOL 129 12/13/2020   TRIG 80 12/13/2020   HDL 64 12/13/2020   LDLCALC 49 12/13/2020   ALT 47 (H) 12/25/2020   AST 32 12/25/2020   NA 137 12/31/2020   K 4.5 12/31/2020   CL 103 12/31/2020   CREATININE 0.99 12/31/2020   BUN 22 12/31/2020   CO2 26 12/31/2020   INR 1.6 (H) 12/26/2020   HGBA1C 8.5 (H) 12/25/2020   Labs dated 08/05/16: cholesterol 115, triglycerides 56, LDL 49, HDL 56. A1c 7.4%. CMET normal. Dated 08/30/17: cholesterol 120, triglycerides 58, HDL 46, LDL 62. A1c 7.8%. Chemistries and CBC normal.  Dated 09/16/18: cholesterol 106, triglycerides 51, HDL 48, LDL48. A1c 7.4%. CMET normal Dated 03/27/19: A1c 7.5%. BMET normal Dated 10/19/19: cholesterol 119, triglycerides 52, HDL 51, LDL 56, CMET normal Dated 04/10/20: A1c 6.8%.  Dated 10/10/20: cholesterol 123, triglycerides 56, HDL 55, LDL 56. A1c 7.1%. CMET normal.   Ecg today shows NSR rate 78 with nonspecific ST depression. QT is prolonged.  I have personally reviewed and interpreted this study.  Myoview 11/09/18: Study Highlights     The left ventricular ejection fraction is normal (55-65%).  Nuclear stress EF: 59%.  Blood pressure demonstrated a normal response to  exercise.  There was no ST segment deviation noted during stress.  No T wave inversion was noted during stress.  Defect 1: There is a small defect of moderate severity present in the mid anterolateral, apical lateral and apex location.  Findings consistent with ischemia.  This is an intermediate risk study.   The anterolateral perfusion defect on the prior exam was interpreted as fixed, however on today's exam appears to have reversible ischemia. Wall motion appears abnormal in this region.    Cardiac cath 11/18/18:  LEFT HEART CATH AND CORONARY ANGIOGRAPHY  Conclusion     Prox LAD lesion is 45% stenosed.  Prox LAD to Mid LAD lesion is 65% stenosed.  Ost Cx to Prox Cx lesion is 50% stenosed.  Prox Cx to Mid Cx lesion is 50% stenosed.  Prox RCA to Dist RCA lesion is 100% stenosed.  The left ventricular systolic function is normal.  LV end diastolic pressure is normal.  The left ventricular ejection fraction is 55-65% by visual estimate.   1. Single vessel occlusive CAD with CTO of the RCA. Left to right collaterals. There is diffuse moderate calcific disease involving the LAD and LCx that is not significantly changed since 2011. 2. Normal LV function 3. Normal LV EDP  Plan: continued medical therapy   Cardiac cath 12/17/20:  LEFT HEART CATH AND CORONARY ANGIOGRAPHY    Conclusion    Prox LAD lesion is 45% stenosed.  Prox LAD to Mid LAD lesion is 75% stenosed.  Ost Cx to Prox Cx lesion is 90% stenosed.  Prox Cx to Mid Cx lesion is 60% stenosed.  Prox RCA to Dist RCA lesion is 100% stenosed.  Ost LM to Mid LM lesion is 30% stenosed.  Ost LAD lesion is 80% stenosed.  The left ventricular systolic function is normal.  LV end diastolic pressure is normal.  The left ventricular ejection fraction is 55-65% by visual estimate.   1. Severe 3 vessel obstructive CAD. Patient  has CTO of the RCA. Progressive ostial LAD and LCX disease. Severely calcified  vessels 2. Normal LV function 3. Normal LVEDP  Plan: recommend referral to CT surgery for consideration of CABG.   Coronary Diagrams   Diagnostic Dominance: Right    Intervention   Intraop TEE 5/00/37: Complications: No known complications during this procedure.  POST-OP IMPRESSIONS  - Left Ventricle: The left ventricle is unchanged from pre-bypass.  - Right Ventricle: The right ventricle appears unchanged from pre-bypass.  - Aorta: The aorta appears unchanged from pre-bypass.  - Left Atrium: The left atrium appears unchanged from pre-bypass.  - Left Atrial Appendage: The left atrial appendage appears unchanged from  pre-bypass.  - Aortic Valve: The aortic valve appears unchanged from pre-bypass.  - Mitral Valve: The mitral valve appears unchanged from pre-bypass.  - Tricuspid Valve: The tricuspid valve appears unchanged from pre-bypass.  - Interatrial Septum: The interatrial septum appears unchanged from  pre-bypass.  - Interventricular Septum: The interventricular septum appears unchanged  from  pre-bypass.  - Pericardium: The pericardium appears unchanged from pre-bypass.   PRE-OP FINDINGS  Left Ventricle: The left ventricle has normal systolic function, with an  ejection fraction of 55-60%. The cavity size was normal. There is no left  ventricular hypertrophy.    Right Ventricle: The right ventricle has normal systolic function. The  cavity was normal. There is no increase in right ventricular wall  thickness.   Left Atrium: Left atrial size was normal in size. No left atrial/left  atrial appendage thrombus was detected.   Right Atrium: Right atrial size was normal in size.   Interatrial Septum: No atrial level shunt detected by color flow Doppler.   Pericardium: There is no evidence of pericardial effusion.   Mitral Valve: The mitral valve is normal in structure. Mitral valve  regurgitation is not visualized by color flow Doppler. There is No  evidence  of mitral stenosis.   Tricuspid Valve: The tricuspid valve was normal in structure. Tricuspid  valve regurgitation was not visualized by color flow Doppler.   Aortic Valve: The aortic valve is tricuspid Aortic valve regurgitation was  not visualized by color flow Doppler. There is no stenosis of the aortic  valve.    Pulmonic Valve: The pulmonic valve was not assessed.  Pulmonic valve regurgitation was not assessed by color flow Doppler.      Renold Don MD   Assessment / Plan: 1. Coronary disease with remote angioplasty of the LAD and left circumflex coronary  in 1991. Chronic total occlusion of the right coronary. Repeat evaluation with cardiac cath in February 2020 was stable.   He presented in March with  unstable angina. Cardiac cath showed progressive multivessel CAD. He subsequently underwent CABG by Dr Roxan Hockey with LIMA to the LAD, SVG to diagonal and SVG to OM. It was noted that the RCA branches were too small to graft. No significant angina. Off Ranexa and nitrates. Will resume atenolol 25 mg daily.   2. Hyperlipidemia. Continue on high-dose atorvastatin. Excellent control. At goal.  3. Diabetes mellitus type 2.  on metformin,  Januvia. Sugars running higher. Will resume Farxiga 5 mg daily and glimepiride 2 mg daily. Follow up with Dr Alroy Dust.   4. Tobacco abuse. Now stopped.   5. Post op Afib. Converted on amiodarone. I think his poor appetite and nausea are due to amiodarone. Will discontinue. Resume atenolol. If he has recurrent Afib will need to consider alternative RX.   Follow up in 2 months.

## 2021-01-07 ENCOUNTER — Telehealth (HOSPITAL_COMMUNITY): Payer: Self-pay

## 2021-01-07 NOTE — Telephone Encounter (Signed)
Pt insurance is active and benefits verified through [facility] Medicare Co-pay 0, DED 0/0 met, out of pocket $3,600/$398.84 met, co-insurance 0%. no pre-authorization required. Passport, 01/07/2021'@2' :31pm, REF# [id number]  Will contact patient to see if he is interested in the Cardiac Rehab Program. If interested, patient will need to complete follow up appt. Once completed, patient will be contacted for scheduling upon review by the RN Navigator.

## 2021-01-07 NOTE — Telephone Encounter (Signed)
Called patient to see if he is interested in the Cardiac Rehab Program. Patient's wife expressed interest for pt. Explained scheduling process and went over insurance, patient's wife verbalized understanding. Will contact patient for scheduling once f/u has been completed.

## 2021-01-08 ENCOUNTER — Encounter: Payer: Self-pay | Admitting: Cardiology

## 2021-01-08 ENCOUNTER — Ambulatory Visit: Payer: Medicare Other | Admitting: Cardiology

## 2021-01-08 ENCOUNTER — Other Ambulatory Visit: Payer: Self-pay

## 2021-01-08 VITALS — BP 115/69 | HR 78 | Ht 64.0 in | Wt 133.1 lb

## 2021-01-08 DIAGNOSIS — E7800 Pure hypercholesterolemia, unspecified: Secondary | ICD-10-CM | POA: Diagnosis not present

## 2021-01-08 DIAGNOSIS — Z720 Tobacco use: Secondary | ICD-10-CM | POA: Diagnosis not present

## 2021-01-08 DIAGNOSIS — I480 Paroxysmal atrial fibrillation: Secondary | ICD-10-CM

## 2021-01-08 DIAGNOSIS — E119 Type 2 diabetes mellitus without complications: Secondary | ICD-10-CM

## 2021-01-08 DIAGNOSIS — I25119 Atherosclerotic heart disease of native coronary artery with unspecified angina pectoris: Secondary | ICD-10-CM

## 2021-01-08 MED ORDER — ATENOLOL 25 MG PO TABS: 25.0000 mg | ORAL_TABLET | Freq: Every day | ORAL | 3 refills | Status: DC

## 2021-01-08 MED ORDER — DAPAGLIFLOZIN PROPANEDIOL 5 MG PO TABS: 5.0000 mg | ORAL_TABLET | Freq: Every day | ORAL | 11 refills | Status: DC

## 2021-01-08 MED ORDER — GLIMEPIRIDE 2 MG PO TABS: 2.0000 mg | ORAL_TABLET | Freq: Every day | ORAL | 11 refills | Status: DC

## 2021-01-08 NOTE — Patient Instructions (Addendum)
Stop taking amiodarone  Resume Farxiga at 5 mg daily and glimepiride 2 mg daily  Continue your other therapy  Resume atenolol 25 mg daily  Follow up in 2 months.

## 2021-01-09 ENCOUNTER — Telehealth: Payer: Self-pay | Admitting: Cardiology

## 2021-01-09 NOTE — Telephone Encounter (Signed)
Wife updated and verbalized understanding.  

## 2021-01-09 NOTE — Telephone Encounter (Signed)
Spoke to pt's wife. She state yesterday Dr. Martinique stopped amiodarone and started atenolol, farxiga, and glimepiride. Wife state she is worried amiodarone is still in his system because he took it yesterday morning and questioning when it's safe for pt to start new medication.

## 2021-01-09 NOTE — Telephone Encounter (Signed)
Please HOLD atenolol until tomorrow morning. Okay to take all other medication as prescribed.

## 2021-01-09 NOTE — Telephone Encounter (Signed)
Pt c/o medication issue:  1. Name of Medication: Amiodarone 200 MG  2. How are you currently taking this medication (dosage and times per day)? Patient stopped taking this medication  3. Are you having a reaction (difficulty breathing--STAT)? No   4. What is your medication issue?  Patient's wife is calling regarding recent medication changes. Patient has been put on Atenolol, Dapagliflozin Propanediol, and Glimepiride. She is concerned that if the patient starts these medications today, it may cause a reaction if Amiodarone is still in his system. She would like to know when is a safe date for the patient to start these medications.

## 2021-01-13 NOTE — Telephone Encounter (Signed)
Spoke with pt wife and gave her an update on where we was with the pt, advised pt's wife that pt would have to complete his follow up on 02/04/2021 before he is cleared. Pt's wife understood.

## 2021-01-15 DIAGNOSIS — I10 Essential (primary) hypertension: Secondary | ICD-10-CM | POA: Diagnosis not present

## 2021-01-15 DIAGNOSIS — E113293 Type 2 diabetes mellitus with mild nonproliferative diabetic retinopathy without macular edema, bilateral: Secondary | ICD-10-CM | POA: Diagnosis not present

## 2021-01-15 DIAGNOSIS — I2510 Atherosclerotic heart disease of native coronary artery without angina pectoris: Secondary | ICD-10-CM | POA: Diagnosis not present

## 2021-02-03 ENCOUNTER — Other Ambulatory Visit: Payer: Self-pay | Admitting: Thoracic Surgery (Cardiothoracic Vascular Surgery)

## 2021-02-03 DIAGNOSIS — Z951 Presence of aortocoronary bypass graft: Secondary | ICD-10-CM

## 2021-02-04 ENCOUNTER — Ambulatory Visit (INDEPENDENT_AMBULATORY_CARE_PROVIDER_SITE_OTHER): Payer: Self-pay | Admitting: Thoracic Surgery (Cardiothoracic Vascular Surgery)

## 2021-02-04 ENCOUNTER — Ambulatory Visit
Admission: RE | Admit: 2021-02-04 | Discharge: 2021-02-04 | Disposition: A | Discharge status: Home / Self Care | Payer: Medicare Other | Source: Ambulatory Visit | Attending: Thoracic Surgery (Cardiothoracic Vascular Surgery) | Admitting: Thoracic Surgery (Cardiothoracic Vascular Surgery)

## 2021-02-04 ENCOUNTER — Other Ambulatory Visit: Payer: Self-pay

## 2021-02-04 VITALS — BP 124/85 | HR 135 | Temp 97.6°F | Resp 20 | Ht 64.0 in | Wt 131.0 lb

## 2021-02-04 DIAGNOSIS — Z951 Presence of aortocoronary bypass graft: Secondary | ICD-10-CM | POA: Diagnosis not present

## 2021-02-04 DIAGNOSIS — I2510 Atherosclerotic heart disease of native coronary artery without angina pectoris: Secondary | ICD-10-CM

## 2021-02-04 NOTE — Progress Notes (Signed)
[address] GardensSuite 411       [name],Benjamin [name]             [phone]     HPI: Benjamin [name] returns for a scheduled follow-up visit  Benjamin [name] is a 75 year old man with a history of CAD, type 2 diabetes, hypertension, hyperlipidemia, and tobacco abuse (quit January 2022).  He presented with chest pain.  He had cardiac catheterization which showed severe three-vessel coronary disease and was referred for coronary artery bypass grafting.  He underwent coronary bypass grafting x3 on 12/26/2020.  He had some atrial fibrillation postoperatively but converted to sinus rhythm with amiodarone.  He went home on atenolol and amiodarone.  He saw Dr. Martinique about a week after discharge.  He was having a lot of nausea and gastrointestinal complaints of his amiodarone was discontinued.  He has been gradually feeling a little better.  He denies any recurrent anginal pain.  He has some incisional discomfort but is not taking any narcotics.  Past Medical History:  Diagnosis Date  . Asthma    as a child, no problems as an adult, no inhaler  . Coronary artery disease    Prior PCI to the prox LAD in 1991; S/P PCI to LCX in October 1991  . Diabetes mellitus    TYPE 2  . GERD (gastroesophageal reflux disease)    occasional - diet controlled  . History of kidney stones    many yrs ago  . Hyperlipidemia   . Tobacco abuse    quit 09/2020 - smoked for 64 yrs    Current Outpatient Medications  Medication Sig Dispense Refill  . acetaminophen (TYLENOL) 500 MG tablet Take 1,000 mg by mouth every 6 (six) hours as needed for moderate pain or headache.    Marland Kitchen aspirin EC 325 MG EC tablet Take 1 tablet (325 mg total) by mouth daily. 30 tablet 0  . atenolol (TENORMIN) 25 MG tablet Take 1 tablet (25 mg total) by mouth daily. 90 tablet 3  . atorvastatin (LIPITOR) 80 MG tablet TAKE 1 TABLET BY MOUTH DAILY (Patient taking differently: Take 80 mg by mouth daily.) 90 tablet 3  . dapagliflozin  propanediol (FARXIGA) 5 MG TABS tablet Take 1 tablet (5 mg total) by mouth daily before breakfast. 30 tablet 11  . glimepiride (AMARYL) 2 MG tablet Take 1 tablet (2 mg total) by mouth daily with breakfast. 30 tablet 11  . JANUVIA 100 MG tablet Take 100 mg by mouth daily with lunch.     . metFORMIN (GLUCOPHAGE) 1000 MG tablet Take 1 tablet (1,000 mg total) by mouth 2 (two) times daily.    . traMADol (ULTRAM) 50 MG tablet Take 1 tablet (50 mg total) by mouth every 4 (four) hours as needed for moderate pain. 30 tablet 0   No current facility-administered medications for this visit.    Physical Exam BP 124/85   Pulse (!) 135   Temp 97.6 F (36.4 C) (Skin)   Resp 20   Ht 5\' 4"  (1.626 m)   Wt 131 lb (59.4 kg)   SpO2 97% Comment: RA  BMI 22.63 kg/m  75 year old man in no acute distress Alert and oriented x3 with no focal deficits Lungs clear with equal breath sounds bilaterally Cardiac tachycardic, regular, no murmur Sternum stable, incision clean dry and intact Leg incision healing well, there is a hematoma superficially in the right calf below the incision.  No erythema  Diagnostic Tests: I personally reviewed  the chest x-ray.  It shows postoperative changes with no pleural effusions.  Impression: Benjamin [name] is a 75 year old man with a history of type 2 diabetes, hypertension, hyperlipidemia, and tobacco abuse.  He also has a longstanding history of coronary disease dating back to 42.  He presented with chest pain and was found to have three-vessel disease.  He underwent coronary bypass grafting x3 on 12/26/2020.  He had some transient atrial fibrillation postoperatively, was in sinus rhythm at the time of discharge.  His amiodarone was stopped because it was causing a lot of nausea.  Today he presented back asymptomatic but in atrial flutter with a heart rate of 135.  He is not having any chest pain or shortness of breath.  He thinks his heart is been going fast for about a week  now.  We contacted the atrial fibrillation clinic and they can see him tomorrow.  I do not think there is any indication for him to be readmitted at this point.  He is about 6 weeks out from surgery.  No restrictions on his activities, but he should build into new activities gradually.  Plan: Follow-up in atrial fibrillation clinic tomorrow morning Return in 3 weeks to check on progress.  Benjamin Nakayama, MD [facility] [phone]

## 2021-02-05 ENCOUNTER — Ambulatory Visit (HOSPITAL_COMMUNITY)
Admission: RE | Admit: 2021-02-05 | Discharge: 2021-02-05 | Disposition: A | Discharge status: Home / Self Care | Payer: Medicare Other | Source: Ambulatory Visit | Attending: Nurse Practitioner | Admitting: Nurse Practitioner

## 2021-02-05 ENCOUNTER — Encounter (HOSPITAL_COMMUNITY): Payer: Self-pay | Admitting: Nurse Practitioner

## 2021-02-05 VITALS — BP 132/66 | HR 68 | Ht 64.0 in | Wt 132.6 lb

## 2021-02-05 DIAGNOSIS — Z79899 Other long term (current) drug therapy: Secondary | ICD-10-CM | POA: Diagnosis not present

## 2021-02-05 DIAGNOSIS — D6869 Other thrombophilia: Secondary | ICD-10-CM | POA: Diagnosis not present

## 2021-02-05 DIAGNOSIS — Z7984 Long term (current) use of oral hypoglycemic drugs: Secondary | ICD-10-CM | POA: Diagnosis not present

## 2021-02-05 DIAGNOSIS — Z7982 Long term (current) use of aspirin: Secondary | ICD-10-CM | POA: Diagnosis not present

## 2021-02-05 DIAGNOSIS — I4892 Unspecified atrial flutter: Secondary | ICD-10-CM | POA: Insufficient documentation

## 2021-02-05 DIAGNOSIS — E118 Type 2 diabetes mellitus with unspecified complications: Secondary | ICD-10-CM | POA: Diagnosis not present

## 2021-02-05 DIAGNOSIS — Z951 Presence of aortocoronary bypass graft: Secondary | ICD-10-CM | POA: Insufficient documentation

## 2021-02-05 DIAGNOSIS — Z87891 Personal history of nicotine dependence: Secondary | ICD-10-CM | POA: Insufficient documentation

## 2021-02-05 DIAGNOSIS — E785 Hyperlipidemia, unspecified: Secondary | ICD-10-CM | POA: Diagnosis not present

## 2021-02-05 DIAGNOSIS — I10 Essential (primary) hypertension: Secondary | ICD-10-CM | POA: Diagnosis not present

## 2021-02-05 DIAGNOSIS — I2510 Atherosclerotic heart disease of native coronary artery without angina pectoris: Secondary | ICD-10-CM | POA: Insufficient documentation

## 2021-02-05 DIAGNOSIS — I4891 Unspecified atrial fibrillation: Secondary | ICD-10-CM | POA: Insufficient documentation

## 2021-02-05 LAB — BASIC METABOLIC PANEL
Anion gap: 7 (ref 5–15)
BUN: 12 mg/dL (ref 8–23)
CO2: 24 mmol/L (ref 22–32)
Calcium: 9.2 mg/dL (ref 8.9–10.3)
Chloride: 107 mmol/L (ref 98–111)
Creatinine, Ser: 0.71 mg/dL (ref 0.61–1.24)
GFR, Estimated: >60 mL/min (ref >60)
Glucose, Bld: 134 mg/dL — ABNORMAL HIGH (ref 70–99)
Potassium: 4.7 mmol/L (ref 3.5–5.1)
Sodium: 138 mmol/L (ref 135–145)

## 2021-02-05 LAB — CBC
HCT: 38.9 % — ABNORMAL LOW (ref 39.0–52.0)
Hemoglobin: 11.9 g/dL — ABNORMAL LOW (ref 13.0–17.0)
MCH: 26.4 pg (ref 26.0–34.0)
MCHC: 30.6 g/dL (ref 30.0–36.0)
MCV: 86.4 fL (ref 80.0–100.0)
Platelets: 184 10*3/uL (ref 150–400)
RBC: 4.5 MIL/uL (ref 4.22–5.81)
RDW: 17.7 % — ABNORMAL HIGH (ref 11.5–15.5)
WBC: 8.8 10*3/uL (ref 4.0–10.5)
nRBC: 0 % (ref 0.0–0.2)

## 2021-02-05 MED ORDER — ELIQUIS 5 MG PO TABS: 5.0000 mg | ORAL_TABLET | Freq: Two times a day (BID) | ORAL | 3 refills | Status: DC

## 2021-02-05 MED ORDER — ASPIRIN 81 MG PO TBEC: 81.0000 mg | DELAYED_RELEASE_TABLET | Freq: Every day | ORAL | Status: AC

## 2021-02-05 MED ORDER — ATENOLOL 25 MG PO TABS: ORAL_TABLET | ORAL | 3 refills | Status: DC

## 2021-02-05 NOTE — Patient Instructions (Signed)
Decrease Aspirin 81mg  once a day  Start Eliquis 5mg  twice a day  Increase atenolol to 1 tablet in the morning and 1/2 tablet in the evening.

## 2021-02-05 NOTE — Progress Notes (Addendum)
Primary Care Physician: Alroy Dust, L.Marlou Sa, MD Referring Physician: Dr. Roxan Hockey  Cardiologist: Dr. Martinique   [place] De Nurse CAD, type 2 diabetes, hypertension, hyperlipidemia, and tobacco abuse (quit January 2022).  He presented with chest pain.  He had cardiac catheterization which showed severe three-vessel coronary disease and was referred for coronary artery bypass grafting.  He underwent coronary bypass grafting  X 3 on 12/26/2020.  He had some atrial fibrillation postoperatively but converted to sinus rhythm with amiodarone.  He went home on   amiodarone.  He saw Dr. Martinique about a week after discharge.  He was having a lot of nausea and gastrointestinal complaints and his amiodarone was discontinued.  He saw Dr. Roxan Hockey yesterday for f/u and was found to be in  atrial flutter around 150 bpm. Pt. was unaware. He was referred to afib clinic. He is in  SR today in the 60's. He is feeling slowy better form his bypass surgery.  Today, he denies symptoms of palpitations, chest pain, shortness of breath, orthopnea, PND, lower extremity edema, dizziness, presyncope, syncope, or neurologic sequela. The patient is tolerating medications without difficulties and is otherwise without complaint today.   Past Medical History:  Diagnosis Date  . Asthma    as a child, no problems as an adult, no inhaler  . Coronary artery disease    Prior PCI to the prox LAD in 1991; S/P PCI to LCX in October 1991  . Diabetes mellitus    TYPE 2  . GERD (gastroesophageal reflux disease)    occasional - diet controlled  . History of kidney stones    many yrs ago  . Hyperlipidemia   . Tobacco abuse    quit 09/2020 - smoked for 64 yrs   Past Surgical History:  Procedure Laterality Date  . COLONOSCOPY WITH PROPOFOL N/A 04/21/2016   Procedure: COLONOSCOPY WITH PROPOFOL;  Surgeon: Garlan Fair, MD;  Location: [facility];  Service: Endoscopy;  Laterality: N/A;  . CORONARY ANGIOPLASTY  05/1990    PROXIMAL LAD  . CORONARY ANGIOPLASTY  06/1990   LEFT CIRCUMFLEX CORONARY  . CORONARY ARTERY BYPASS GRAFT N/A 12/26/2020   Procedure: CORONARY ARTERY BYPASS GRAFTING (CABG), ON PUMP, TIMES THREE, USING LEFT INTERNAL MAMMARY ARTERY AND ENDOSCOPICALLY HARVESTED RIGHT GREATER SAPHENOUS VEIN;  Surgeon: Melrose Nakayama, MD;  Location: [facility];  Service: Open Heart Surgery;  Laterality: N/A;  . KIDNEY STONE SURGERY Bilateral    x 2 surgeries  . LEFT HEART CATH AND CORONARY ANGIOGRAPHY N/A 11/18/2018   Procedure: LEFT HEART CATH AND CORONARY ANGIOGRAPHY;  Surgeon: Martinique, Peter M, MD;  Location: [facility];  Service: Cardiovascular;  Laterality: N/A;  . LEFT HEART CATH AND CORONARY ANGIOGRAPHY N/A 12/17/2020   Procedure: LEFT HEART CATH AND CORONARY ANGIOGRAPHY;  Surgeon: Martinique, Peter M, MD;  Location: [facility];  Service: Cardiovascular;  Laterality: N/A;  . TEE WITHOUT CARDIOVERSION N/A 12/26/2020   Procedure: TRANSESOPHAGEAL ECHOCARDIOGRAM (TEE);  Surgeon: Melrose Nakayama, MD;  Location: [facility];  Service: Open Heart Surgery;  Laterality: N/A;  . UMBILICAL HERNIA REPAIR      Current Outpatient Medications  Medication Sig Dispense Refill  . acetaminophen (TYLENOL) 500 MG tablet Take 1,000 mg by mouth every 6 (six) hours as needed for moderate pain or headache.    Marland Kitchen aspirin EC 325 MG EC tablet Take 1 tablet (325 mg total) by mouth daily. 30 tablet 0  . atenolol (TENORMIN) 25 MG tablet Take 1 tablet (25 mg  total) by mouth daily. 90 tablet 3  . atorvastatin (LIPITOR) 80 MG tablet TAKE 1 TABLET BY MOUTH DAILY 90 tablet 3  . bisacodyl (DULCOLAX) 5 MG EC tablet Take 5 mg by mouth daily as needed for moderate constipation or mild constipation.    . dapagliflozin propanediol (FARXIGA) 5 MG TABS tablet Take 1 tablet (5 mg total) by mouth daily before breakfast. 30 tablet 11  . glimepiride (AMARYL) 2 MG tablet Take 1 tablet (2 mg total) by mouth daily with breakfast. 30 tablet 11  .  JANUVIA 100 MG tablet Take 100 mg by mouth daily with lunch.     . Lancets (ONETOUCH DELICA PLUS [id number]) MISC Apply 1 each topically daily.    . metFORMIN (GLUCOPHAGE) 1000 MG tablet Take 1 tablet (1,000 mg total) by mouth 2 (two) times daily.     No current facility-administered medications for this encounter.    Allergies  Allergen Reactions  . Sulfa Drugs Cross Reactors Rash    Social History   Socioeconomic History  . Marital status: Married    Spouse name: Not on file  . Number of children: 2  . Years of education: Not on file  . Highest education level: Not on file  Occupational History  . Occupation: [employer]    Comment: retired  Tobacco Use  . Smoking status: Former Smoker    Packs/day: 1.00    Years: 64.00    Pack years: 64.00    Types: Cigarettes    Quit date: 09/28/2020    Years since quitting: 0.3  . Smokeless tobacco: Never Used  Vaping Use  . Vaping Use: Never used  Substance and Sexual Activity  . Alcohol use: Yes    Alcohol/week: 3.0 - 4.0 standard drinks    Types: 3 - 4 Standard drinks or equivalent per week    Comment: every other weekend - liquor  . Drug use: No  . Sexual activity: Yes  Other Topics Concern  . Not on file  Social History Narrative  . Not on file   Social Determinants of Health   Financial Resource Strain: Not on file  Food Insecurity: Not on file  Transportation Needs: Not on file  Physical Activity: Not on file  Stress: Not on file  Social Connections: Not on file  Intimate Partner Violence: Not on file    Family History  Problem Relation Age of Onset  . Heart disease Sister        had open heart surgery    ROS- All systems are reviewed and negative except as per the HPI above  Physical Exam: There were no vitals filed for this visit. Wt Readings from Last 3 Encounters:  02/04/21 59.4 kg  01/08/21 60.4 kg  01/02/21 60 kg    Labs: Lab Results  Component Value Date   NA 137 12/31/2020   K 4.5  12/31/2020   CL 103 12/31/2020   CO2 26 12/31/2020   GLUCOSE 120 (H) 12/31/2020   BUN 22 12/31/2020   CREATININE 0.99 12/31/2020   CALCIUM 8.7 (L) 12/31/2020   MG 1.9 12/27/2020   Lab Results  Component Value Date   INR 1.6 (H) 12/26/2020   Lab Results  Component Value Date   CHOL 129 12/13/2020   HDL 64 12/13/2020   LDLCALC 49 12/13/2020   TRIG 80 12/13/2020     GEN- The patient is well appearing, alert and oriented x 3 today.   Head- normocephalic, atraumatic Eyes-  Sclera clear, conjunctiva  pink Ears- hearing intact Oropharynx- clear Neck- supple, no JVP Lymph- no cervical lymphadenopathy Lungs- Clear to ausculation bilaterally, normal work of breathing Heart- Regular rate and rhythm, no murmurs, rubs or gallops, PMI not laterally displaced GI- soft, NT, ND, + BS Extremities- no clubbing, cyanosis, or edema MS- no significant deformity or atrophy Skin- no rash or lesion Psych- euthymic mood, full affect Neuro- strength and sensation are intact  EKG- Sinus  rhythm with a pac noted. 68 bpm, pr int 208 ms, qrs int 126 ms, qtc 476 ms  Epic records reviewed    Assessment and Plan: 1. afib  post op CABG General education re afib and flutter  Was in SR at d/c Amiodarone stopped shortly after discharge due to intolerance/gi issues Appeared to be in atrial flutter at Dr. Leonarda Salon office yesterday, pt unaware, today in [place]  Will increase atenolol to 25 mg am and to 1/2 tab pm  Advised to use his pulse ox to try to track arrhythmia at home  2. CHA2DS2VASc score of 4  Pt denies a bleeding history He reduced his ASA to 81 mg yesterday per Dr. Roxan Hockey  Discussed to lower  his stroke risk to start eliquis 5 mg bid I will ask Dr. Martinique if he wants to continue baby asa Free 30 day card given for eliquis  Bleeding precautions given  Bmet/cbc today   3. CABG x3  Slowly improving  Per Dr. Roxan Hockey    F/u here in one week   Butch Penny C. Lelend Heinecke, [facility] [address] [place], [place] [address] [phone]

## 2021-02-12 ENCOUNTER — Encounter (HOSPITAL_COMMUNITY): Payer: Self-pay | Admitting: Nurse Practitioner

## 2021-02-12 ENCOUNTER — Ambulatory Visit (HOSPITAL_COMMUNITY)
Admission: RE | Admit: 2021-02-12 | Discharge: 2021-02-12 | Disposition: A | Discharge status: Home / Self Care | Payer: Medicare Other | Source: Ambulatory Visit | Attending: Nurse Practitioner | Admitting: Nurse Practitioner

## 2021-02-12 ENCOUNTER — Other Ambulatory Visit: Payer: Self-pay

## 2021-02-12 VITALS — BP 110/60 | HR 56 | Ht 64.0 in | Wt 136.2 lb

## 2021-02-12 DIAGNOSIS — Z7982 Long term (current) use of aspirin: Secondary | ICD-10-CM | POA: Insufficient documentation

## 2021-02-12 DIAGNOSIS — Z7901 Long term (current) use of anticoagulants: Secondary | ICD-10-CM | POA: Diagnosis not present

## 2021-02-12 DIAGNOSIS — D6869 Other thrombophilia: Secondary | ICD-10-CM

## 2021-02-12 DIAGNOSIS — E785 Hyperlipidemia, unspecified: Secondary | ICD-10-CM | POA: Insufficient documentation

## 2021-02-12 DIAGNOSIS — Z79899 Other long term (current) drug therapy: Secondary | ICD-10-CM | POA: Diagnosis not present

## 2021-02-12 DIAGNOSIS — R079 Chest pain, unspecified: Secondary | ICD-10-CM | POA: Diagnosis not present

## 2021-02-12 DIAGNOSIS — I10 Essential (primary) hypertension: Secondary | ICD-10-CM | POA: Insufficient documentation

## 2021-02-12 DIAGNOSIS — Z7984 Long term (current) use of oral hypoglycemic drugs: Secondary | ICD-10-CM | POA: Insufficient documentation

## 2021-02-12 DIAGNOSIS — I2510 Atherosclerotic heart disease of native coronary artery without angina pectoris: Secondary | ICD-10-CM | POA: Diagnosis not present

## 2021-02-12 DIAGNOSIS — Z951 Presence of aortocoronary bypass graft: Secondary | ICD-10-CM | POA: Insufficient documentation

## 2021-02-12 DIAGNOSIS — Z87891 Personal history of nicotine dependence: Secondary | ICD-10-CM | POA: Insufficient documentation

## 2021-02-12 DIAGNOSIS — I480 Paroxysmal atrial fibrillation: Secondary | ICD-10-CM | POA: Insufficient documentation

## 2021-02-12 NOTE — Progress Notes (Signed)
Primary Care Physician: Alroy Dust, L.Marlou Sa, MD Referring Physician: Dr. Roxan Hockey  Cardiologist: Dr. Martinique   [place] De Nurse CAD, type 2 diabetes, hypertension, hyperlipidemia, and tobacco abuse (quit January 2022).  He presented with chest pain.  He had cardiac catheterization which showed severe three-vessel coronary disease and was referred for coronary artery bypass grafting.  He underwent coronary bypass grafting  X 3 on 12/26/2020.  He had some atrial fibrillation postoperatively but converted to sinus rhythm with amiodarone.  He went home on   amiodarone.  He saw Dr. Martinique about a week after discharge.  He was having a lot of nausea and gastrointestinal complaints and his amiodarone was discontinued.  He saw Dr. Roxan Hockey yesterday for f/u and was found to be in  atrial flutter around 150 bpm. Pt. was unaware. He was referred to afib clinic. He is in  SR today in the 60's. He is feeling slowy better form his bypass surgery.  F/u in afib clinic, 5/18. He feels improved from last week. He has not been aware of any  irregular heart beat but he was unaware of when he was in [place] in Dr. Leonarda Salon office. He is doing well with eliquis and increase of anteolol. I will place a 2 week Zio patch on pt to assess afib burden.    Today, he denies symptoms of palpitations, chest pain, shortness of breath, orthopnea, PND, lower extremity edema, dizziness, presyncope, syncope, or neurologic sequela. The patient is tolerating medications without difficulties and is otherwise without complaint today.   Past Medical History:  Diagnosis Date  . Asthma    as a child, no problems as an adult, no inhaler  . Coronary artery disease    Prior PCI to the prox LAD in 1991; S/P PCI to LCX in October 1991  . Diabetes mellitus    TYPE 2  . GERD (gastroesophageal reflux disease)    occasional - diet controlled  . History of kidney stones    many yrs ago  . Hyperlipidemia   . Tobacco abuse    quit  09/2020 - smoked for 64 yrs   Past Surgical History:  Procedure Laterality Date  . COLONOSCOPY WITH PROPOFOL N/A 04/21/2016   Procedure: COLONOSCOPY WITH PROPOFOL;  Surgeon: Garlan Fair, MD;  [facility]: [facility];  Service: Endoscopy;  Laterality: N/A;  . CORONARY ANGIOPLASTY  05/1990   PROXIMAL LAD  . CORONARY ANGIOPLASTY  06/1990   LEFT CIRCUMFLEX CORONARY  . CORONARY ARTERY BYPASS GRAFT N/A 12/26/2020   Procedure: CORONARY ARTERY BYPASS GRAFTING (CABG), ON PUMP, TIMES THREE, USING LEFT INTERNAL MAMMARY ARTERY AND ENDOSCOPICALLY HARVESTED RIGHT GREATER SAPHENOUS VEIN;  Surgeon: Melrose Nakayama, MD;  Location: [facility];  Service: Open Heart Surgery;  Laterality: N/A;  . KIDNEY STONE SURGERY Bilateral    x 2 surgeries  . LEFT HEART CATH AND CORONARY ANGIOGRAPHY N/A 11/18/2018   Procedure: LEFT HEART CATH AND CORONARY ANGIOGRAPHY;  Surgeon: Martinique, Peter M, MD;  Location: [facility];  Service: Cardiovascular;  Laterality: N/A;  . LEFT HEART CATH AND CORONARY ANGIOGRAPHY N/A 12/17/2020   Procedure: LEFT HEART CATH AND CORONARY ANGIOGRAPHY;  Surgeon: Martinique, Peter M, MD;  Location: [facility];  Service: Cardiovascular;  Laterality: N/A;  . TEE WITHOUT CARDIOVERSION N/A 12/26/2020   Procedure: TRANSESOPHAGEAL ECHOCARDIOGRAM (TEE);  Surgeon: Melrose Nakayama, MD;  Location: [facility];  Service: Open Heart Surgery;  Laterality: N/A;  . UMBILICAL HERNIA REPAIR      Current Outpatient  Medications  Medication Sig Dispense Refill  . acetaminophen (TYLENOL) 500 MG tablet Take 1,000 mg by mouth every 6 (six) hours as needed for moderate pain or headache.    Marland Kitchen apixaban (ELIQUIS) 5 MG TABS tablet Take 1 tablet (5 mg total) by mouth 2 (two) times daily. 60 tablet 3  . aspirin 81 MG EC tablet Take 1 tablet (81 mg total) by mouth daily.    Marland Kitchen atenolol (TENORMIN) 25 MG tablet Take 1 tablet in the AM and 1/2 tablet in the PM 90 tablet 3  . atorvastatin (LIPITOR) 80 MG tablet TAKE 1  TABLET BY MOUTH DAILY 90 tablet 3  . bisacodyl (DULCOLAX) 5 MG EC tablet Take 5 mg by mouth daily as needed for moderate constipation or mild constipation.    . dapagliflozin propanediol (FARXIGA) 5 MG TABS tablet Take 1 tablet (5 mg total) by mouth daily before breakfast. 30 tablet 11  . glimepiride (AMARYL) 2 MG tablet Take 1 tablet (2 mg total) by mouth daily with breakfast. 30 tablet 11  . JANUVIA 100 MG tablet Take 100 mg by mouth daily with lunch.     . Lancets (ONETOUCH DELICA PLUS [id number]) MISC Apply 1 each topically daily.    . metFORMIN (GLUCOPHAGE) 1000 MG tablet Take 1 tablet (1,000 mg total) by mouth 2 (two) times daily.     No current facility-administered medications for this encounter.    Allergies  Allergen Reactions  . Sulfa Drugs Cross Reactors Rash    Social History   Socioeconomic History  . Marital status: Married    Spouse name: Not on file  . Number of children: 2  . Years of education: Not on file  . Highest education level: Not on file  Occupational History  . Occupation: [employer]    Comment: retired  Tobacco Use  . Smoking status: Former Smoker    Packs/day: 1.00    Years: 64.00    Pack years: 64.00    Types: Cigarettes    Quit date: 09/28/2020    Years since quitting: 0.3  . Smokeless tobacco: Never Used  Vaping Use  . Vaping Use: Never used  Substance and Sexual Activity  . Alcohol use: Yes    Alcohol/week: 3.0 - 4.0 standard drinks    Types: 3 - 4 Standard drinks or equivalent per week    Comment: every other weekend - liquor  . Drug use: No  . Sexual activity: Yes  Other Topics Concern  . Not on file  Social History Narrative  . Not on file   Social Determinants of Health   Financial Resource Strain: Not on file  Food Insecurity: Not on file  Transportation Needs: Not on file  Physical Activity: Not on file  Stress: Not on file  Social Connections: Not on file  Intimate Partner Violence: Not on file    Family  History  Problem Relation Age of Onset  . Heart disease Sister        had open heart surgery    ROS- All systems are reviewed and negative except as per the HPI above  Physical Exam: Vitals:   02/12/21 1434  BP: 110/60  Pulse: (!) 56  Weight: 61.8 kg  Height: 5\' 4"  (1.626 m)   Wt Readings from Last 3 Encounters:  02/12/21 61.8 kg  02/05/21 60.1 kg  02/04/21 59.4 kg    Labs: Lab Results  Component Value Date   NA 138 02/05/2021   K 4.7 02/05/2021  CL 107 02/05/2021   CO2 24 02/05/2021   GLUCOSE 134 (H) 02/05/2021   BUN 12 02/05/2021   CREATININE 0.71 02/05/2021   CALCIUM 9.2 02/05/2021   MG 1.9 12/27/2020   Lab Results  Component Value Date   INR 1.6 (H) 12/26/2020   Lab Results  Component Value Date   CHOL 129 12/13/2020   HDL 64 12/13/2020   LDLCALC 49 12/13/2020   TRIG 80 12/13/2020     GEN- The patient is well appearing, alert and oriented x 3 today.   Head- normocephalic, atraumatic Eyes-  Sclera clear, conjunctiva pink Ears- hearing intact Oropharynx- clear Neck- supple, no JVP Lymph- no cervical lymphadenopathy Lungs- Clear to ausculation bilaterally, normal work of breathing Heart- Regular rate and rhythm, no murmurs, rubs or gallops, PMI not laterally displaced GI- soft, NT, ND, + BS Extremities- no clubbing, cyanosis, or edema MS- no significant deformity or atrophy Skin- no rash or lesion Psych- euthymic mood, full affect Neuro- strength and sensation are intact  EKG- Sinus  Bradycardia at 56 bpm pr int 200 ms, qrs int 120  ms, qtc 455 ms  Epic records reviewed    Assessment and Plan: 1. afib  post op CABG In  SR today  Amiodarone stopped shortly after discharge due to intolerance/gi issues Appeared to be in atrial flutter at Dr. Leonarda Salon office  At f/u  Continue atenolol  25 mg am and  1/2 tab pm  Will  place a 2 week Zio patch to determine afib burden   2. CHA2DS2VASc score of 4  Continue asa at 81 mg daily Continue   eliquis 5 mg bid   3. CABG x3  Improving, "feeling better every day"  Per Dr. Roxan Hockey   F/u with Dr.  Roxan Hockey 5/31, Dr. Martinique 7/7  Will call with results of monitor when available and any f/u if needed   Butch Penny C. Waylynn Benefiel, [facility] [address] [place],  [address] [phone]

## 2021-02-25 ENCOUNTER — Ambulatory Visit (INDEPENDENT_AMBULATORY_CARE_PROVIDER_SITE_OTHER): Payer: Self-pay | Admitting: Thoracic Surgery (Cardiothoracic Vascular Surgery)

## 2021-02-25 ENCOUNTER — Encounter: Payer: Self-pay | Admitting: Thoracic Surgery (Cardiothoracic Vascular Surgery)

## 2021-02-25 ENCOUNTER — Other Ambulatory Visit: Payer: Self-pay

## 2021-02-25 VITALS — BP 118/64 | HR 72 | Resp 20 | Ht 64.0 in | Wt 141.4 lb

## 2021-02-25 DIAGNOSIS — Z951 Presence of aortocoronary bypass graft: Secondary | ICD-10-CM

## 2021-02-25 NOTE — Progress Notes (Signed)
[address]       ,[place] [address]             [phone]     HPI: Benjamin [name] returns for scheduled postoperative follow-up visit  Benjamin [name] is a 75 year old man with a history of coronary disease, type 2 diabetes, hypertension, hyperlipidemia, and tobacco abuse.  He presented with chest pain.  Cardiac catheterization showed three-vessel disease.  He had coronary bypass grafting x3 on 12/26/2020.  Postoperatively he had A. fib.  He was treated with amiodarone and went home in sinus rhythm.  His amiodarone was discontinued due to gastrointestinal side effects.  I saw him in the office on 02/04/2021 he was in a flutter with a heart rate in the 135-150 range.  He was asymptomatic from that.  He went to the A. fib clinic the next day.  He was in sinus rhythm.  He was started on Eliquis.  In the interim since his last visit he has been feeling well.  He has a little bit of discomfort and swelling in his right leg related to the vein harvest.  The discomfort is in the area of the incision right below the knee.  He does notice some pain in his sternal incision when he takes a deep breath but is not taking any medication for that.  Past Medical History:  Diagnosis Date  . Asthma    as a child, no problems as an adult, no inhaler  . Coronary artery disease    Prior PCI to the prox LAD in 1991; S/P PCI to LCX in October 1991  . Diabetes mellitus    TYPE 2  . GERD (gastroesophageal reflux disease)    occasional - diet controlled  . History of kidney stones    many yrs ago  . Hyperlipidemia   . Tobacco abuse    quit 09/2020 - smoked for 64 yrs    Current Outpatient Medications  Medication Sig Dispense Refill  . acetaminophen (TYLENOL) 500 MG tablet Take 1,000 mg by mouth every 6 (six) hours as needed for moderate pain or headache.    Marland Kitchen apixaban (ELIQUIS) 5 MG TABS tablet Take 1 tablet (5 mg total) by mouth 2 (two) times daily. 60 tablet 3  . aspirin 81 MG EC  tablet Take 1 tablet (81 mg total) by mouth daily.    Marland Kitchen atenolol (TENORMIN) 25 MG tablet Take 1 tablet in the AM and 1/2 tablet in the PM 90 tablet 3  . atorvastatin (LIPITOR) 80 MG tablet TAKE 1 TABLET BY MOUTH DAILY 90 tablet 3  . bisacodyl (DULCOLAX) 5 MG EC tablet Take 5 mg by mouth daily as needed for moderate constipation or mild constipation.    . dapagliflozin propanediol (FARXIGA) 5 MG TABS tablet Take 1 tablet (5 mg total) by mouth daily before breakfast. 30 tablet 11  . glimepiride (AMARYL) 2 MG tablet Take 1 tablet (2 mg total) by mouth daily with breakfast. 30 tablet 11  . JANUVIA 100 MG tablet Take 100 mg by mouth daily with lunch.     . Lancets (ONETOUCH DELICA PLUS [id number]) MISC Apply 1 each topically daily.    . metFORMIN (GLUCOPHAGE) 1000 MG tablet Take 1 tablet (1,000 mg total) by mouth 2 (two) times daily.     No current facility-administered medications for this visit.    Physical Exam BP 118/64 (BP Location: Right Arm, Patient Position: Sitting, Cuff Size: Normal)   Pulse 72  Resp 20   Ht 5\' 4"  (1.626 m)   Wt 141 lb 6.4 oz (64.1 kg)   SpO2 93% Comment: RA  BMI 24.19 kg/m  75 year old man in no acute distress Alert and oriented x3 with no focal deficits Sternal incision clean dry and intact, sternum stable Cardiac regular rate and rhythm Lungs clear with equal breath sounds bilaterally Leg incisions well-healed, seroma below leg incision with mild tenderness, no erythema.  Diagnostic Tests: Chest x-ray on 02/04/2021 showed postoperative changes with no effusions  Impression: Benjamin [name] is a 75 year old man with a history of coronary disease, type 2 diabetes, hypertension, hyperlipidemia, and tobacco abuse.  He underwent coronary bypass grafting x3 on 12/26/2020.  Postoperative course was complicated by atrial fibrillation.  That converted to sinus rhythm prior to discharge but then his first follow-up visit he was in atrial flutter.  He went to the atrial  fibrillation clinic the following day and was back in sinus rhythm.    Status post CABG-now 2 months out from surgery.  There are no restrictions on his activities.  He is already been driving.  Postoperative atrial fibrillation/flutter-in sinus rhythm today.  He was started on Eliquis.  He has a cardiac monitor in place which is supposed to come off tomorrow.  He will follow-up with Dr. Martinique in the atrial fibrillation clinic  Plan: I will be happy to see Benjamin [name] back anytime in the future if I can be of any further assistance with his care  Melrose Nakayama, MD [facility] [phone]

## 2021-03-03 DIAGNOSIS — I480 Paroxysmal atrial fibrillation: Secondary | ICD-10-CM | POA: Diagnosis not present

## 2021-03-06 ENCOUNTER — Telehealth (HOSPITAL_COMMUNITY): Payer: Self-pay

## 2021-03-06 NOTE — Telephone Encounter (Signed)
Called pt to see if he was interested in the cardiac rehab program, pt stated that he was not interested at this time. Closed referral

## 2021-03-07 ENCOUNTER — Telehealth (HOSPITAL_COMMUNITY): Payer: Self-pay | Admitting: *Deleted

## 2021-03-07 ENCOUNTER — Encounter (HOSPITAL_COMMUNITY): Payer: Self-pay

## 2021-03-07 NOTE — Telephone Encounter (Signed)
-----   Message from Sherran Needs, NP sent at 03/05/2021  3:33 PM EDT ----- Please let pt know that monitor did not show any afib. Short runs of increased HR, longest 36 sec.some skipped beats.

## 2021-03-07 NOTE — Telephone Encounter (Signed)
Pt.notified

## 2021-03-29 NOTE — Progress Notes (Signed)
**Note [name]-Identified via Obfuscation** Benjamin [name] Nurse Date of Birth: [birth year] Medical Record #[id number]  History of Present Illness: Benjamin [name] is seen as a work in for evaluation of chest pain. He has a history of coronary disease with prior angioplasty of the proximal LAD in 1991. He also had angioplasty of the circumflex at that time. He has known occlusion of the right coronary with collateral flow. Last Myoview study in October 2015 showed a small anterolateral scar. No ischemia.    On a prior  visit he described 3 episodes of chest pain for which he took sl Ntg. Lasted 5-10 minutes. No clear triggers. One time he became diaphoretic.  He denies dyspnea or palpitation.  He had a Myoview study showing anterior ischemia. This led to a cardiac cath in February 2020 showing occluded RCA and moderate nonobstructive disease in the LAD and LCx. Unchanged from prior study. Medical therapy recommended.   He was on maximal antianginal therapy with  aspirin, atenolol, and isosorbide, Ranexa. He presented in March with  unstable angina. Cardiac cath showed progressive multivessel CAD. He subsequently underwent CABG by Dr Roxan Hockey with LIMA to the LAD, SVG to diagonal and SVG to OM. It was noted that the RCA branches were too small to graft. His post op course was fairly uneventful. He had Afib on post op day #3 but converted on amiodarone. He had post op anemia. When I saw him last we discontinued amiodarone due to Nausea and poor appetite. When seen by Dr Roxan Hockey on May 10 he was in Atrial flutter with rate 150. He was asymptomatic. Was seen in the Afib clinic and was back in NSR. Atenolol dose was increased. Event monitor was placed showing a single episode of SVT lasting 36 seconds.   On follow up today he reports he was very depressed after his surgery but this has resolved. He is walking some. Eating a lot of sweets/candy. Sometimes sugar up to 200. He has not resumed smoking. Denies any palpitations. No dyspnea or chest  pain.    Allergies as of 04/03/2021       Reactions   Sulfa Drugs Cross Reactors Rash        Medication List        Accurate as of April 03, 2021 10:49 AM. If you have any questions, ask your nurse or doctor.          acetaminophen 500 MG tablet Commonly known as: TYLENOL Take 1,000 mg by mouth every 6 (six) hours as needed for moderate pain or headache.   aspirin 81 MG EC tablet Take 1 tablet (81 mg total) by mouth daily.   atenolol 25 MG tablet Commonly known as: TENORMIN Take 1 tablet in the AM and 1/2 tablet in the PM   atorvastatin 80 MG tablet Commonly known as: LIPITOR TAKE 1 TABLET BY MOUTH DAILY   bisacodyl 5 MG EC tablet Commonly known as: DULCOLAX Take 5 mg by mouth daily as needed for moderate constipation or mild constipation.   dapagliflozin propanediol 5 MG Tabs tablet Commonly known as: Farxiga Take 1 tablet (5 mg total) by mouth daily before breakfast.   Eliquis 5 MG Tabs tablet Generic drug: apixaban Take 1 tablet (5 mg total) by mouth 2 (two) times daily.   glimepiride 2 MG tablet Commonly known as: Amaryl Take 1 tablet (2 mg total) by mouth daily with breakfast.   Januvia 100 MG tablet Generic drug: sitaGLIPtin Take 100 mg by mouth daily with lunch.   metFORMIN 1000  MG tablet Commonly known as: GLUCOPHAGE Take 1 tablet (1,000 mg total) by mouth 2 (two) times daily.   OneTouch Delica Plus [id number] Misc Apply 1 each topically daily.         Allergies  Allergen Reactions   Sulfa Drugs Cross Reactors Rash    Past Medical History:  Diagnosis Date   Asthma    as a child, no problems as an adult, no inhaler   Coronary artery disease    Prior PCI to the prox LAD in 1991; S/P PCI to LCX in October 1991   Diabetes mellitus    TYPE 2   GERD (gastroesophageal reflux disease)    occasional - diet controlled   History of kidney stones    many yrs ago   Hyperlipidemia    Tobacco abuse    quit 09/2020 - smoked for 64 yrs     Past Surgical History:  Procedure Laterality Date   COLONOSCOPY WITH PROPOFOL N/A 04/21/2016   Procedure: COLONOSCOPY WITH PROPOFOL;  Surgeon: Garlan Fair, MD;  Location: [facility];  Service: Endoscopy;  Laterality: N/A;   CORONARY ANGIOPLASTY  05/1990   PROXIMAL LAD   CORONARY ANGIOPLASTY  06/1990   LEFT CIRCUMFLEX CORONARY   CORONARY ARTERY BYPASS GRAFT N/A 12/26/2020   Procedure: CORONARY ARTERY BYPASS GRAFTING (CABG), ON PUMP, TIMES THREE, USING LEFT INTERNAL MAMMARY ARTERY AND ENDOSCOPICALLY HARVESTED RIGHT GREATER SAPHENOUS VEIN;  Surgeon: Melrose Nakayama, MD;  Location: [facility];  Service: Open Heart Surgery;  Laterality: N/A;   KIDNEY STONE SURGERY Bilateral    x 2 surgeries   LEFT HEART CATH AND CORONARY ANGIOGRAPHY N/A 11/18/2018   Procedure: LEFT HEART CATH AND CORONARY ANGIOGRAPHY;  Surgeon: Martinique, Gery Sabedra M, MD;  Location: [facility];  Service: Cardiovascular;  Laterality: N/A;   LEFT HEART CATH AND CORONARY ANGIOGRAPHY N/A 12/17/2020   Procedure: LEFT HEART CATH AND CORONARY ANGIOGRAPHY;  Surgeon: Martinique, Jahnya Trindade M, MD;  Location: [facility];  Service: Cardiovascular;  Laterality: N/A;   TEE WITHOUT CARDIOVERSION N/A 12/26/2020   Procedure: TRANSESOPHAGEAL ECHOCARDIOGRAM (TEE);  Surgeon: Melrose Nakayama, MD;  Location: [facility];  Service: Open Heart Surgery;  Laterality: N/A;   UMBILICAL HERNIA REPAIR      Social History   Tobacco Use  Smoking Status Former   Packs/day: 1.00   Years: 64.00   Pack years: 64.00   Types: Cigarettes   Quit date: 09/28/2020   Years since quitting: 0.5  Smokeless Tobacco Never    Social History   Substance and Sexual Activity  Alcohol Use Yes   Alcohol/week: 3.0 - 4.0 standard drinks   Types: 3 - 4 Standard drinks or equivalent per week   Comment: every other weekend - liquor    Family History  Problem Relation Age of Onset   Heart disease Sister        had open heart surgery    Review of Systems: As  noted in history of present illness.  All other systems were reviewed and are negative.  Physical Exam: BP (!) 106/58 (BP Location: Left Arm, Patient Position: Sitting, Cuff Size: Normal)   Pulse 66   Ht 5\' 4"  (1.626 m)   Wt 142 lb 3.2 oz (64.5 kg)   SpO2 95%   BMI 24.41 kg/m  GENERAL:  Well appearing WM in NAD HEENT:  PERRL, EOMI, sclera are clear. Oropharynx is clear. NECK:  No jugular venous distention, carotid upstroke brisk and symmetric, no bruits, no thyromegaly  or adenopathy LUNGS:  Clear to auscultation bilaterally CHEST:  Incisions healing well.  HEART:  RRR,  PMI not displaced or sustained,S1 and S2 within normal limits, no S3, no S4: no clicks, no rubs, no murmurs ABD:  Soft, nontender. BS +, no masses or bruits. No hepatomegaly, no splenomegaly EXT:  2 + pulses throughout, no edema, no cyanosis no clubbing.  SKIN:  Warm and dry.  No rashes NEURO:  Alert and oriented x 3. Cranial nerves II through XII intact. PSYCH:  Cognitively intact   LABORATORY DATA:  Lab Results  Component Value Date   WBC 8.8 02/05/2021   HGB 11.9 (L) 02/05/2021   HCT 38.9 (L) 02/05/2021   PLT 184 02/05/2021   GLUCOSE 134 (H) 02/05/2021   CHOL 129 12/13/2020   TRIG 80 12/13/2020   HDL 64 12/13/2020   LDLCALC 49 12/13/2020   ALT 47 (H) 12/25/2020   AST 32 12/25/2020   NA 138 02/05/2021   K 4.7 02/05/2021   CL 107 02/05/2021   CREATININE 0.71 02/05/2021   BUN 12 02/05/2021   CO2 24 02/05/2021   INR 1.6 (H) 12/26/2020   HGBA1C 8.5 (H) 12/25/2020   Labs dated 08/05/16: cholesterol 115, triglycerides 56, LDL 49, HDL 56. A1c 7.4%. CMET normal. Dated 08/30/17: cholesterol 120, triglycerides 58, HDL 46, LDL 62. A1c 7.8%. Chemistries and CBC normal.  Dated 09/16/18: cholesterol 106, triglycerides 51, HDL 48, LDL48. A1c 7.4%. CMET normal Dated 03/27/19: A1c 7.5%. BMET normal Dated 10/19/19: cholesterol 119, triglycerides 52, HDL 51, LDL 56, CMET normal Dated 04/10/20: A1c 6.8%.  Dated 10/10/20:  cholesterol 123, triglycerides 56, HDL 55, LDL 56. A1c 7.1%. CMET normal.   Ecg today shows NSR rate 78 with nonspecific ST depression. QT is prolonged.  I have personally reviewed and interpreted this study.  Myoview 11/09/18: Study Highlights     The left ventricular ejection fraction is normal (55-65%). Nuclear stress EF: 59%. Blood pressure demonstrated a normal response to exercise. There was no ST segment deviation noted during stress. No T wave inversion was noted during stress. Defect 1: There is a small defect of moderate severity present in the mid anterolateral, apical lateral and apex location. Findings consistent with ischemia. This is an intermediate risk study.   The anterolateral perfusion defect on the prior exam was interpreted as fixed, however on today's exam appears to have reversible ischemia. Wall motion appears abnormal in this region.     Cardiac cath 11/18/18:  LEFT HEART CATH AND CORONARY ANGIOGRAPHY  Conclusion     Prox LAD lesion is 45% stenosed. Prox LAD to Mid LAD lesion is 65% stenosed. Ost Cx to Prox Cx lesion is 50% stenosed. Prox Cx to Mid Cx lesion is 50% stenosed. Prox RCA to Dist RCA lesion is 100% stenosed. The left ventricular systolic function is normal. LV end diastolic pressure is normal. The left ventricular ejection fraction is 55-65% by visual estimate.   1. Single vessel occlusive CAD with CTO of the RCA. Left to right collaterals. There is diffuse moderate calcific disease involving the LAD and LCx that is not significantly changed since 2011. 2. Normal LV function 3. Normal LV EDP   Plan: continued medical therapy   Cardiac cath 12/17/20:  LEFT HEART CATH AND CORONARY ANGIOGRAPHY    Conclusion    Prox LAD lesion is 45% stenosed. Prox LAD to Mid LAD lesion is 75% stenosed. Ost Cx to Prox Cx lesion is 90% stenosed. Prox Cx to Mid Cx lesion is  60% stenosed. Prox RCA to Dist RCA lesion is 100% stenosed. Ost LM to Mid LM  lesion is 30% stenosed. Ost LAD lesion is 80% stenosed. The left ventricular systolic function is normal. LV end diastolic pressure is normal. The left ventricular ejection fraction is 55-65% by visual estimate.   1. Severe 3 vessel obstructive CAD. Patient has CTO of the RCA. Progressive ostial LAD and LCX disease. Severely calcified vessels 2. Normal LV function 3. Normal LVEDP   Plan: recommend referral to CT surgery for consideration of CABG.    Coronary Diagrams   Diagnostic Dominance: Right    Intervention   Intraop TEE 4/65/03: Complications: No known complications during this procedure.  POST-OP IMPRESSIONS  - Left Ventricle: The left ventricle is unchanged from pre-bypass.  - Right Ventricle: The right ventricle appears unchanged from pre-bypass.  - Aorta: The aorta appears unchanged from pre-bypass.  - Left Atrium: The left atrium appears unchanged from pre-bypass.  - Left Atrial Appendage: The left atrial appendage appears unchanged from  pre-bypass.  - Aortic Valve: The aortic valve appears unchanged from pre-bypass.  - Mitral Valve: The mitral valve appears unchanged from pre-bypass.  - Tricuspid Valve: The tricuspid valve appears unchanged from pre-bypass.  - Interatrial Septum: The interatrial septum appears unchanged from  pre-bypass.  - Interventricular Septum: The interventricular septum appears unchanged  from  pre-bypass.  - Pericardium: The pericardium appears unchanged from pre-bypass.   PRE-OP FINDINGS   Left Ventricle: The left ventricle has normal systolic function, with an  ejection fraction of 55-60%. The cavity size was normal. There is no left  ventricular hypertrophy.    Right Ventricle: The right ventricle has normal systolic function. The  cavity was normal. There is no increase in right ventricular wall  thickness.   Left Atrium: Left atrial size was normal in size. No left atrial/left  atrial appendage thrombus was detected.    Right Atrium: Right atrial size was normal in size.   Interatrial Septum: No atrial level shunt detected by color flow Doppler.   Pericardium: There is no evidence of pericardial effusion.   Mitral Valve: The mitral valve is normal in structure. Mitral valve  regurgitation is not visualized by color flow Doppler. There is No  evidence of mitral stenosis.   Tricuspid Valve: The tricuspid valve was normal in structure. Tricuspid  valve regurgitation was not visualized by color flow Doppler.   Aortic Valve: The aortic valve is tricuspid Aortic valve regurgitation was  not visualized by color flow Doppler. There is no stenosis of the aortic  valve.    Pulmonic Valve: The pulmonic valve was not assessed.  Pulmonic valve regurgitation was not assessed by color flow Doppler.       Renold Don MD   Event monitor 03/04/21: Study Highlights    Patch Wear Time:  14 days and 0 hours (2022-05-18T15:15:55-0400 to 2022-06-01T15:15:59-0400)   Patient had a min HR of 49 bpm, max HR of 167 bpm, and avg HR of 67 bpm. Predominant underlying rhythm was Sinus Rhythm. 2 Ventricular Tachycardia runs occurred, the run with the fastest interval lasting 4 beats with a max rate of 156 bpm, the longest lasting 4 beats with an avg rate of 147 bpm. 50 Supraventricular Tachycardia runs occurred, the run with the fastest interval lasting 36.3 secs with a max rate of 167 bpm (avg 150 bpm); the run with the fastest interval was also the longest. Isolated SVEs were frequent (5.5%, 54656), SVE Couplets were rare (<1.0%, 8127),  and SVE Triplets were rare (<1.0%, 910). Isolated VEs were rare (<1.0%, 1272), VE Triplets were rare (<1.0%, 3), and no VE Couplets were present. Ventricular Trigeminy was present.    Assessment / Plan: 1. Coronary disease with remote angioplasty of the LAD and left circumflex coronary  in 1991. Chronic total occlusion of the right coronary. Repeat evaluation with cardiac cath in February  2020 was stable.   He presented in March with  unstable angina. Cardiac cath showed progressive multivessel CAD. He subsequently underwent CABG by Dr Roxan Hockey with LIMA to the LAD, SVG to diagonal and SVG to OM. It was noted that the RCA branches were too small to graft. No significant angina. Off Ranexa and nitrates. Continue  atenolol 25 mg daily. Continue ASA.   2. Hyperlipidemia. Continue on high-dose atorvastatin. Excellent control. At goal.  3. Diabetes mellitus type 2.  on metformin, Januvia, Jardiance and glimiperide. Needs to eliminate sugar/candy intake. Follow up with Dr Alroy Dust.   4. Tobacco abuse. Now stopped.   5. Post op Afib. Converted on amiodarone. Intolerant of amiodarone due to GI side effects. Had recurrent Aflutter that converted spontaneously. Asymptomatic. No recurrence on event monitor. Will continue Eliquis. Would consider stopping ASA on next visit.   Follow up in 3 months.

## 2021-04-03 ENCOUNTER — Encounter: Payer: Self-pay | Admitting: Cardiology

## 2021-04-03 ENCOUNTER — Ambulatory Visit: Payer: Medicare Other | Admitting: Cardiology

## 2021-04-03 ENCOUNTER — Other Ambulatory Visit: Payer: Self-pay

## 2021-04-03 VITALS — BP 106/58 | HR 66 | Ht 64.0 in | Wt 142.2 lb

## 2021-04-03 DIAGNOSIS — I25119 Atherosclerotic heart disease of native coronary artery with unspecified angina pectoris: Secondary | ICD-10-CM

## 2021-04-03 DIAGNOSIS — I480 Paroxysmal atrial fibrillation: Secondary | ICD-10-CM | POA: Diagnosis not present

## 2021-04-03 DIAGNOSIS — E7800 Pure hypercholesterolemia, unspecified: Secondary | ICD-10-CM

## 2021-04-03 DIAGNOSIS — Z720 Tobacco use: Secondary | ICD-10-CM

## 2021-04-03 DIAGNOSIS — E119 Type 2 diabetes mellitus without complications: Secondary | ICD-10-CM | POA: Diagnosis not present

## 2021-04-03 NOTE — Patient Instructions (Signed)
Work on eliminating sugar in [employer].   Increase your walking.   Continue your current medication.

## 2021-04-18 DIAGNOSIS — I2510 Atherosclerotic heart disease of native coronary artery without angina pectoris: Secondary | ICD-10-CM | POA: Diagnosis not present

## 2021-04-18 DIAGNOSIS — E7800 Pure hypercholesterolemia, unspecified: Secondary | ICD-10-CM | POA: Diagnosis not present

## 2021-04-18 DIAGNOSIS — E113293 Type 2 diabetes mellitus with mild nonproliferative diabetic retinopathy without macular edema, bilateral: Secondary | ICD-10-CM | POA: Diagnosis not present

## 2021-04-18 DIAGNOSIS — Z0000 Encounter for general adult medical examination without abnormal findings: Secondary | ICD-10-CM | POA: Diagnosis not present

## 2021-04-29 ENCOUNTER — Telehealth: Payer: Self-pay | Admitting: Cardiology

## 2021-04-29 NOTE — Telephone Encounter (Signed)
  *  STAT* If patient is at the pharmacy, call can be transferred to refill team.   1. Which medications need to be refilled? (please list name of each medication and dose if known)   atenolol (TENORMIN) 25 MG tablet    2. Which pharmacy/location (including street and city if local pharmacy) is medication to be sent to? [place], [place]  3. Do they need a 30 day or 90 day supply? 90 days

## 2021-05-01 ENCOUNTER — Other Ambulatory Visit (HOSPITAL_COMMUNITY): Payer: Self-pay | Admitting: Nurse Practitioner

## 2021-05-01 NOTE — Telephone Encounter (Signed)
Prescription refill request for Eliquis received. Last office visit:jordan 04/03/21 Scr:0.71 02/05/21 Age: 17mWeight:64.5kg

## 2021-05-02 MED ORDER — ATENOLOL 25 MG PO TABS: ORAL_TABLET | ORAL | 3 refills | Status: DC

## 2021-05-02 NOTE — Telephone Encounter (Signed)
Wife of the patient called. The patient's rx was not sent to the pharmacy but she called on 8/2. The wife wanted to make sure that the new rx reflects the updated dose   Take 1 tablet in the AM and 1/2 tablet in the PM, No Print  Dispense: 90 tablet    The patient is going to run out of medication tomorrow

## 2021-05-02 NOTE — Telephone Encounter (Signed)
Spoke to patient's wife she stated husband needs Atenolol refill.Refill sent to pharmacy.

## 2021-05-29 DIAGNOSIS — E11319 Type 2 diabetes mellitus with unspecified diabetic retinopathy without macular edema: Secondary | ICD-10-CM | POA: Diagnosis not present

## 2021-06-04 IMAGING — DX DG CHEST 1V PORT
1 series · 1 of 1 positions shown · non-contrast
Comparison: December 28, 2020

CLINICAL DATA: Chest pain

EXAM:
PORTABLE CHEST 1 VIEW

[chest]
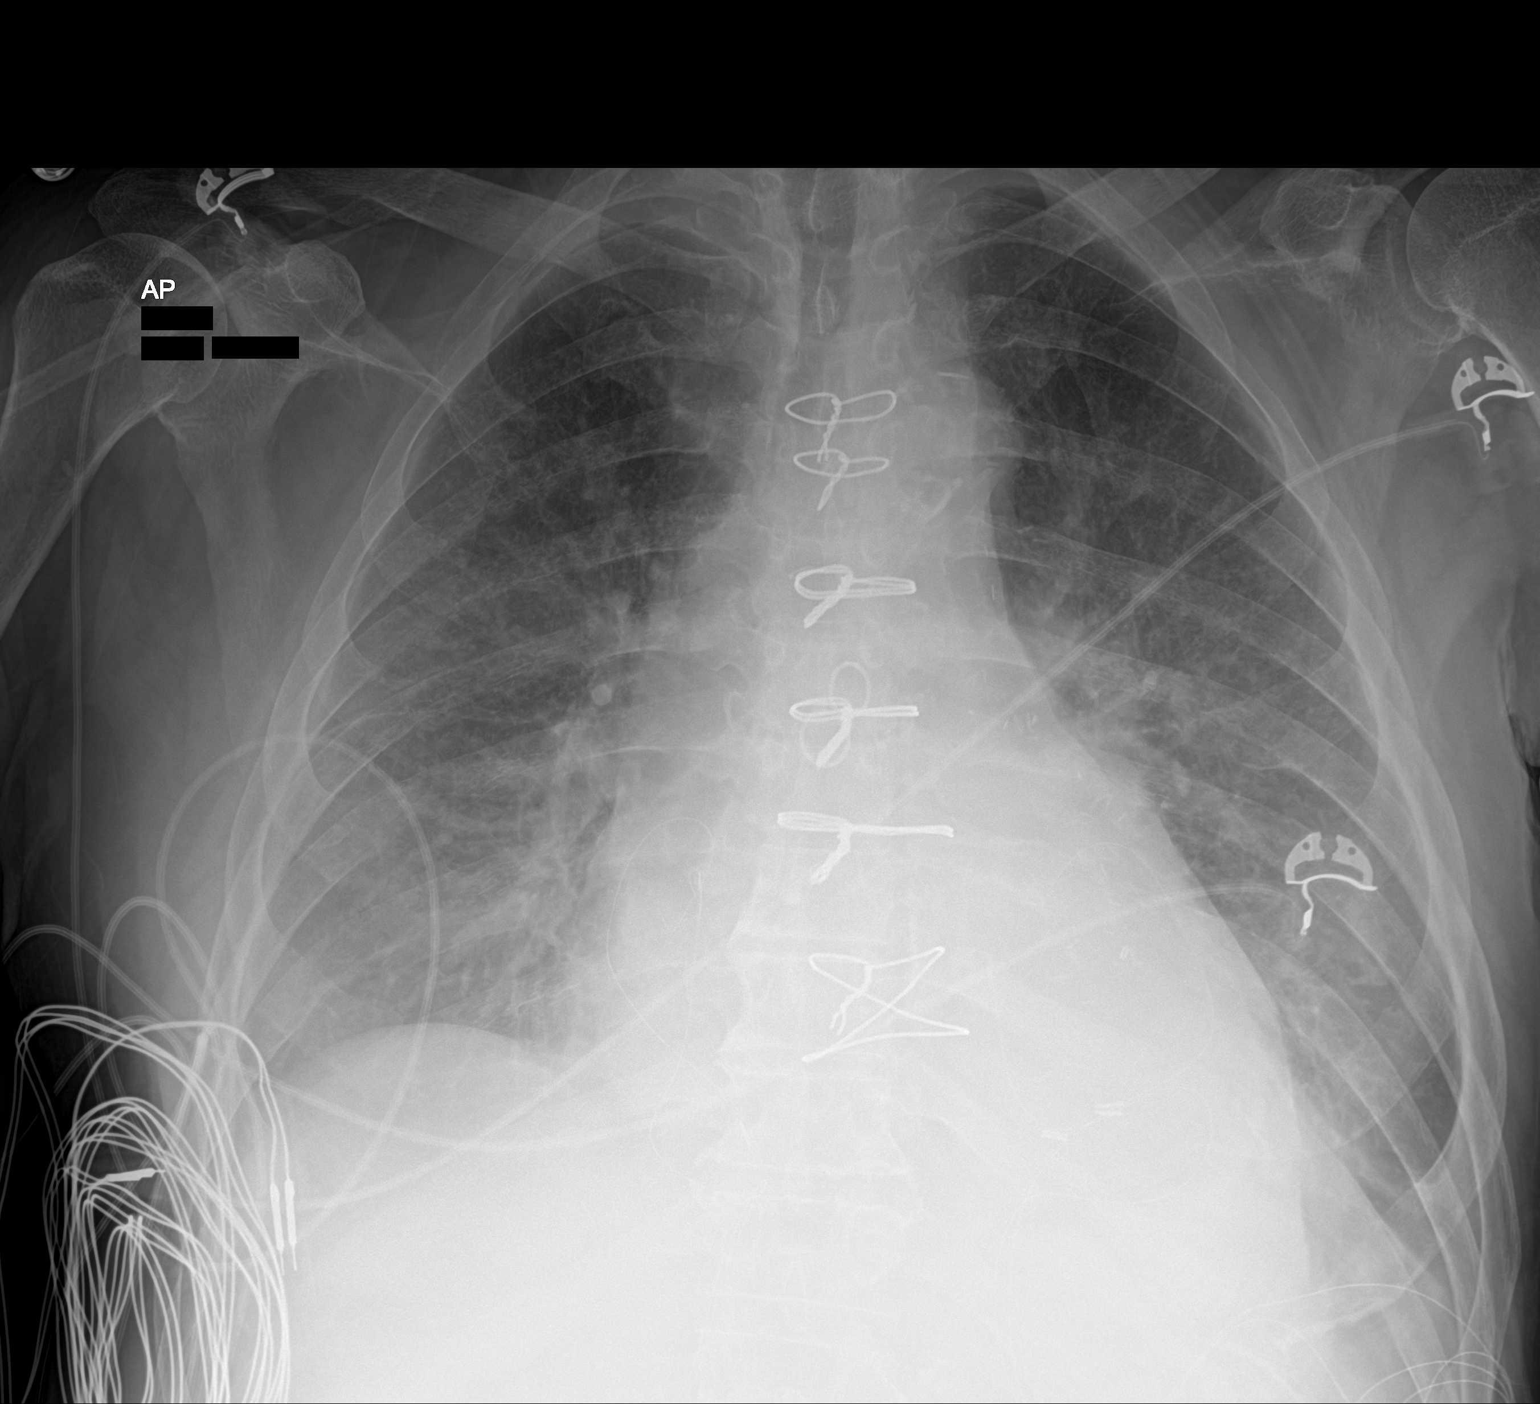

[1 of 1 positions shown; findings below may reference images not displayed]

FINDINGS: Cordis has been removed. Temporary pacemaker leads are attached to
the heart. No pneumothorax. There are pleural effusions bilaterally
with bibasilar atelectasis. There is no frank edema or
consolidation. There is cardiomegaly with mild pulmonary venous
hypertension. Patient is status post coronary artery bypass
grafting. There is aortic atherosclerosis. No bone lesions.
IMPRESSION: No pneumothorax. Cardiomegaly with pulmonary vascular congestion.
Pleural effusions. Bibasilar atelectasis. No edema or consolidation.
Postoperative changes noted. Aortic Atherosclerosis ([id number]).

## 2021-06-05 DIAGNOSIS — H6123 Impacted cerumen, bilateral: Secondary | ICD-10-CM | POA: Diagnosis not present

## 2021-06-16 ENCOUNTER — Telehealth: Payer: Self-pay | Admitting: Cardiology

## 2021-06-16 NOTE — Telephone Encounter (Signed)
Pt c/o of Chest Pain: STAT if CP now or developed within 24 hours  1. Are you having CP right now? Not right now,  last CP was Saturday Evening  2. Are you experiencing any other symptoms (ex. SOB, nausea, vomiting, sweating)? NO only Left Jaw Pain Left arm on Saturday  3. How long have you been experiencing CP? Not having CP now but with a little bit of activity Cp starts  4. Is your CP continuous or coming and going? When pt is having CP it is continuous   5. Have you taken Nitroglycerin? Pt have taken 3-4 Nitro over the last 2-3wks  ?

## 2021-06-16 NOTE — Telephone Encounter (Signed)
Spoke to patient he stated he has been having chest pain off and on for the past 2 weeks.Stated pain starts in center of chest radiates up into jaw and down left arm.Stated each episode has been relieved by NTG x 1.No chest pain at present.Appointment scheduled with DOD Dr.Hochrein 06/19/21 at 2:30 pm.Advised to go to ED if he has any more chest pain.

## 2021-06-17 NOTE — Telephone Encounter (Signed)
Agree  PJ

## 2021-06-18 DIAGNOSIS — I480 Paroxysmal atrial fibrillation: Secondary | ICD-10-CM | POA: Insufficient documentation

## 2021-06-18 DIAGNOSIS — R072 Precordial pain: Secondary | ICD-10-CM | POA: Insufficient documentation

## 2021-06-18 NOTE — Progress Notes (Signed)
Cardiology Office Note   Date:  06/19/2021   ID:  DAYLYN AZBILL, DOB [birth year], MRN [id number]  PCP:  Alroy Dust, Carlean Jews.Marlou Sa, MD  Cardiologist:   None   Chief Complaint  Patient presents with   Chest Pain    Left arm and jaw pain.      History of Present Illness: LADD CEN is a 75 y.o. male who presents for evaluation of chest pain.  He has a history of coronary disease with prior angioplasty of the proximal LAD in 1991. He also had angioplasty of the circumflex at that time. He has known occlusion of the right coronary with collateral flow. Last Myoview study in October 2015 showed a small anterolateral scar. No ischemia.    He had chest pain earlier this year and had a cardia cath with disease as illustrated below.  He subsequently underwent CABG by Dr Roxan Hockey with LIMA to the LAD, SVG to diagonal and SVG to OM. It was noted that the RCA branches were too small to graft.   Over the last 2 to 3 weeks he has had recurrent chest discomfort.  This is similar to his previous angina although not nearly as bad.  He still describes it as 6 out of 10 in intensity.  It is a burning discomfort.  There is some discomfort in his left jaw and arm.  It only happens with exertion probably walking 50 yards on level ground.  It goes away in a couple of minutes if he takes a nitroglycerin.  If he does not have a nitroglycerin it might last for 10 minutes.  He does not describe associated nausea vomiting or diaphoresis.  He is not having any new shortness of breath, PND or orthopnea.  He has had no weight gain or edema.  He has never he does not think fully recovered from his surgery and that he has continued fatigue and decreased exercise tolerance.   Past Medical History:  Diagnosis Date   Asthma    as a child, no problems as an adult, no inhaler   Coronary artery disease    Prior PCI to the prox LAD in 1991; S/P PCI to LCX in October 1991   Diabetes mellitus    TYPE 2   GERD  (gastroesophageal reflux disease)    occasional - diet controlled   History of kidney stones    many yrs ago   Hyperlipidemia    Tobacco abuse    quit 09/2020 - smoked for 64 yrs    Past Surgical History:  Procedure Laterality Date   COLONOSCOPY WITH PROPOFOL N/A 04/21/2016   Procedure: COLONOSCOPY WITH PROPOFOL;  Surgeon: Garlan Fair, MD;  Location: [facility];  Service: Endoscopy;  Laterality: N/A;   CORONARY ANGIOPLASTY  05/1990   PROXIMAL LAD   CORONARY ANGIOPLASTY  06/1990   LEFT CIRCUMFLEX CORONARY   CORONARY ARTERY BYPASS GRAFT N/A 12/26/2020   Procedure: CORONARY ARTERY BYPASS GRAFTING (CABG), ON PUMP, TIMES THREE, USING LEFT INTERNAL MAMMARY ARTERY AND ENDOSCOPICALLY HARVESTED RIGHT GREATER SAPHENOUS VEIN;  Surgeon: Melrose Nakayama, MD;  Location: [facility];  Service: Open Heart Surgery;  Laterality: N/A;   KIDNEY STONE SURGERY Bilateral    x 2 surgeries   LEFT HEART CATH AND CORONARY ANGIOGRAPHY N/A 11/18/2018   Procedure: LEFT HEART CATH AND CORONARY ANGIOGRAPHY;  Surgeon: Martinique, Peter M, MD;  Location: [facility];  Service: Cardiovascular;  Laterality: N/A;   LEFT HEART CATH AND CORONARY ANGIOGRAPHY N/A  12/17/2020   Procedure: LEFT HEART CATH AND CORONARY ANGIOGRAPHY;  Surgeon: Martinique, Peter M, MD;  Location: [facility];  Service: Cardiovascular;  Laterality: N/A;   TEE WITHOUT CARDIOVERSION N/A 12/26/2020   Procedure: TRANSESOPHAGEAL ECHOCARDIOGRAM (TEE);  Surgeon: Melrose Nakayama, MD;  Location: [facility];  Service: Open Heart Surgery;  Laterality: N/A;   UMBILICAL HERNIA REPAIR       Current Outpatient Medications  Medication Sig Dispense Refill   acetaminophen (TYLENOL) 500 MG tablet Take 1,000 mg by mouth every 6 (six) hours as needed for moderate pain or headache.     apixaban (ELIQUIS) 5 MG TABS tablet TAKE 1 TABLET(5 MG) BY MOUTH TWICE DAILY 180 tablet 1   aspirin 81 MG EC tablet Take 1 tablet (81 mg total) by mouth daily.     atenolol  (TENORMIN) 25 MG tablet Take 1 tablet in the AM and 1/2 tablet in the PM 135 tablet 3   atorvastatin (LIPITOR) 80 MG tablet TAKE 1 TABLET BY MOUTH DAILY 90 tablet 3   bisacodyl (DULCOLAX) 5 MG EC tablet Take 5 mg by mouth daily as needed for moderate constipation or mild constipation.     dapagliflozin propanediol (FARXIGA) 5 MG TABS tablet Take 1 tablet (5 mg total) by mouth daily before breakfast. 30 tablet 11   glimepiride (AMARYL) 2 MG tablet Take 1 tablet (2 mg total) by mouth daily with breakfast. 30 tablet 11   isosorbide mononitrate (IMDUR) 60 MG 24 hr tablet Take 1 tablet (60 mg total) by mouth daily. 90 tablet 3   JANUVIA 100 MG tablet Take 100 mg by mouth daily with lunch.      Lancets (ONETOUCH DELICA PLUS [id number]) MISC Apply 1 each topically daily.     metFORMIN (GLUCOPHAGE) 1000 MG tablet Take 1 tablet (1,000 mg total) by mouth 2 (two) times daily.     No current facility-administered medications for this visit.    Allergies:   Sulfa drugs cross reactors    ROS:  Please see the history of present illness.   Otherwise, review of systems are positive for leg pain at the site of his graft harvest.   All other systems are reviewed and negative.    PHYSICAL EXAM: VS:  BP (!) 102/56 (BP Location: Left Arm, Patient Position: Sitting, Cuff Size: Normal)   Pulse 63   Ht '5\' 4"'  (1.626 m)   Wt 146 lb (66.2 kg)   BMI 25.06 kg/m  , BMI Body mass index is 25.06 kg/m. GENERAL:  Well appearing NECK:  No jugular venous distention, waveform within normal limits, carotid upstroke brisk and symmetric, no bruits, no thyromegaly LUNGS:  Clear to auscultation bilaterally CHEST:  Well healed sternotomy scar. HEART:  PMI not displaced or sustained,S1 and S2 within normal limits, no S3, no S4, no clicks, no rubs, no murmurs ABD:  Flat, positive bowel sounds normal in frequency in pitch, no bruits, no rebound, no guarding, no midline pulsatile mass, no hepatomegaly, no splenomegaly EXT:  2 plus  pulses throughout, no edema, no cyanosis no clubbing     EKG:  EKG is ordered today. The ekg ordered today demonstrates sinus rhythm, rate 63, axis within normal limits, right bundle branch block   Recent Labs: 12/25/2020: ALT 47 12/27/2020: Magnesium 1.9 02/05/2021: BUN 12; Creatinine, Ser 0.71; Hemoglobin 11.9; Platelets 184; Potassium 4.7; Sodium 138    Lipid Panel    Component Value Date/Time   CHOL 129 12/13/2020 1451   TRIG 80 12/13/2020  1451   HDL 64 12/13/2020 1451   CHOLHDL 2.0 12/13/2020 1451   LDLCALC 49 12/13/2020 1451      Wt Readings from Last 3 Encounters:  06/19/21 146 lb (66.2 kg)  04/03/21 142 lb 3.2 oz (64.5 kg)  02/25/21 141 lb 6.4 oz (64.1 kg)    CARDIAC CATH:   Diagnostic Dominance: Right    Other studies Reviewed: Additional studies/ records that were reviewed today include: None. Review of the above records demonstrates:  Please see elsewhere in the note.     ASSESSMENT AND PLAN:  Coronary disease/CABG: He has chest pain similar to his previous.  I did review his cath films for this visit.  This certainly could be his small vessel disease or could be having a problem of the graft.  I am going to start by adding Imdur 60 mg daily.  He is also encouraged to continue to take his sublingual's as needed.  I am going to send a message to Dr. Martinique and the patient can contact us in 5 or 6 days and let us know if he still having symptoms.  If so he likely would need a cardiac catheterization.  In the meantime if he has any resting pain he needs to present to the emergency room.  I will check a CBC and a c-Met as well as a TSH.    Hyperlipidemia: He will continue the meds as listed.    Diabetes mellitus type 2:  He will continue with meds as listed.    Post op Afib: There has been no evidence of recurrent fibrillation.    Current medicines are reviewed at length with the patient today.  The patient does not have concerns regarding medicines.  The  following changes have been made:  As above  Labs/ tests ordered today include:   Orders Placed This Encounter  Procedures   CBC   Comprehensive metabolic panel   TSH   EKG 12-Lead      Disposition:   FU with Dr. Martinique or APP in four weeks.    Signed, Minus Breeding, MD  06/19/2021 5:26 PM    [place]

## 2021-06-19 ENCOUNTER — Ambulatory Visit: Payer: Medicare Other | Admitting: Cardiology

## 2021-06-19 ENCOUNTER — Other Ambulatory Visit: Payer: Self-pay

## 2021-06-19 ENCOUNTER — Encounter: Payer: Self-pay | Admitting: Cardiology

## 2021-06-19 VITALS — BP 102/56 | HR 63 | Ht 64.0 in | Wt 146.0 lb

## 2021-06-19 DIAGNOSIS — I2510 Atherosclerotic heart disease of native coronary artery without angina pectoris: Secondary | ICD-10-CM

## 2021-06-19 DIAGNOSIS — R072 Precordial pain: Secondary | ICD-10-CM | POA: Diagnosis not present

## 2021-06-19 DIAGNOSIS — E119 Type 2 diabetes mellitus without complications: Secondary | ICD-10-CM

## 2021-06-19 DIAGNOSIS — E785 Hyperlipidemia, unspecified: Secondary | ICD-10-CM | POA: Diagnosis not present

## 2021-06-19 DIAGNOSIS — Z720 Tobacco use: Secondary | ICD-10-CM | POA: Diagnosis not present

## 2021-06-19 DIAGNOSIS — I480 Paroxysmal atrial fibrillation: Secondary | ICD-10-CM | POA: Diagnosis not present

## 2021-06-19 MED ORDER — ISOSORBIDE MONONITRATE ER 60 MG PO TB24: 60.0000 mg | ORAL_TABLET | Freq: Every day | ORAL | 3 refills | Status: DC

## 2021-06-19 NOTE — Patient Instructions (Addendum)
Medication Instructions:  START isosorbide mononitrate 60mg  daily  *If you need a refill on your cardiac medications before your next appointment, please call your pharmacy*   Lab Work: CBC, CMET, TSH today   If you have labs (blood work) drawn today and your tests are completely normal, you will receive your results only by: [place] (if you have MyChart) OR A paper copy in the mail If you have any lab test that is abnormal or we need to change your treatment, we will call you to review the results.    Follow-Up: At [facility], you and your health needs are our priority.  As part of our continuing mission to provide you with exceptional heart care, we have created designated Provider Care Teams.  These Care Teams include your primary Cardiologist (physician) and Advanced Practice Providers (APPs -  Physician Assistants and Nurse Practitioners) who all work together to provide you with the care you need, when you need it.  We recommend signing up for the patient portal called "MyChart".  Sign up information is provided on this After Visit Summary.  MyChart is used to connect with patients for Virtual Visits (Telemedicine).  Patients are able to view lab/test results, encounter notes, upcoming appointments, etc.  Non-urgent messages can be sent to your provider as well.   To learn more about what you can do with MyChart, go to [email or web address].    Your next appointment:   As scheduled with Dr. Martinique   Other Instructions  CALL early next week with update on your symptoms  If you have chest pain at rest, go to [facility] for evaluation

## 2021-06-20 LAB — CBC
Hematocrit: 39.9 % (ref 37.5–51.0)
Hemoglobin: 12.2 g/dL — ABNORMAL LOW (ref 13.0–17.7)
MCH: 22.1 pg — ABNORMAL LOW (ref 26.6–33.0)
MCHC: 30.6 g/dL — ABNORMAL LOW (ref 31.5–35.7)
MCV: 72 fL — ABNORMAL LOW (ref 79–97)
Platelets: 190 10*3/uL (ref 150–450)
RBC: 5.53 x10E6/uL (ref 4.14–5.80)
RDW: 19.8 % — ABNORMAL HIGH (ref 11.6–15.4)
WBC: 10 10*3/uL (ref 3.4–10.8)

## 2021-06-20 LAB — COMPREHENSIVE METABOLIC PANEL
ALT: 22 IU/L (ref 0–44)
AST: 20 IU/L (ref 0–40)
Albumin/Globulin Ratio: 1.6 (ref 1.2–2.2)
Albumin: 4.2 g/dL (ref 3.7–4.7)
Alkaline Phosphatase: 61 IU/L (ref 44–121)
BUN/Creatinine Ratio: 16 (ref 10–24)
BUN: 14 mg/dL (ref 8–27)
Bilirubin Total: 0.3 mg/dL (ref 0.0–1.2)
CO2: 22 mmol/L (ref 20–29)
Calcium: 9.4 mg/dL (ref 8.6–10.2)
Chloride: 101 mmol/L (ref 96–106)
Creatinine, Ser: 0.85 mg/dL (ref 0.76–1.27)
Globulin, Total: 2.7 g/dL (ref 1.5–4.5)
Glucose: 175 mg/dL — ABNORMAL HIGH (ref 65–99)
Potassium: 4.4 mmol/L (ref 3.5–5.2)
Sodium: 140 mmol/L (ref 134–144)
Total Protein: 6.9 g/dL (ref 6.0–8.5)
eGFR: 90 mL/min/{1.73_m2} (ref >59)

## 2021-06-20 LAB — TSH: TSH: 2.03 u[IU]/mL (ref 0.450–4.500)

## 2021-06-23 ENCOUNTER — Other Ambulatory Visit: Payer: Self-pay

## 2021-06-23 NOTE — Progress Notes (Signed)
Spoke to patient he stated since he started taking Imdur 60 mg daily he has not had any chest pain.Advised Dr.Jordan wants to see you this week if you have any more chest pain.Stated he will call me back if he has any chest pain.

## 2021-07-03 ENCOUNTER — Telehealth: Payer: Self-pay

## 2021-07-03 NOTE — Telephone Encounter (Signed)
Spoke to patient Dr.Jordan wanted me to call to see how you were doing.He wanted to know if you have had anymore chest pain.Stated he is doing good.No chest pain.Advised to keep appointment with Dr.Jordan as planned 10/17 at 8:40 am.Advised to call sooner if he has any chest pain.

## 2021-07-07 ENCOUNTER — Telehealth: Payer: Self-pay | Admitting: Cardiology

## 2021-07-07 NOTE — Telephone Encounter (Signed)
Spoke with pt she states "well I talked to Trish, Dr. Doug Sou nurse, and she said if I keep having the pain they can work me in. But I can wait until Monday." Pt advised to go to the Emergency Department if symptoms get worst. Pt verbalized understanding.

## 2021-07-07 NOTE — Telephone Encounter (Signed)
Pt c/o of Chest Pain: STAT if CP now or developed within 24 hours  1. Are you having CP right now? NO NOT SINCE SATURDAY  2. Are you experiencing any other symptoms (ex. SOB, nausea, vomiting, sweating)? NO JUST BURNING IN THE CENTER OF CHEST LEFT ARM AND LEFT JAW  3. How long have you been experiencing CP? PT WOULD HAVE A CP EVERY 2-3 DAYS  4. Is your CP continuous or coming and going? COMING AND GOING  5. Have you taken Nitroglycerin? PT TOOK 1 NITRO Saturday AND THE PAIN STOPPED. PT SPOKE WITH CHERYL ON Friday AND WAS ADVISED IF THE PAIN CONTINUES, WE MIGHT BE ABLE TO SEE HIM EARLIER THAN HIS UPCOMING APPT. PLEASE ADVISE PT FURTHER ?

## 2021-07-11 NOTE — H&P (View-Only) (Signed)
[place] Date of Birth: 11/01/1945 Medical Record #[id number]  History of Present Illness: Benjamin [name] is seen  for evaluation of chest pain. He has a history of coronary disease with prior angioplasty of the proximal LAD in 1991. He also had angioplasty of the circumflex at that time. He has known occlusion of the right coronary with collateral flow. Last Myoview study in October 2015 showed a small anterolateral scar. No ischemia.    On a prior  visit he described 3 episodes of chest pain for which he took sl Ntg. Lasted 5-10 minutes. No clear triggers. One time he became diaphoretic.  He denies dyspnea or palpitation.  He had a Myoview study showing anterior ischemia. This led to a cardiac cath in February 2020 showing occluded RCA and moderate nonobstructive disease in the LAD and LCx. Unchanged from prior study. Medical therapy recommended.   He was on maximal antianginal therapy with  aspirin, atenolol, and isosorbide, Ranexa. He presented in March with  unstable angina. Cardiac cath showed progressive multivessel CAD. He subsequently underwent CABG by Dr Roxan Hockey with LIMA to the LAD, SVG to diagonal and SVG to OM. It was noted that the RCA branches were too small to graft. His post op course was fairly uneventful. He had Afib on post op day #3 but converted on amiodarone. He had post op anemia. When I saw him last we discontinued amiodarone due to Nausea and poor appetite. When seen by Dr Roxan Hockey on May 10 he was in Atrial flutter with rate 150. He was asymptomatic. Was seen in the Afib clinic and was back in NSR. Atenolol dose was increased. Event monitor was placed showing a single episode of SVT lasting 36 seconds.   He was seen on September 22 by Dr Percival Spanish for evaluation of exertional chest pain. He was started on Imdur and is seen back to evaluate response. He states that this helped about a week but then his pain returned. This is described as burning mid sternal pain that  radiates to his left jaw and arm. It is his typical angina. It is relieved with rest and sl Ntg. It is associated with exertion- just walking to his mailbox. Reports sugar around 160. Still eats too many sweets. [place] smoked a few [place]     Allergies as of 07/14/2021       Reactions   Sulfa Drugs Cross Reactors Rash        Medication List        Accurate as of July 14, 2021  8:54 AM. If you have any questions, ask your nurse or doctor.          acetaminophen 500 MG tablet Commonly known as: TYLENOL Take 1,000 mg by mouth every 6 (six) hours as needed for moderate pain or headache.   aspirin 81 MG EC tablet Take 1 tablet (81 mg total) by mouth daily.   atenolol 25 MG tablet Commonly known as: TENORMIN Take 1 tablet in the AM and 1/2 tablet in the PM   atorvastatin 80 MG tablet Commonly known as: LIPITOR TAKE 1 TABLET BY MOUTH DAILY   bisacodyl 5 MG EC tablet Commonly known as: DULCOLAX Take 5 mg by mouth daily as needed for moderate constipation or mild constipation.   Eliquis 5 MG Tabs tablet Generic drug: apixaban TAKE 1 TABLET(5 MG) BY MOUTH TWICE DAILY   Farxiga 10 MG Tabs tablet Generic drug: dapagliflozin propanediol Take 10 mg  by mouth daily. What changed: Another medication with the same name was removed. Continue taking this medication, and follow the directions you see here. Changed by: Velvia Mehrer Martinique, MD   glimepiride 2 MG tablet Commonly known as: Amaryl Take 1 tablet (2 mg total) by mouth daily with breakfast.   isosorbide mononitrate 60 MG 24 hr tablet Commonly known as: IMDUR Take 1 tablet (60 mg total) by mouth daily.   Januvia 100 MG tablet Generic drug: sitaGLIPtin Take 100 mg by mouth daily with lunch.   metFORMIN 1000 MG tablet Commonly known as: GLUCOPHAGE Take 1 tablet (1,000 mg total) by mouth 2 (two) times daily.   OneTouch Delica Plus [id number] Misc Apply 1 each topically daily.          Allergies  Allergen Reactions   Sulfa Drugs Cross Reactors Rash    Past Medical History:  Diagnosis Date   Asthma    as a child, no problems as an adult, no inhaler   Coronary artery disease    Prior PCI to the prox LAD in 1991; S/P PCI to LCX in October 1991   Diabetes mellitus    TYPE 2   GERD (gastroesophageal reflux disease)    occasional - diet controlled   History of kidney stones    many yrs ago   Hyperlipidemia    Tobacco abuse    quit 09/2020 - smoked for 64 yrs    Past Surgical History:  Procedure Laterality Date   COLONOSCOPY WITH PROPOFOL N/A 04/21/2016   Procedure: COLONOSCOPY WITH PROPOFOL;  Surgeon: Garlan Fair, MD;  Location: [facility];  Service: Endoscopy;  Laterality: N/A;   CORONARY ANGIOPLASTY  05/1990   PROXIMAL LAD   CORONARY ANGIOPLASTY  06/1990   LEFT CIRCUMFLEX CORONARY   CORONARY ARTERY BYPASS GRAFT N/A 12/26/2020   Procedure: CORONARY ARTERY BYPASS GRAFTING (CABG), ON PUMP, TIMES THREE, USING LEFT INTERNAL MAMMARY ARTERY AND ENDOSCOPICALLY HARVESTED RIGHT GREATER SAPHENOUS VEIN;  Surgeon: Melrose Nakayama, MD;  Location: [facility];  Service: Open Heart Surgery;  Laterality: N/A;   KIDNEY STONE SURGERY Bilateral    x 2 surgeries   LEFT HEART CATH AND CORONARY ANGIOGRAPHY N/A 11/18/2018   Procedure: LEFT HEART CATH AND CORONARY ANGIOGRAPHY;  Surgeon: Martinique, Haili Donofrio M, MD;  Location: [facility];  Service: Cardiovascular;  Laterality: N/A;   LEFT HEART CATH AND CORONARY ANGIOGRAPHY N/A 12/17/2020   Procedure: LEFT HEART CATH AND CORONARY ANGIOGRAPHY;  Surgeon: Martinique, Abcde Oneil M, MD;  Location: [facility];  Service: Cardiovascular;  Laterality: N/A;   TEE WITHOUT CARDIOVERSION N/A 12/26/2020   Procedure: TRANSESOPHAGEAL ECHOCARDIOGRAM (TEE);  Surgeon: Melrose Nakayama, MD;  Location: [facility];  Service: Open Heart Surgery;  Laterality: N/A;   UMBILICAL HERNIA REPAIR      Social History   Tobacco Use  Smoking Status Former    Packs/day: 1.00   Years: 64.00   Pack years: 64.00   Types: Cigarettes   Quit date: 09/28/2020   Years since quitting: 0.7  Smokeless Tobacco Never    Social History   Substance and Sexual Activity  Alcohol Use Yes   Alcohol/week: 3.0 - 4.0 standard drinks   Types: 3 - 4 Standard drinks or equivalent per week   Comment: every other weekend - liquor    Family History  Problem Relation Age of Onset   Heart disease Sister        had open heart surgery    Review of Systems: As  noted in history of present illness.  All other systems were reviewed and are negative.  Physical Exam: BP 140/60 (BP Location: Left Arm)   Pulse 65   Ht 5' 4.5" (1.638 m)   Wt 146 lb 9.6 oz (66.5 kg)   SpO2 98%   BMI 24.78 kg/m  GENERAL:  Well appearing WM in NAD HEENT:  PERRL, EOMI, sclera are clear. Oropharynx is clear. NECK:  No jugular venous distention, carotid upstroke brisk and symmetric, no bruits, no thyromegaly or adenopathy LUNGS:  Clear to auscultation bilaterally CHEST:  Incisions healing well.  HEART:  RRR,  PMI not displaced or sustained,S1 and S2 within normal limits, no S3, no S4: no clicks, no rubs, no murmurs ABD:  Soft, nontender. BS +, no masses or bruits. No hepatomegaly, no splenomegaly EXT:  2 + pulses throughout, no edema, no cyanosis no clubbing.  SKIN:  Warm and dry.  No rashes NEURO:  Alert and oriented x 3. Cranial nerves II through XII intact. PSYCH:  Cognitively intact   LABORATORY DATA:  Lab Results  Component Value Date   WBC 10.0 06/19/2021   HGB 12.2 (L) 06/19/2021   HCT 39.9 06/19/2021   PLT 190 06/19/2021   GLUCOSE 175 (H) 06/19/2021   CHOL 129 12/13/2020   TRIG 80 12/13/2020   HDL 64 12/13/2020   LDLCALC 49 12/13/2020   ALT 22 06/19/2021   AST 20 06/19/2021   NA 140 06/19/2021   K 4.4 06/19/2021   CL 101 06/19/2021   CREATININE 0.85 06/19/2021   BUN 14 06/19/2021   CO2 22 06/19/2021   TSH 2.030 06/19/2021   INR 1.6 (H) 12/26/2020   HGBA1C 8.5  (H) 12/25/2020   Labs dated 08/05/16: cholesterol 115, triglycerides 56, LDL 49, HDL 56. A1c 7.4%. CMET normal. Dated 08/30/17: cholesterol 120, triglycerides 58, HDL 46, LDL 62. A1c 7.8%. Chemistries and CBC normal.  Dated 09/16/18: cholesterol 106, triglycerides 51, HDL 48, LDL48. A1c 7.4%. CMET normal Dated 03/27/19: A1c 7.5%. BMET normal Dated 10/19/19: cholesterol 119, triglycerides 52, HDL 51, LDL 56, CMET normal Dated 04/10/20: A1c 6.8%.  Dated 10/10/20: cholesterol 123, triglycerides 56, HDL 55, LDL 56. A1c 7.1%. CMET normal.  Dated 04/18/21: A1c 8.6%  Ecg 06/19/21 shows NSR rate 63 with nonspecific ST depression. RBBB. I have personally reviewed and interpreted this study.  Myoview 11/09/18: Study Highlights     The left ventricular ejection fraction is normal (55-65%). Nuclear stress EF: 59%. Blood pressure demonstrated a normal response to exercise. There was no ST segment deviation noted during stress. No T wave inversion was noted during stress. Defect 1: There is a small defect of moderate severity present in the mid anterolateral, apical lateral and apex location. Findings consistent with ischemia. This is an intermediate risk study.   The anterolateral perfusion defect on the prior exam was interpreted as fixed, however on today's exam appears to have reversible ischemia. Wall motion appears abnormal in this region.     Cardiac cath 11/18/18:  LEFT HEART CATH AND CORONARY ANGIOGRAPHY  Conclusion     Prox LAD lesion is 45% stenosed. Prox LAD to Mid LAD lesion is 65% stenosed. Ost Cx to Prox Cx lesion is 50% stenosed. Prox Cx to Mid Cx lesion is 50% stenosed. Prox RCA to Dist RCA lesion is 100% stenosed. The left ventricular systolic function is normal. LV end diastolic pressure is normal. The left ventricular ejection fraction is 55-65% by visual estimate.   1. Single vessel occlusive CAD with  CTO of the RCA. Left to right collaterals. There is diffuse moderate calcific  disease involving the LAD and LCx that is not significantly changed since 2011. 2. Normal LV function 3. Normal LV EDP   Plan: continued medical therapy   Cardiac cath 12/17/20:  LEFT HEART CATH AND CORONARY ANGIOGRAPHY    Conclusion    Prox LAD lesion is 45% stenosed. Prox LAD to Mid LAD lesion is 75% stenosed. Ost Cx to Prox Cx lesion is 90% stenosed. Prox Cx to Mid Cx lesion is 60% stenosed. Prox RCA to Dist RCA lesion is 100% stenosed. Ost LM to Mid LM lesion is 30% stenosed. Ost LAD lesion is 80% stenosed. The left ventricular systolic function is normal. LV end diastolic pressure is normal. The left ventricular ejection fraction is 55-65% by visual estimate.   1. Severe 3 vessel obstructive CAD. Patient has CTO of the RCA. Progressive ostial LAD and LCX disease. Severely calcified vessels 2. Normal LV function 3. Normal LVEDP   Plan: recommend referral to CT surgery for consideration of CABG.    Coronary Diagrams   Diagnostic Dominance: Right    Intervention   Intraop TEE 01/19/52: Complications: No known complications during this procedure.  POST-OP IMPRESSIONS  - Left Ventricle: The left ventricle is unchanged from pre-bypass.  - Right Ventricle: The right ventricle appears unchanged from pre-bypass.  - Aorta: The aorta appears unchanged from pre-bypass.  - Left Atrium: The left atrium appears unchanged from pre-bypass.  - Left Atrial Appendage: The left atrial appendage appears unchanged from  pre-bypass.  - Aortic Valve: The aortic valve appears unchanged from pre-bypass.  - Mitral Valve: The mitral valve appears unchanged from pre-bypass.  - Tricuspid Valve: The tricuspid valve appears unchanged from pre-bypass.  - Interatrial Septum: The interatrial septum appears unchanged from  pre-bypass.  - Interventricular Septum: The interventricular septum appears unchanged  from  pre-bypass.  - Pericardium: The pericardium appears unchanged from pre-bypass.    PRE-OP FINDINGS   Left Ventricle: The left ventricle has normal systolic function, with an  ejection fraction of 55-60%. The cavity size was normal. There is no left  ventricular hypertrophy.    Right Ventricle: The right ventricle has normal systolic function. The  cavity was normal. There is no increase in right ventricular wall  thickness.   Left Atrium: Left atrial size was normal in size. No left atrial/left  atrial appendage thrombus was detected.   Right Atrium: Right atrial size was normal in size.   Interatrial Septum: No atrial level shunt detected by color flow Doppler.   Pericardium: There is no evidence of pericardial effusion.   Mitral Valve: The mitral valve is normal in structure. Mitral valve  regurgitation is not visualized by color flow Doppler. There is No  evidence of mitral stenosis.   Tricuspid Valve: The tricuspid valve was normal in structure. Tricuspid  valve regurgitation was not visualized by color flow Doppler.   Aortic Valve: The aortic valve is tricuspid Aortic valve regurgitation was  not visualized by color flow Doppler. There is no stenosis of the aortic  valve.    Pulmonic Valve: The pulmonic valve was not assessed.  Pulmonic valve regurgitation was not assessed by color flow Doppler.       Renold Don MD   Event monitor 03/04/21: Study Highlights    Patch Wear Time:  14 days and 0 hours (2022-05-18T15:15:55-0400 to 2022-06-01T15:15:59-0400)   Patient had a min HR of 49 bpm, max HR of 167 bpm, and  avg HR of 67 bpm. Predominant underlying rhythm was Sinus Rhythm. 2 Ventricular Tachycardia runs occurred, the run with the fastest interval lasting 4 beats with a max rate of 156 bpm, the longest lasting 4 beats with an avg rate of 147 bpm. 50 Supraventricular Tachycardia runs occurred, the run with the fastest interval lasting 36.3 secs with a max rate of 167 bpm (avg 150 bpm); the run with the fastest interval was also the longest.  Isolated SVEs were frequent (5.5%, [id number]), SVE Couplets were rare (<1.0%, 3497), and SVE Triplets were rare (<1.0%, 910). Isolated VEs were rare (<1.0%, 1272), VE Triplets were rare (<1.0%, 3), and no VE Couplets were present. Ventricular Trigeminy was present.    Assessment / Plan: 1. Coronary disease with remote angioplasty of the LAD and left circumflex coronary  in 1991. Chronic total occlusion of the right coronary. Repeat evaluation with cardiac cath in February 2020 was stable.   He presented in March with  unstable angina. Cardiac cath showed progressive multivessel CAD. He subsequently underwent CABG by Dr Roxan Hockey with LIMA to the LAD, SVG to diagonal and SVG to OM. It was noted that the RCA branches were too small to graft. Now with recurrent angina despite addition of long acting nitrates.  Recommed reevaluation with cardiac cath. Suspect early graft failure. Will see what options there are for PCI. The procedure and risks were reviewed including but not limited to death, myocardial infarction, stroke, arrythmias, bleeding, transfusion, emergency surgery, dye allergy, or renal dysfunction. The patient voices understanding and is agreeable to proceed..   2. Hyperlipidemia. Continue on high-dose atorvastatin. Check lipid panel.  3. Diabetes mellitus type 2.  on metformin, Januvia, Jardiance and glimiperide. Needs to eliminate sugar/candy intake. Per primary care.   4. Tobacco abuse. Now stopped for the most part   5. Post op Afib. Converted on amiodarone. Intolerant of amiodarone due to GI side effects. Had recurrent Aflutter that converted spontaneously. Asymptomatic. No recurrence on event monitor. Will stop Eliquis.   Cardiac cath this Thursday.

## 2021-07-11 NOTE — Progress Notes (Signed)
[place] Date of Birth: 08-21-1946 Medical Record #[id number]  History of Present Illness: Benjamin [name] is seen  for evaluation of chest pain. He has a history of coronary disease with prior angioplasty of the proximal LAD in 1991. He also had angioplasty of the circumflex at that time. He has known occlusion of the right coronary with collateral flow. Last Myoview study in October 2015 showed a small anterolateral scar. No ischemia.    On a prior  visit he described 3 episodes of chest pain for which he took sl Ntg. Lasted 5-10 minutes. No clear triggers. One time he became diaphoretic.  He denies dyspnea or palpitation.  He had a Myoview study showing anterior ischemia. This led to a cardiac cath in February 2020 showing occluded RCA and moderate nonobstructive disease in the LAD and LCx. Unchanged from prior study. Medical therapy recommended.   He was on maximal antianginal therapy with  aspirin, atenolol, and isosorbide, Ranexa. He presented in March with  unstable angina. Cardiac cath showed progressive multivessel CAD. He subsequently underwent CABG by Dr Roxan Hockey with LIMA to the LAD, SVG to diagonal and SVG to OM. It was noted that the RCA branches were too small to graft. His post op course was fairly uneventful. He had Afib on post op day #3 but converted on amiodarone. He had post op anemia. When I saw him last we discontinued amiodarone due to Nausea and poor appetite. When seen by Dr Roxan Hockey on May 10 he was in Atrial flutter with rate 150. He was asymptomatic. Was seen in the Afib clinic and was back in NSR. Atenolol dose was increased. Event monitor was placed showing a single episode of SVT lasting 36 seconds.   He was seen on September 22 by Dr Percival Spanish for evaluation of exertional chest pain. He was started on Imdur and is seen back to evaluate response. He states that this helped about a week but then his pain returned. This is described as burning mid sternal pain that  radiates to his left jaw and arm. It is his typical angina. It is relieved with rest and sl Ntg. It is associated with exertion- just walking to his mailbox. Reports sugar around 160. Still eats too many sweets. [place] smoked a few [place]     Allergies as of 07/14/2021       Reactions   Sulfa Drugs Cross Reactors Rash        Medication List        Accurate as of July 14, 2021  8:54 AM. If you have any questions, ask your nurse or doctor.          acetaminophen 500 MG tablet Commonly known as: TYLENOL Take 1,000 mg by mouth every 6 (six) hours as needed for moderate pain or headache.   aspirin 81 MG EC tablet Take 1 tablet (81 mg total) by mouth daily.   atenolol 25 MG tablet Commonly known as: TENORMIN Take 1 tablet in the AM and 1/2 tablet in the PM   atorvastatin 80 MG tablet Commonly known as: LIPITOR TAKE 1 TABLET BY MOUTH DAILY   bisacodyl 5 MG EC tablet Commonly known as: DULCOLAX Take 5 mg by mouth daily as needed for moderate constipation or mild constipation.   Eliquis 5 MG Tabs tablet Generic drug: apixaban TAKE 1 TABLET(5 MG) BY MOUTH TWICE DAILY   Farxiga 10 MG Tabs tablet Generic drug: dapagliflozin propanediol Take 10 mg  by mouth daily. What changed: Another medication with the same name was removed. Continue taking this medication, and follow the directions you see here. Changed by: Britiny Defrain Martinique, MD   glimepiride 2 MG tablet Commonly known as: Amaryl Take 1 tablet (2 mg total) by mouth daily with breakfast.   isosorbide mononitrate 60 MG 24 hr tablet Commonly known as: IMDUR Take 1 tablet (60 mg total) by mouth daily.   Januvia 100 MG tablet Generic drug: sitaGLIPtin Take 100 mg by mouth daily with lunch.   metFORMIN 1000 MG tablet Commonly known as: GLUCOPHAGE Take 1 tablet (1,000 mg total) by mouth 2 (two) times daily.   OneTouch Delica Plus [id number] Misc Apply 1 each topically daily.          Allergies  Allergen Reactions   Sulfa Drugs Cross Reactors Rash    Past Medical History:  Diagnosis Date   Asthma    as a child, no problems as an adult, no inhaler   Coronary artery disease    Prior PCI to the prox LAD in 1991; S/P PCI to LCX in October 1991   Diabetes mellitus    TYPE 2   GERD (gastroesophageal reflux disease)    occasional - diet controlled   History of kidney stones    many yrs ago   Hyperlipidemia    Tobacco abuse    quit 09/2020 - smoked for 64 yrs    Past Surgical History:  Procedure Laterality Date   COLONOSCOPY WITH PROPOFOL N/A 04/21/2016   Procedure: COLONOSCOPY WITH PROPOFOL;  Surgeon: Garlan Fair, MD;  Location: [facility];  Service: Endoscopy;  Laterality: N/A;   CORONARY ANGIOPLASTY  05/1990   PROXIMAL LAD   CORONARY ANGIOPLASTY  06/1990   LEFT CIRCUMFLEX CORONARY   CORONARY ARTERY BYPASS GRAFT N/A 12/26/2020   Procedure: CORONARY ARTERY BYPASS GRAFTING (CABG), ON PUMP, TIMES THREE, USING LEFT INTERNAL MAMMARY ARTERY AND ENDOSCOPICALLY HARVESTED RIGHT GREATER SAPHENOUS VEIN;  Surgeon: Melrose Nakayama, MD;  Location: [facility];  Service: Open Heart Surgery;  Laterality: N/A;   KIDNEY STONE SURGERY Bilateral    x 2 surgeries   LEFT HEART CATH AND CORONARY ANGIOGRAPHY N/A 11/18/2018   Procedure: LEFT HEART CATH AND CORONARY ANGIOGRAPHY;  Surgeon: Martinique, Launa Goedken M, MD;  Location: [facility];  Service: Cardiovascular;  Laterality: N/A;   LEFT HEART CATH AND CORONARY ANGIOGRAPHY N/A 12/17/2020   Procedure: LEFT HEART CATH AND CORONARY ANGIOGRAPHY;  Surgeon: Martinique, Leotha Westermeyer M, MD;  Location: [facility];  Service: Cardiovascular;  Laterality: N/A;   TEE WITHOUT CARDIOVERSION N/A 12/26/2020   Procedure: TRANSESOPHAGEAL ECHOCARDIOGRAM (TEE);  Surgeon: Melrose Nakayama, MD;  Location: [facility];  Service: Open Heart Surgery;  Laterality: N/A;   UMBILICAL HERNIA REPAIR      Social History   Tobacco Use  Smoking Status Former    Packs/day: 1.00   Years: 64.00   Pack years: 64.00   Types: Cigarettes   Quit date: 09/28/2020   Years since quitting: 0.7  Smokeless Tobacco Never    Social History   Substance and Sexual Activity  Alcohol Use Yes   Alcohol/week: 3.0 - 4.0 standard drinks   Types: 3 - 4 Standard drinks or equivalent per week   Comment: every other weekend - liquor    Family History  Problem Relation Age of Onset   Heart disease Sister        had open heart surgery    Review of Systems: As  noted in history of present illness.  All other systems were reviewed and are negative.  Physical Exam: BP 140/60 (BP Location: Left Arm)   Pulse 65   Ht 5' 4.5" (1.638 m)   Wt 146 lb 9.6 oz (66.5 kg)   SpO2 98%   BMI 24.78 kg/m  GENERAL:  Well appearing WM in NAD HEENT:  PERRL, EOMI, sclera are clear. Oropharynx is clear. NECK:  No jugular venous distention, carotid upstroke brisk and symmetric, no bruits, no thyromegaly or adenopathy LUNGS:  Clear to auscultation bilaterally CHEST:  Incisions healing well.  HEART:  RRR,  PMI not displaced or sustained,S1 and S2 within normal limits, no S3, no S4: no clicks, no rubs, no murmurs ABD:  Soft, nontender. BS +, no masses or bruits. No hepatomegaly, no splenomegaly EXT:  2 + pulses throughout, no edema, no cyanosis no clubbing.  SKIN:  Warm and dry.  No rashes NEURO:  Alert and oriented x 3. Cranial nerves II through XII intact. PSYCH:  Cognitively intact   LABORATORY DATA:  Lab Results  Component Value Date   WBC 10.0 06/19/2021   HGB 12.2 (L) 06/19/2021   HCT 39.9 06/19/2021   PLT 190 06/19/2021   GLUCOSE 175 (H) 06/19/2021   CHOL 129 12/13/2020   TRIG 80 12/13/2020   HDL 64 12/13/2020   LDLCALC 49 12/13/2020   ALT 22 06/19/2021   AST 20 06/19/2021   NA 140 06/19/2021   K 4.4 06/19/2021   CL 101 06/19/2021   CREATININE 0.85 06/19/2021   BUN 14 06/19/2021   CO2 22 06/19/2021   TSH 2.030 06/19/2021   INR 1.6 (H) 12/26/2020   HGBA1C 8.5  (H) 12/25/2020   Labs dated 08/05/16: cholesterol 115, triglycerides 56, LDL 49, HDL 56. A1c 7.4%. CMET normal. Dated 08/30/17: cholesterol 120, triglycerides 58, HDL 46, LDL 62. A1c 7.8%. Chemistries and CBC normal.  Dated 09/16/18: cholesterol 106, triglycerides 51, HDL 48, LDL48. A1c 7.4%. CMET normal Dated 03/27/19: A1c 7.5%. BMET normal Dated 10/19/19: cholesterol 119, triglycerides 52, HDL 51, LDL 56, CMET normal Dated 04/10/20: A1c 6.8%.  Dated 10/10/20: cholesterol 123, triglycerides 56, HDL 55, LDL 56. A1c 7.1%. CMET normal.  Dated 04/18/21: A1c 8.6%  Ecg 06/19/21 shows NSR rate 63 with nonspecific ST depression. RBBB. I have personally reviewed and interpreted this study.  Myoview 11/09/18: Study Highlights     The left ventricular ejection fraction is normal (55-65%). Nuclear stress EF: 59%. Blood pressure demonstrated a normal response to exercise. There was no ST segment deviation noted during stress. No T wave inversion was noted during stress. Defect 1: There is a small defect of moderate severity present in the mid anterolateral, apical lateral and apex location. Findings consistent with ischemia. This is an intermediate risk study.   The anterolateral perfusion defect on the prior exam was interpreted as fixed, however on today's exam appears to have reversible ischemia. Wall motion appears abnormal in this region.     Cardiac cath 11/18/18:  LEFT HEART CATH AND CORONARY ANGIOGRAPHY  Conclusion     Prox LAD lesion is 45% stenosed. Prox LAD to Mid LAD lesion is 65% stenosed. Ost Cx to Prox Cx lesion is 50% stenosed. Prox Cx to Mid Cx lesion is 50% stenosed. Prox RCA to Dist RCA lesion is 100% stenosed. The left ventricular systolic function is normal. LV end diastolic pressure is normal. The left ventricular ejection fraction is 55-65% by visual estimate.   1. Single vessel occlusive CAD with  CTO of the RCA. Left to right collaterals. There is diffuse moderate calcific  disease involving the LAD and LCx that is not significantly changed since 2011. 2. Normal LV function 3. Normal LV EDP   Plan: continued medical therapy   Cardiac cath 12/17/20:  LEFT HEART CATH AND CORONARY ANGIOGRAPHY    Conclusion    Prox LAD lesion is 45% stenosed. Prox LAD to Mid LAD lesion is 75% stenosed. Ost Cx to Prox Cx lesion is 90% stenosed. Prox Cx to Mid Cx lesion is 60% stenosed. Prox RCA to Dist RCA lesion is 100% stenosed. Ost LM to Mid LM lesion is 30% stenosed. Ost LAD lesion is 80% stenosed. The left ventricular systolic function is normal. LV end diastolic pressure is normal. The left ventricular ejection fraction is 55-65% by visual estimate.   1. Severe 3 vessel obstructive CAD. Patient has CTO of the RCA. Progressive ostial LAD and LCX disease. Severely calcified vessels 2. Normal LV function 3. Normal LVEDP   Plan: recommend referral to CT surgery for consideration of CABG.    Coronary Diagrams   Diagnostic Dominance: Right    Intervention   Intraop TEE 6/31/49: Complications: No known complications during this procedure.  POST-OP IMPRESSIONS  - Left Ventricle: The left ventricle is unchanged from pre-bypass.  - Right Ventricle: The right ventricle appears unchanged from pre-bypass.  - Aorta: The aorta appears unchanged from pre-bypass.  - Left Atrium: The left atrium appears unchanged from pre-bypass.  - Left Atrial Appendage: The left atrial appendage appears unchanged from  pre-bypass.  - Aortic Valve: The aortic valve appears unchanged from pre-bypass.  - Mitral Valve: The mitral valve appears unchanged from pre-bypass.  - Tricuspid Valve: The tricuspid valve appears unchanged from pre-bypass.  - Interatrial Septum: The interatrial septum appears unchanged from  pre-bypass.  - Interventricular Septum: The interventricular septum appears unchanged  from  pre-bypass.  - Pericardium: The pericardium appears unchanged from pre-bypass.    PRE-OP FINDINGS   Left Ventricle: The left ventricle has normal systolic function, with an  ejection fraction of 55-60%. The cavity size was normal. There is no left  ventricular hypertrophy.    Right Ventricle: The right ventricle has normal systolic function. The  cavity was normal. There is no increase in right ventricular wall  thickness.   Left Atrium: Left atrial size was normal in size. No left atrial/left  atrial appendage thrombus was detected.   Right Atrium: Right atrial size was normal in size.   Interatrial Septum: No atrial level shunt detected by color flow Doppler.   Pericardium: There is no evidence of pericardial effusion.   Mitral Valve: The mitral valve is normal in structure. Mitral valve  regurgitation is not visualized by color flow Doppler. There is No  evidence of mitral stenosis.   Tricuspid Valve: The tricuspid valve was normal in structure. Tricuspid  valve regurgitation was not visualized by color flow Doppler.   Aortic Valve: The aortic valve is tricuspid Aortic valve regurgitation was  not visualized by color flow Doppler. There is no stenosis of the aortic  valve.    Pulmonic Valve: The pulmonic valve was not assessed.  Pulmonic valve regurgitation was not assessed by color flow Doppler.       Renold Don MD   Event monitor 03/04/21: Study Highlights    Patch Wear Time:  14 days and 0 hours (2022-05-18T15:15:55-0400 to 2022-06-01T15:15:59-0400)   Patient had a min HR of 49 bpm, max HR of 167 bpm, and  avg HR of 67 bpm. Predominant underlying rhythm was Sinus Rhythm. 2 Ventricular Tachycardia runs occurred, the run with the fastest interval lasting 4 beats with a max rate of 156 bpm, the longest lasting 4 beats with an avg rate of 147 bpm. 50 Supraventricular Tachycardia runs occurred, the run with the fastest interval lasting 36.3 secs with a max rate of 167 bpm (avg 150 bpm); the run with the fastest interval was also the longest.  Isolated SVEs were frequent (5.5%, 38756), SVE Couplets were rare (<1.0%, 3497), and SVE Triplets were rare (<1.0%, 910). Isolated VEs were rare (<1.0%, 1272), VE Triplets were rare (<1.0%, 3), and no VE Couplets were present. Ventricular Trigeminy was present.    Assessment / Plan: 1. Coronary disease with remote angioplasty of the LAD and left circumflex coronary  in 1991. Chronic total occlusion of the right coronary. Repeat evaluation with cardiac cath in February 2020 was stable.   He presented in March with  unstable angina. Cardiac cath showed progressive multivessel CAD. He subsequently underwent CABG by Dr Roxan Hockey with LIMA to the LAD, SVG to diagonal and SVG to OM. It was noted that the RCA branches were too small to graft. Now with recurrent angina despite addition of long acting nitrates.  Recommed reevaluation with cardiac cath. Suspect early graft failure. Will see what options there are for PCI. The procedure and risks were reviewed including but not limited to death, myocardial infarction, stroke, arrythmias, bleeding, transfusion, emergency surgery, dye allergy, or renal dysfunction. The patient voices understanding and is agreeable to proceed..   2. Hyperlipidemia. Continue on high-dose atorvastatin. Check lipid panel.  3. Diabetes mellitus type 2.  on metformin, Januvia, Jardiance and glimiperide. Needs to eliminate sugar/candy intake. Per primary care.   4. Tobacco abuse. Now stopped for the most part   5. Post op Afib. Converted on amiodarone. Intolerant of amiodarone due to GI side effects. Had recurrent Aflutter that converted spontaneously. Asymptomatic. No recurrence on event monitor. Will stop Eliquis.   Cardiac cath this Thursday.

## 2021-07-14 ENCOUNTER — Encounter: Payer: Self-pay | Admitting: Cardiology

## 2021-07-14 ENCOUNTER — Other Ambulatory Visit: Payer: Self-pay | Admitting: Cardiology

## 2021-07-14 ENCOUNTER — Ambulatory Visit: Payer: Medicare Other | Admitting: Cardiology

## 2021-07-14 ENCOUNTER — Other Ambulatory Visit: Payer: Self-pay

## 2021-07-14 VITALS — BP 140/60 | HR 65 | Ht 64.5 in | Wt 146.6 lb

## 2021-07-14 DIAGNOSIS — R072 Precordial pain: Secondary | ICD-10-CM | POA: Diagnosis not present

## 2021-07-14 DIAGNOSIS — Z951 Presence of aortocoronary bypass graft: Secondary | ICD-10-CM | POA: Diagnosis not present

## 2021-07-14 DIAGNOSIS — I2510 Atherosclerotic heart disease of native coronary artery without angina pectoris: Secondary | ICD-10-CM | POA: Diagnosis not present

## 2021-07-14 DIAGNOSIS — Z720 Tobacco use: Secondary | ICD-10-CM

## 2021-07-14 DIAGNOSIS — E7800 Pure hypercholesterolemia, unspecified: Secondary | ICD-10-CM | POA: Diagnosis not present

## 2021-07-14 DIAGNOSIS — I25118 Atherosclerotic heart disease of native coronary artery with other forms of angina pectoris: Secondary | ICD-10-CM | POA: Diagnosis not present

## 2021-07-14 DIAGNOSIS — E119 Type 2 diabetes mellitus without complications: Secondary | ICD-10-CM

## 2021-07-14 DIAGNOSIS — I25709 Atherosclerosis of coronary artery bypass graft(s), unspecified, with unspecified angina pectoris: Secondary | ICD-10-CM

## 2021-07-14 LAB — CBC WITH DIFFERENTIAL/PLATELET
Basophils Absolute: 0.1 10*3/uL (ref 0.0–0.2)
Basos: 1 %
EOS (ABSOLUTE): 0.1 10*3/uL (ref 0.0–0.4)
Eos: 1 %
Hematocrit: 38.3 % (ref 37.5–51.0)
Hemoglobin: 11.9 g/dL — ABNORMAL LOW (ref 13.0–17.7)
Immature Grans (Abs): 0 10*3/uL (ref 0.0–0.1)
Immature Granulocytes: 0 %
Lymphocytes Absolute: 2.4 10*3/uL (ref 0.7–3.1)
Lymphs: 36 %
MCH: 22 pg — ABNORMAL LOW (ref 26.6–33.0)
MCHC: 31.1 g/dL — ABNORMAL LOW (ref 31.5–35.7)
MCV: 71 fL — ABNORMAL LOW (ref 79–97)
Monocytes Absolute: 0.7 10*3/uL (ref 0.1–0.9)
Monocytes: 10 %
Neutrophils Absolute: 3.5 10*3/uL (ref 1.4–7.0)
Neutrophils: 52 %
Platelets: 192 10*3/uL (ref 150–450)
RBC: 5.42 x10E6/uL (ref 4.14–5.80)
RDW: 19.4 % — ABNORMAL HIGH (ref 11.6–15.4)
WBC: 6.7 10*3/uL (ref 3.4–10.8)

## 2021-07-14 LAB — LIPID PANEL
Chol/HDL Ratio: 2.1 ratio (ref 0.0–5.0)
Cholesterol, Total: 103 mg/dL (ref 100–199)
HDL: 50 mg/dL (ref >39)
LDL Chol Calc (NIH): 40 mg/dL (ref 0–99)
Triglycerides: 53 mg/dL (ref 0–149)
VLDL Cholesterol Cal: 13 mg/dL (ref 5–40)

## 2021-07-14 LAB — BASIC METABOLIC PANEL
BUN/Creatinine Ratio: 16 (ref 10–24)
BUN: 12 mg/dL (ref 8–27)
CO2: 24 mmol/L (ref 20–29)
Calcium: 9.3 mg/dL (ref 8.6–10.2)
Chloride: 102 mmol/L (ref 96–106)
Creatinine, Ser: 0.74 mg/dL — ABNORMAL LOW (ref 0.76–1.27)
Glucose: 151 mg/dL — ABNORMAL HIGH (ref 70–99)
Potassium: 4.7 mmol/L (ref 3.5–5.2)
Sodium: 137 mmol/L (ref 134–144)
eGFR: 94 mL/min/{1.73_m2} (ref >59)

## 2021-07-14 MED ORDER — SODIUM CHLORIDE 0.9% FLUSH: 3.0000 mL | Freq: Two times a day (BID) | INTRAVENOUS | Status: DC

## 2021-07-14 NOTE — Patient Instructions (Signed)
Medication Instructions:  Stop Eliquis Continue all other medications *If you need a refill on your cardiac medications before your next appointment, please call your pharmacy*   Lab Work: Bmet,cbc,lipid panel today   Testing/Procedures: Cardiac Cath  Follow instructions below   Follow-Up: At [facility], you and your health needs are our priority.  As part of our continuing mission to provide you with exceptional heart care, we have created designated Provider Care Teams.  These Care Teams include your primary Cardiologist (physician) and Advanced Practice Providers (APPs -  Physician Assistants and Nurse Practitioners) who all work together to provide you with the care you need, when you need it.  We recommend signing up for the patient portal called "MyChart".  Sign up information is provided on this After Visit Summary.  MyChart is used to connect with patients for Virtual Visits (Telemedicine).  Patients are able to view lab/test results, encounter notes, upcoming appointments, etc.  Non-urgent messages can be sent to your provider as well.   To learn more about what you can do with MyChart, go to [email or web address].    Your next appointment:  To be determined    The format for your next appointment: Office    Provider:  [facility] [place] [address] Dept: [phone] Loc: [phone]  [facility]  07/14/2021  You are scheduled for a Cardiac Cath on Thursday 07/17/21, with Dr.Jordan.  1. Please arrive at the [facility] (Main Entrance A) at [facility]: [address] [place], [place] [address] at 10:00 am (This time is two hours before your procedure to ensure your preparation). Free valet parking service is available.   Special note: Every effort is made to have your procedure done on time. Please understand that  emergencies sometimes delay scheduled procedures.  2. Diet: Do not eat solid foods after midnight.  The patient may have clear liquids until 5am upon the day of the procedure.  3. Labs: You will need to have blood drawn on Monday 10/17 at [facility] office.  4. Medication instructions in preparation for your procedure:      Stop taking Eliquis today 07/14/21  Hold Metformin morning of cath and Hold 2 days after cath.  Hole Glimepiride morning of cath.      On the morning of your procedure, take Aspirin 81 mg and any morning medicines NOT listed above.  You may use sips of water.  5. Plan for one night stay--bring personal belongings. 6. Bring a current list of your medications and current insurance cards. 7. You MUST have a responsible person to drive you home. 8. Someone MUST be with you the first 24 hours after you arrive home or your discharge will be delayed. 9. Please wear clothes that are easy to get on and off and wear slip-on shoes.  Thank you for allowing [place] to care for you!   -- [facility]

## 2021-07-15 ENCOUNTER — Telehealth: Payer: Self-pay | Admitting: *Deleted

## 2021-07-15 NOTE — Telephone Encounter (Signed)
Cardiac catheterization scheduled at [facility] for: Thursday July 17, 2021 [facility] Main Entrance [facility]) at: 10 AM   No solid food after midnight prior to cath, clear liquids until 5 AM day of procedure.  Medication instructions: Hold: Metformin-day of procedure and 48 hours post procedure Glimepiride-AM of procedure Januvia-AM of procedure Farxiga-AM of procedure  Except hold medications usual morning medications can be taken pre-cath with sips of water including aspirin 81 mg.    Confirmed patient has responsible adult to drive home post procedure and be with patient first 24 hours after arriving home.  [facility] does allow one visitor to accompany you and wait in the hospital waiting room while you are there for your procedure. You and your visitor will be asked to wear a mask once you enter the hospital.   Patient reports does not currently have any symptoms concerning for COVID-19 and no household members with COVID-19 like illness.            Reviewed procedure/mask/visitor instructions with patient.

## 2021-07-17 ENCOUNTER — Encounter (HOSPITAL_COMMUNITY)
Admission: RE | Disposition: A | Discharge status: Home / Self Care | Payer: Self-pay | Source: Ambulatory Visit | Attending: Cardiology

## 2021-07-17 ENCOUNTER — Other Ambulatory Visit (HOSPITAL_COMMUNITY): Payer: Self-pay

## 2021-07-17 ENCOUNTER — Ambulatory Visit (HOSPITAL_COMMUNITY)
Admission: RE | Admit: 2021-07-17 | Discharge: 2021-07-17 | Disposition: A | Discharge status: Home / Self Care | Payer: Medicare Other | Source: Ambulatory Visit | Attending: Cardiology | Admitting: Cardiology

## 2021-07-17 ENCOUNTER — Other Ambulatory Visit: Payer: Self-pay

## 2021-07-17 DIAGNOSIS — I2582 Chronic total occlusion of coronary artery: Secondary | ICD-10-CM | POA: Insufficient documentation

## 2021-07-17 DIAGNOSIS — I25118 Atherosclerotic heart disease of native coronary artery with other forms of angina pectoris: Secondary | ICD-10-CM | POA: Insufficient documentation

## 2021-07-17 DIAGNOSIS — Z7982 Long term (current) use of aspirin: Secondary | ICD-10-CM | POA: Insufficient documentation

## 2021-07-17 DIAGNOSIS — Z7984 Long term (current) use of oral hypoglycemic drugs: Secondary | ICD-10-CM | POA: Diagnosis not present

## 2021-07-17 DIAGNOSIS — Z720 Tobacco use: Secondary | ICD-10-CM | POA: Diagnosis present

## 2021-07-17 DIAGNOSIS — Z882 Allergy status to sulfonamides status: Secondary | ICD-10-CM | POA: Insufficient documentation

## 2021-07-17 DIAGNOSIS — Z79899 Other long term (current) drug therapy: Secondary | ICD-10-CM | POA: Diagnosis not present

## 2021-07-17 DIAGNOSIS — I2510 Atherosclerotic heart disease of native coronary artery without angina pectoris: Secondary | ICD-10-CM

## 2021-07-17 DIAGNOSIS — E119 Type 2 diabetes mellitus without complications: Secondary | ICD-10-CM | POA: Insufficient documentation

## 2021-07-17 DIAGNOSIS — I25810 Atherosclerosis of coronary artery bypass graft(s) without angina pectoris: Secondary | ICD-10-CM

## 2021-07-17 DIAGNOSIS — E785 Hyperlipidemia, unspecified: Secondary | ICD-10-CM | POA: Insufficient documentation

## 2021-07-17 DIAGNOSIS — I480 Paroxysmal atrial fibrillation: Secondary | ICD-10-CM | POA: Insufficient documentation

## 2021-07-17 DIAGNOSIS — I25709 Atherosclerosis of coronary artery bypass graft(s), unspecified, with unspecified angina pectoris: Secondary | ICD-10-CM

## 2021-07-17 DIAGNOSIS — Z955 Presence of coronary angioplasty implant and graft: Secondary | ICD-10-CM | POA: Insufficient documentation

## 2021-07-17 DIAGNOSIS — Z87891 Personal history of nicotine dependence: Secondary | ICD-10-CM | POA: Insufficient documentation

## 2021-07-17 DIAGNOSIS — I209 Angina pectoris, unspecified: Secondary | ICD-10-CM | POA: Diagnosis present

## 2021-07-17 DIAGNOSIS — Z951 Presence of aortocoronary bypass graft: Secondary | ICD-10-CM | POA: Diagnosis not present

## 2021-07-17 DIAGNOSIS — Z7901 Long term (current) use of anticoagulants: Secondary | ICD-10-CM | POA: Insufficient documentation

## 2021-07-17 HISTORY — PX: LEFT HEART CATH AND CORS/GRAFTS ANGIOGRAPHY: CATH118250

## 2021-07-17 HISTORY — PX: CORONARY STENT INTERVENTION: CATH118234

## 2021-07-17 LAB — GLUCOSE, CAPILLARY
Glucose-Capillary: 185 mg/dL — ABNORMAL HIGH (ref 70–99)
Glucose-Capillary: 113 mg/dL — ABNORMAL HIGH (ref 70–99)

## 2021-07-17 LAB — POCT ACTIVATED CLOTTING TIME
Activated Clotting Time: 254 seconds
Activated Clotting Time: 324 seconds

## 2021-07-17 SURGERY — LEFT HEART CATH AND CORS/GRAFTS ANGIOGRAPHY
Anesthesia: LOCAL

## 2021-07-17 MED ORDER — ACETAMINOPHEN 325 MG PO TABS: 650.0000 mg | ORAL_TABLET | ORAL | Status: DC | PRN

## 2021-07-17 MED ORDER — SODIUM CHLORIDE 0.9 % WEIGHT BASED INFUSION: 1.0000 mL/kg/h | INTRAVENOUS | Status: DC

## 2021-07-17 MED ORDER — CLOPIDOGREL BISULFATE 75 MG PO TABS
75.0000 mg | ORAL_TABLET | Freq: Every day | ORAL | 11 refills | Status: DC
  Filled 2021-07-17: qty 30, 30d supply, fill #0

## 2021-07-17 MED ORDER — ONDANSETRON HCL 4 MG/2ML IJ SOLN: 4.0000 mg | Freq: Four times a day (QID) | INTRAMUSCULAR | Status: DC | PRN

## 2021-07-17 MED ORDER — HEPARIN SODIUM (PORCINE) 1000 UNIT/ML IJ SOLN
INTRAMUSCULAR | Status: AC
  Filled 2021-07-17: qty 1

## 2021-07-17 MED ORDER — ASPIRIN 81 MG PO CHEW: 81.0000 mg | CHEWABLE_TABLET | ORAL | Status: DC

## 2021-07-17 MED ORDER — SODIUM CHLORIDE 0.9% FLUSH: 3.0000 mL | INTRAVENOUS | Status: DC | PRN

## 2021-07-17 MED ORDER — SODIUM CHLORIDE 0.9 % IV SOLN: 250.0000 mL | INTRAVENOUS | Status: DC | PRN

## 2021-07-17 MED ORDER — NITROGLYCERIN 1 MG/10 ML FOR IR/CATH LAB
INTRA_ARTERIAL | Status: DC | PRN
  Administered 2021-07-17: 200 ug

## 2021-07-17 MED ORDER — FENTANYL CITRATE (PF) 100 MCG/2ML IJ SOLN
INTRAMUSCULAR | Status: AC
  Filled 2021-07-17: qty 2

## 2021-07-17 MED ORDER — LIDOCAINE HCL (PF) 1 % IJ SOLN
INTRAMUSCULAR | Status: DC | PRN
  Administered 2021-07-17: 2 mL

## 2021-07-17 MED ORDER — HEPARIN SODIUM (PORCINE) 1000 UNIT/ML IJ SOLN
INTRAMUSCULAR | Status: DC | PRN
  Administered 2021-07-17: 1000 [IU] via INTRAVENOUS
  Administered 2021-07-17 (×2): 3500 [IU] via INTRAVENOUS

## 2021-07-17 MED ORDER — VERAPAMIL HCL 2.5 MG/ML IV SOLN
INTRAVENOUS | Status: DC | PRN
  Administered 2021-07-17: 10 mL via INTRA_ARTERIAL

## 2021-07-17 MED ORDER — MIDAZOLAM HCL 2 MG/2ML IJ SOLN
INTRAMUSCULAR | Status: AC
  Filled 2021-07-17: qty 2

## 2021-07-17 MED ORDER — CLOPIDOGREL BISULFATE 75 MG PO TABS: 75.0000 mg | ORAL_TABLET | Freq: Every day | ORAL | Status: DC

## 2021-07-17 MED ORDER — HEPARIN (PORCINE) IN NACL 1000-0.9 UT/500ML-% IV SOLN
INTRAVENOUS | Status: AC
  Filled 2021-07-17: qty 500

## 2021-07-17 MED ORDER — SODIUM CHLORIDE 0.9% FLUSH: 3.0000 mL | Freq: Two times a day (BID) | INTRAVENOUS | Status: DC

## 2021-07-17 MED ORDER — FENTANYL CITRATE (PF) 100 MCG/2ML IJ SOLN
INTRAMUSCULAR | Status: DC | PRN
  Administered 2021-07-17: 25 ug via INTRAVENOUS

## 2021-07-17 MED ORDER — NITROGLYCERIN 1 MG/10 ML FOR IR/CATH LAB
INTRA_ARTERIAL | Status: AC
  Filled 2021-07-17: qty 10

## 2021-07-17 MED ORDER — HEPARIN (PORCINE) IN NACL 1000-0.9 UT/500ML-% IV SOLN
INTRAVENOUS | Status: DC | PRN
  Administered 2021-07-17 (×2): 500 mL

## 2021-07-17 MED ORDER — IOHEXOL 350 MG/ML SOLN
INTRAVENOUS | Status: DC | PRN
  Administered 2021-07-17: 180 mL

## 2021-07-17 MED ORDER — NITROGLYCERIN 0.4 MG SL SUBL
0.4000 mg | SUBLINGUAL_TABLET | SUBLINGUAL | 2 refills | Status: AC | PRN
  Filled 2021-07-17: qty 25, 7d supply, fill #0

## 2021-07-17 MED ORDER — VERAPAMIL HCL 2.5 MG/ML IV SOLN
INTRAVENOUS | Status: AC
  Filled 2021-07-17: qty 2

## 2021-07-17 MED ORDER — SODIUM CHLORIDE 0.9 % WEIGHT BASED INFUSION
3.0000 mL/kg/h | INTRAVENOUS | Status: AC
  Administered 2021-07-17: 3 mL/kg/h via INTRAVENOUS

## 2021-07-17 MED ORDER — CLOPIDOGREL BISULFATE 300 MG PO TABS
ORAL_TABLET | ORAL | Status: DC | PRN
  Administered 2021-07-17: 600 mg via ORAL

## 2021-07-17 MED ORDER — MIDAZOLAM HCL 2 MG/2ML IJ SOLN
INTRAMUSCULAR | Status: DC | PRN
  Administered 2021-07-17: 1 mg via INTRAVENOUS

## 2021-07-17 MED ORDER — LIDOCAINE HCL (PF) 1 % IJ SOLN
INTRAMUSCULAR | Status: AC
  Filled 2021-07-17: qty 30

## 2021-07-17 SURGICAL SUPPLY — 23 items
BALLN  ~~LOC~~ SAPPHIRE 5.0X12 (BALLOONS) ×2
BALLN SAPPHIRE 2.5X12 (BALLOONS) ×2
BALLN WOLVERINE 2.25X10 (BALLOONS) ×2
BALLN ~~LOC~~ SAPPHIRE 5.0X12 (BALLOONS) ×1
BALLOON SAPPHIRE 2.5X12 (BALLOONS) IMPLANT
BALLOON WOLVERINE 2.25X10 (BALLOONS) IMPLANT
BALLOON ~~LOC~~ SAPPHIRE 5.0X12 (BALLOONS) IMPLANT
CATH INFINITI 5 FR IM (CATHETERS) ×1 IMPLANT
CATH INFINITI 5FR MULTPACK ANG (CATHETERS) ×1 IMPLANT
CATH VISTA GUIDE 6FR AL1 (CATHETERS) ×1 IMPLANT
DEVICE RAD COMP TR BAND LRG (VASCULAR PRODUCTS) ×1 IMPLANT
GLIDESHEATH SLEND SS 6F .021 (SHEATH) ×1 IMPLANT
GUIDEWIRE INQWIRE 1.5J.035X260 (WIRE) IMPLANT
INQWIRE 1.5J .035X260CM (WIRE) ×2
KIT ENCORE 26 ADVANTAGE (KITS) ×1 IMPLANT
KIT HEART LEFT (KITS) ×2 IMPLANT
PACK CARDIAC CATHETERIZATION (CUSTOM PROCEDURE TRAY) ×2 IMPLANT
SHEATH PROBE COVER 6X72 (BAG) ×1 IMPLANT
STENT SYNERGY XD 4.0X12 (Permanent Stent) IMPLANT
SYNERGY XD 4.0X12 (Permanent Stent) ×2 IMPLANT
TRANSDUCER W/STOPCOCK (MISCELLANEOUS) ×2 IMPLANT
TUBING CIL FLEX 10 FLL-RA (TUBING) ×2 IMPLANT
WIRE ASAHI PROWATER 180CM (WIRE) ×1 IMPLANT

## 2021-07-17 NOTE — Progress Notes (Signed)
CARDIAC REHAB PHASE I   Stent education completed with pt. Pt educated on importance of ASA and Plavix. Pt given stent card along with heart healthy and diabetic diets. Reviewed site care, restrictions, and exercise guidelines. Will refer to CRP II [place]  to meet the requirements, but pt uninterested in attending.  [id number] Rufina Falco, RN BSN 07/17/2021 2:53 PM

## 2021-07-17 NOTE — Discharge Summary (Signed)
Discharge Summary for Same Day PCI   Patient ID: Benjamin [name] MRN: [id number]; DOB: 1945/12/13  Admit date: 07/17/2021 Discharge date: 07/17/2021  Primary Care Provider: Alroy Dust, L.Marlou Sa, MD  Primary Cardiologist: Dr. Martinique Primary Electrophysiologist:  None   Discharge Diagnoses    Principal Problem:   Angina pectoris [facility]) Active Problems:   Diabetes mellitus ([place])   Tobacco abuse   S/P CABG x 3   PAF (paroxysmal atrial fibrillation) ([place])    Diagnostic Studies/Procedures    Cardiac Catheterization 07/17/2021:   Ost LM to Mid LM lesion is 30% stenosed.   Prox LAD lesion is 45% stenosed.   Prox LAD to Mid LAD lesion is 75% stenosed.   Ost Cx to Prox Cx lesion is 90% stenosed.   Prox Cx to Mid Cx lesion is 60% stenosed.   Prox RCA to Dist RCA lesion is 100% stenosed.   Ost LAD lesion is 70% stenosed.   Origin to Prox Graft lesion is 100% stenosed.   2nd Mrg lesion is 80% stenosed.   Scoring balloon angioplasty was performed using a [place].   Post intervention, there is a 0% residual stenosis.   Origin lesion is 70% stenosed.   A drug-eluting stent was successfully placed using a SYNERGY XD 4.0X12.   Post intervention, there is a 0% residual stenosis.   LIMA graft was visualized by angiography and is normal in caliber.   SVG graft was visualized by angiography.   The graft exhibits no disease.   The left ventricular systolic function is normal.   LV end diastolic pressure is normal.   The left ventricular ejection fraction is 55-65% by visual estimate.   3 vessel obstructive CAD Patent LIMA to the LAD Occluded SVG to the first diagonal. The native diagonal has good flow SVG to the OM2 is patent but has stenosis at the aorto-ostium and at the distal anastomosis. CTO of the RCA Good LV function Normal LVEDP Successful PCI of the SVG to OM2 with DES at the ostial SVG and scoring balloon angioplasty of the distal anastomosis.   Plan: DAPT with  ASA and Plavix for one year. Do not resume Eliquis. Anticipate same day DC.  Diagnostic Dominance: Right    Intervention    _____________   History of Present Illness     Benjamin [name] is a 75 y.o. male with a history of CAD with prior PCI and more recent CABG x3 (LIMA-LAD, SVG-Diag, SVG-OM) in 11/2020, post-op atrial fibrillation/flutter with no recurrence on monitor in 01/2021, hyperlipidemia, type 2 diabetes mellitus, and tobacco abuse who is followed by Dr. Martinique.  He was seen by Dr. Percival Spanish on 06/19/2021 of exertional chest pain similar to prior angina.  He was started on Imdur.  He was seen again by Dr. Martinique on 07/14/2021 at which time he reported the Imdur helped for about a week and then his pain returned.  He describes the pain as a burning midsternal pain that radiates to his left jaw and arm.  This is similar to his prior typical angina and is relieved with rest and sublingual Nitro.  Therefore, outpatient cardiac catheterization was arranged for further evaluation.  Hospital Course     Patient presented to [facility] on 07/17/2021 for planned cardiac catheterization. Cath showed occluded SVG to 1st Diag, patient SVG to OM2 with stenosis at the aorto-ostium and at the distal anastomosis, and patient LIMA to LAD. Patient underwent successful PCI of the SVG to OM2 with DES at  the ostial SVG and scoring balloon angioplasty of the distal anastomosis. Plan is for DAPT with Aspirin and Plavix for at least 1 year. Will not resume Eliquis given no recurrent atrial fibrillation (only occurred post-op following CABG). The patient was seen by Cardiac Rehab while in short stay. There were no observed complications post cath. Radial cath site was re-evaluated prior to discharge and found to be stable without any complications. Instructions/precautions regarding cath site care were given prior to discharge.  [name] was seen by Dr. Martinique and determined stable for discharge home.  Follow up with our office has been arranged. Medications are listed below. Pertinent changes include initiation  _____________  Cath/PCI Registry Performance & Quality Measures: Aspirin prescribed? - Yes ADP Receptor Inhibitor (Plavix/Clopidogrel, Brilinta/Ticagrelor or Effient/Prasugrel) prescribed (includes medically managed patients)? - Yes High Intensity Statin (Lipitor 40-80mg  or Crestor 20-40mg ) prescribed? - Yes For EF <40%, was ACEI/ARB prescribed? - Not Applicable (EF >/= 16%) For EF <40%, Aldosterone Antagonist (Spironolactone or Eplerenone) prescribed? - Not Applicable (EF >/= 10%) Cardiac Rehab Phase II ordered (Included Medically managed Patients)? - Yes  _____________   Discharge Vitals Blood pressure 126/61, pulse (!) 59, temperature 97.9 F (36.6 C), temperature source Oral, resp. rate 18, height 5\' 5"  (1.651 m), weight 63.5 kg, SpO2 96 %.  Filed Weights   07/17/21 1012  Weight: 63.5 kg    Last Labs & Radiologic Studies    CBC No results for input(s): WBC, NEUTROABS, HGB, HCT, MCV, PLT in the last 72 hours. Basic Metabolic Panel No results for input(s): NA, K, CL, CO2, GLUCOSE, BUN, CREATININE, CALCIUM, MG, PHOS in the last 72 hours. Liver Function Tests No results for input(s): AST, ALT, ALKPHOS, BILITOT, PROT, ALBUMIN in the last 72 hours. No results for input(s): LIPASE, AMYLASE in the last 72 hours. High Sensitivity Troponin:   No results for input(s): TROPONINIHS in the last 720 hours.  BNP Invalid input(s): POCBNP D-Dimer No results for input(s): DDIMER in the last 72 hours. Hemoglobin A1C No results for input(s): HGBA1C in the last 72 hours. Fasting Lipid Panel No results for input(s): CHOL, HDL, LDLCALC, TRIG, CHOLHDL, LDLDIRECT in the last 72 hours. Thyroid Function Tests No results for input(s): TSH, T4TOTAL, T3FREE, THYROIDAB in the last 72 hours.  Invalid input(s): FREET3 _____________  CARDIAC CATHETERIZATION  Result Date: 07/17/2021    Ost LM to Mid LM lesion is 30% stenosed.   Prox LAD lesion is 45% stenosed.   Prox LAD to Mid LAD lesion is 75% stenosed.   Ost Cx to Prox Cx lesion is 90% stenosed.   Prox Cx to Mid Cx lesion is 60% stenosed.   Prox RCA to Dist RCA lesion is 100% stenosed.   Ost LAD lesion is 70% stenosed.   Origin to Prox Graft lesion is 100% stenosed.   2nd Mrg lesion is 80% stenosed.   Scoring balloon angioplasty was performed using a [place].   Post intervention, there is a 0% residual stenosis.   Origin lesion is 70% stenosed.   A drug-eluting stent was successfully placed using a SYNERGY XD 4.0X12.   Post intervention, there is a 0% residual stenosis.   LIMA graft was visualized by angiography and is normal in caliber.   SVG graft was visualized by angiography.   The graft exhibits no disease.   The left ventricular systolic function is normal.   LV end diastolic pressure is normal.   The left ventricular ejection fraction is 55-65% by visual  estimate. 3 vessel obstructive CAD Patent LIMA to the LAD Occluded SVG to the first diagonal. The native diagonal has good flow SVG to the OM2 is patent but has stenosis at the aorto-ostium and at the distal anastomosis. CTO of the RCA Good LV function Normal LVEDP Successful PCI of the SVG to OM2 with DES at the ostial SVG and scoring balloon angioplasty of the distal anastomosis. Plan: DAPT with ASA and Plavix for one year. Do not resume Eliquis. Anticipate same day DC.    Disposition   Patient is being discharged home today in good condition.  Follow-up Plans & Appointments     Follow-up Information     Martinique, Peter M, MD Follow up.   Specialty: Cardiology Why: Follow-up scheduled for 08/04/2021 at 10:20am. Please arrive 15 minutes early for check-in. If this date/time does not work for you, please call our office to reschedule. Contact information: [address] [place] [address] [phone]                Discharge  Instructions     Amb Referral to Cardiac Rehabilitation   Complete by: As directed    Diagnosis: Coronary Stents   After initial evaluation and assessments completed: Virtual Based Care may be provided alone or in conjunction with Phase 2 Cardiac Rehab based on patient barriers.: Yes        Discharge Medications   Allergies as of 07/17/2021       Reactions   Sulfa Drugs Cross Reactors Rash        Medication List     TAKE these medications    acetaminophen 500 MG tablet Commonly known as: TYLENOL Take 1,000 mg by mouth every 6 (six) hours as needed for moderate pain or headache.   ANTI-FUNGAL EX Apply 1 application topically daily at 12 noon. Jock itch   aspirin 81 MG EC tablet Take 1 tablet (81 mg total) by mouth daily.   atenolol 25 MG tablet Commonly known as: TENORMIN Take 1 tablet in the AM and 1/2 tablet in the PM   atorvastatin 80 MG tablet Commonly known as: LIPITOR TAKE 1 TABLET BY MOUTH DAILY   bisacodyl 5 MG EC tablet Commonly known as: DULCOLAX Take 5 mg by mouth daily as needed for moderate constipation or mild constipation.   clopidogrel 75 MG tablet Commonly known as: PLAVIX Take 1 tablet (75 mg total) by mouth daily with breakfast. Start taking on: July 18, 2021   Farxiga 10 MG Tabs tablet Generic drug: dapagliflozin propanediol Take 10 mg by mouth daily.   glimepiride 2 MG tablet Commonly known as: Amaryl Take 1 tablet (2 mg total) by mouth daily with breakfast.   isosorbide mononitrate 60 MG 24 hr tablet Commonly known as: IMDUR Take 1 tablet (60 mg total) by mouth daily.   Januvia 100 MG tablet Generic drug: sitaGLIPtin Take 100 mg by mouth daily with lunch.   metFORMIN 1000 MG tablet Commonly known as: GLUCOPHAGE Take 1 tablet (1,000 mg total) by mouth 2 (two) times daily.   nitroGLYCERIN 0.4 MG SL tablet Commonly known as: Nitrostat Place 1 tablet (0.4 mg total) under the tongue every 5 (five) minutes as needed for  chest pain.   OneTouch Delica Plus [id number] Misc Apply 1 each topically daily.           Allergies Allergies  Allergen Reactions   Sulfa Drugs Cross Reactors Rash    Outstanding Labs/Studies   N/A  Duration of Discharge Encounter  Greater than 30 minutes including physician time.  Signed, Darreld Mclean, PA-C 07/17/2021, 4:18 PM

## 2021-07-17 NOTE — Interval H&P Note (Signed)
History and Physical Interval Note:  07/17/2021 11:43 AM  Luqman De Nurse  has presented today for surgery, with the diagnosis of chest pain, CAD.  The various methods of treatment have been discussed with the patient and family. After consideration of risks, benefits and other options for treatment, the patient has consented to  Procedure(s): LEFT HEART CATH AND CORS/GRAFTS ANGIOGRAPHY (N/A) as a surgical intervention.  The patient's history has been reviewed, patient examined, no change in status, stable for surgery.  I have reviewed the patient's chart and labs.  Questions were answered to the patient's satisfaction.   Cath Lab Visit (complete for each Cath Lab visit)  Clinical Evaluation Leading to the Procedure:   ACS: No.  Non-ACS:    Anginal Classification: CCS III  Anti-ischemic medical therapy: Maximal Therapy (2 or more classes of medications)  Non-Invasive Test Results: No non-invasive testing performed  Prior CABG: Previous CABG        [name] [facility] 07/17/2021 11:44 AM

## 2021-07-17 NOTE — Progress Notes (Signed)
Pt seen by cardiac rehab, APP, and pharmacy prior to DC.  Pt ambulated without difficulty or bleeding.   Discharged home with his son who will drive and his wife who will stay with pt x 24 hrs.

## 2021-07-17 NOTE — Progress Notes (Signed)
Pt's right forearm IV noted to be infiltrated upon assessment. Pt's fluids stopped, IV removed, head applied, and wrapped in coban. Callie, [place] aware.

## 2021-07-17 NOTE — Discharge Instructions (Signed)
Radial Site Care  This sheet gives you information about how to care for yourself after your procedure. Your health care provider may also give you more specific instructions. If you have problems or questions, contact your health care provider. What can I expect after the procedure? After the procedure, it is common to have: Bruising and tenderness at the catheter insertion area. Follow these instructions at home: Medicines Take over-the-counter and prescription medicines only as told by your health care provider. Insertion site care Follow instructions from your health care provider about how to take care of your insertion site. Make sure you: Wash your hands with soap and water before you remove your bandage (dressing). If soap and water are not available, use hand sanitizer. May remove dressing in 24 hours. Check your insertion site every day for signs of infection. Check for: Redness, swelling, or pain. Fluid or blood. Pus or a bad smell. Warmth. Do no take baths, swim, or use a hot tub for 5 days. You may shower 24-48 hours after the procedure. Remove the dressing and gently wash the site with plain soap and water. Pat the area dry with a clean towel. Do not rub the site. That could cause bleeding. Do not apply powder or lotion to the site. Activity  For 24 hours after the procedure, or as directed by your health care provider: Do not flex or bend the affected arm. Do not push or pull heavy objects with the affected arm. Do not drive yourself home from the hospital or clinic. You may drive 24 hours after the procedure. Do not operate machinery or power tools. KEEP ARM ELEVATED THE REMAINDER OF THE DAY. Do not push, pull or lift anything that is heavier than 10 lb for 5 days. Ask your health care provider when it is okay to: Return to work or school. Resume usual physical activities or sports. Resume sexual activity. General instructions If the catheter site starts to  bleed, raise your arm and put firm pressure on the site. If the bleeding does not stop, get help right away. This is a medical emergency. DRINK PLENTY OF FLUIDS FOR THE NEXT 2-3 DAYS. No alcohol consumption for 24 hours after receiving sedation. If you went home on the same day as your procedure, a responsible adult should be with you for the first 24 hours after you arrive home. Keep all follow-up visits as told by your health care provider. This is important. Contact a health care provider if: You have a fever. You have redness, swelling, or yellow drainage around your insertion site. Get help right away if: You have unusual pain at the radial site. The catheter insertion area swells very fast. The insertion area is bleeding, and the bleeding does not stop when you hold steady pressure on the area. Your arm or hand becomes pale, cool, tingly, or numb. These symptoms may represent a serious problem that is an emergency. Do not wait to see if the symptoms will go away. Get medical help right away. Call your local emergency services (911 in the [place] Do not drive yourself to the hospital. Summary After the procedure, it is common to have bruising and tenderness at the site. Follow instructions from your health care provider about how to take care of your radial site wound. Check the wound every day for signs of infection.  This information is not intended to replace advice given to you by your health care provider. Make sure you discuss any questions you have with   your health care provider. Document Revised: 10/20/2017 Document Reviewed: 10/20/2017 Elsevier Patient Education  2020 [employer]  

## 2021-07-18 ENCOUNTER — Encounter (HOSPITAL_COMMUNITY): Payer: Self-pay | Admitting: Cardiology

## 2021-07-24 ENCOUNTER — Other Ambulatory Visit (HOSPITAL_COMMUNITY): Payer: Self-pay

## 2021-07-24 ENCOUNTER — Telehealth (HOSPITAL_COMMUNITY): Payer: Self-pay | Admitting: Pharmacist

## 2021-07-24 NOTE — Telephone Encounter (Signed)
Pharmacy Transitions of Care Follow-up Telephone Call  Date of discharge: 07/17/21   How have you been since you were released from the hospital? Good   Medication changes made at discharge:  - START:  clopidogrel, Nitrostat  - STOPPED: NA  - CHANGED: NA  Medication changes verified by the patient? YES (Yes/No)    Medication Accessibility:  Home Pharmacy: [facility] [phone]   Was the patient provided with refills on discharged medications? Yes   Have all prescriptions been transferred from [facility] to home pharmacy? YES   Is the patient able to afford medications? YES    Medication Review: CLOPIDOGREL (PLAVIX) Clopidogrel 75 mg once daily.  - Educated patient on expected duration of therapy of  with clopidogrel. Advised patient that aspirin will be continued indefinitely.  - Reviewed potential DDIs with patient  - Advised patient of medications to avoid (NSAIDs, ASA)  - Educated that Tylenol (acetaminophen) will be the preferred analgesic to prevent risk of bleeding  - Emphasized importance of monitoring for signs and symptoms of bleeding (abnormal bruising, prolonged bleeding, nose bleeds, bleeding from gums, discolored urine, black tarry stools)  - Advised patient to alert all providers of anticoagulation therapy prior to starting a new medication or having a procedure    Follow-up Appointments:  [facility] f/u appt confirmed? Yes Scheduled to see Martinique on 08/04/21 @ 10:20.   If their condition worsens, is the pt aware to call PCP or go to the Emergency Dept.? Yes  Final Patient Assessment: Patient reports he is doing well.  Reviewed medications and completed transfers.

## 2021-07-25 NOTE — Progress Notes (Signed)
Benjamin [name] De Nurse Date of Birth: [birth year] Medical Record #[id number]  History of Present Illness: Benjamin [name] is seen  for follow up s/p cardiac cath. He has a history of coronary disease with prior angioplasty of the proximal LAD in 1991. He also had angioplasty of the circumflex at that time. He has known occlusion of the right coronary with collateral flow. Last Myoview study in October 2015 showed a small anterolateral scar. No ischemia.    On a prior  visit he described 3 episodes of chest pain for which he took sl Ntg. Lasted 5-10 minutes. No clear triggers. One time he became diaphoretic.  He denies dyspnea or palpitation.  He had a Myoview study showing anterior ischemia. This led to a cardiac cath in February 2020 showing occluded RCA and moderate nonobstructive disease in the LAD and LCx. Unchanged from prior study. Medical therapy recommended.   He was on maximal antianginal therapy with  aspirin, atenolol, and isosorbide, Ranexa. He presented in March with  unstable angina. Cardiac cath showed progressive multivessel CAD. He subsequently underwent CABG by Dr Roxan Hockey with LIMA to the LAD, SVG to diagonal and SVG to OM. It was noted that the RCA branches were too small to graft. His post op course was fairly uneventful. He had Afib on post op day #3 but converted on amiodarone. He had post op anemia. When I saw him last we discontinued amiodarone due to Nausea and poor appetite. When seen by Dr Roxan Hockey on May 10 he was in Atrial flutter with rate 150. He was asymptomatic. Was seen in the Afib clinic and was back in NSR. Atenolol dose was increased. Event monitor was placed showing a single episode of SVT lasting 36 seconds.   He was seen on September 22 by Dr Percival Spanish for evaluation of exertional chest pain. He was started on Imdur and is seen back to evaluate response. He states that this helped about a week but then his pain returned. This is described as burning mid sternal pain that  radiates to his left jaw and arm. It is his typical angina. It is relieved with rest and sl Ntg. It is associated with exertion- just walking to his mailbox.   He underwent cardiac cath on 07/17/21. This showed patent LIMA to the LAD. The SVG to the first diagonal was occluded but the diagonal had good antegrade flow. The SVG to OM showed aorto-ostial stenosis as well as stenosis at the distal anastomosis. The ostial lesion was successfully stented and the distal lesion was treated with a cutting balloon with good result. Since his intervention he has noted improvement in his symptoms. He had one episode of angina 2 weeks ago while walking and one yesterday while taking out the trash both easily resolved with one sl Ntg. No SOB.     Allergies as of 08/04/2021       Reactions   Sulfa Drugs Cross Reactors Rash        Medication List        Accurate as of August 04, 2021 10:30 AM. If you have any questions, ask your nurse or doctor.          acetaminophen 500 MG tablet Commonly known as: TYLENOL Take 1,000 mg by mouth every 6 (six) hours as needed for moderate pain or headache.   ANTI-FUNGAL EX Apply 1 application topically daily at 12 noon. Jock itch   aspirin 81 MG EC tablet Take 1 tablet (81 mg total) by mouth daily.  atenolol 25 MG tablet Commonly known as: TENORMIN Take 1 tablet in the AM and 1/2 tablet in the PM   atorvastatin 80 MG tablet Commonly known as: LIPITOR TAKE 1 TABLET BY MOUTH DAILY   bisacodyl 5 MG EC tablet Commonly known as: DULCOLAX Take 5 mg by mouth daily as needed for moderate constipation or mild constipation.   clopidogrel 75 MG tablet Commonly known as: PLAVIX Take 1 tablet (75 mg total) by mouth daily with breakfast.   Farxiga 10 MG Tabs tablet Generic drug: dapagliflozin propanediol Take 10 mg by mouth daily.   glimepiride 2 MG tablet Commonly known as: Amaryl Take 1 tablet (2 mg total) by mouth daily with breakfast.   isosorbide  mononitrate 60 MG 24 hr tablet Commonly known as: IMDUR Take 1 tablet (60 mg total) by mouth daily.   Januvia 100 MG tablet Generic drug: sitaGLIPtin Take 100 mg by mouth daily with lunch.   metFORMIN 1000 MG tablet Commonly known as: GLUCOPHAGE Take 1 tablet (1,000 mg total) by mouth 2 (two) times daily.   nitroGLYCERIN 0.4 MG SL tablet Commonly known as: Nitrostat Place 1 tablet (0.4 mg total) under the tongue every 5 (five) minutes as needed for chest pain.   OneTouch Delica Plus [id number] Misc Apply 1 each topically daily.         Allergies  Allergen Reactions   Sulfa Drugs Cross Reactors Rash    Past Medical History:  Diagnosis Date   Asthma    as a child, no problems as an adult, no inhaler   Coronary artery disease    Prior PCI to the prox LAD in 1991; S/P PCI to LCX in October 1991   Diabetes mellitus    TYPE 2   GERD (gastroesophageal reflux disease)    occasional - diet controlled   History of kidney stones    many yrs ago   Hyperlipidemia    Tobacco abuse    quit 09/2020 - smoked for 64 yrs    Past Surgical History:  Procedure Laterality Date   COLONOSCOPY WITH PROPOFOL N/A 04/21/2016   Procedure: COLONOSCOPY WITH PROPOFOL;  Surgeon: Garlan Fair, MD;  Location: [facility];  Service: Endoscopy;  Laterality: N/A;   CORONARY ANGIOPLASTY  05/1990   PROXIMAL LAD   CORONARY ANGIOPLASTY  06/1990   LEFT CIRCUMFLEX CORONARY   CORONARY ARTERY BYPASS GRAFT N/A 12/26/2020   Procedure: CORONARY ARTERY BYPASS GRAFTING (CABG), ON PUMP, TIMES THREE, USING LEFT INTERNAL MAMMARY ARTERY AND ENDOSCOPICALLY HARVESTED RIGHT GREATER SAPHENOUS VEIN;  Surgeon: Melrose Nakayama, MD;  Location: [facility];  Service: Open Heart Surgery;  Laterality: N/A;   CORONARY STENT INTERVENTION N/A 07/17/2021   Procedure: CORONARY STENT INTERVENTION;  Surgeon: Martinique, Laderrick Wilk M, MD;  Location: [place] [facility];  Service: Cardiovascular;  Laterality: N/A;   KIDNEY STONE SURGERY  Bilateral    x 2 surgeries   LEFT HEART CATH AND CORONARY ANGIOGRAPHY N/A 11/18/2018   Procedure: LEFT HEART CATH AND CORONARY ANGIOGRAPHY;  Surgeon: Martinique, Justen Fonda M, MD;  Location: [place] [facility];  Service: Cardiovascular;  Laterality: N/A;   LEFT HEART CATH AND CORONARY ANGIOGRAPHY N/A 12/17/2020   Procedure: LEFT HEART CATH AND CORONARY ANGIOGRAPHY;  Surgeon: Martinique, Barbera Perritt M, MD;  Location: [place] [facility];  Service: Cardiovascular;  Laterality: N/A;   LEFT HEART CATH AND CORS/GRAFTS ANGIOGRAPHY N/A 07/17/2021   Procedure: LEFT HEART CATH AND CORS/GRAFTS ANGIOGRAPHY;  Surgeon: Martinique, Layden Caterino M, MD;  Location: [place] [facility];  Service: Cardiovascular;  Laterality: N/A;   TEE WITHOUT CARDIOVERSION N/A 12/26/2020   Procedure: TRANSESOPHAGEAL ECHOCARDIOGRAM (TEE);  Surgeon: Melrose Nakayama, MD;  Location: [place];  Service: Open Heart Surgery;  Laterality: N/A;   UMBILICAL HERNIA REPAIR      Social History   Tobacco Use  Smoking Status Former   Packs/day: 1.00   Years: 64.00   Pack years: 64.00   Types: Cigarettes   Quit date: 09/28/2020   Years since quitting: 0.8  Smokeless Tobacco Never    Social History   Substance and Sexual Activity  Alcohol Use Yes   Alcohol/week: 3.0 - 4.0 standard drinks   Types: 3 - 4 Standard drinks or equivalent per week   Comment: every other weekend - liquor    Family History  Problem Relation Age of Onset   Heart disease Sister        had open heart surgery    Review of Systems: As noted in history of present illness.  All other systems were reviewed and are negative.  Physical Exam: BP (!) 120/58   Pulse 66   Ht 5' 4.5" (1.638 m)   Wt 144 lb 9.6 oz (65.6 kg)   SpO2 98%   BMI 24.44 kg/m  GENERAL:  Well appearing WM in NAD HEENT:  PERRL, EOMI, sclera are clear. Oropharynx is clear. NECK:  No jugular venous distention, carotid upstroke brisk and symmetric, no bruits, no thyromegaly or adenopathy LUNGS:  Clear to auscultation  bilaterally CHEST:  Incisions healing well.  HEART:  RRR,  PMI not displaced or sustained,S1 and S2 within normal limits, no S3, no S4: no clicks, no rubs, no murmurs ABD:  Soft, nontender. BS +, no masses or bruits. No hepatomegaly, no splenomegaly EXT:  2 + pulses throughout, no edema, no cyanosis no clubbing.  SKIN:  Warm and dry.  No rashes NEURO:  Alert and oriented x 3. Cranial nerves II through XII intact. PSYCH:  Cognitively intact   LABORATORY DATA:  Lab Results  Component Value Date   WBC 6.7 07/14/2021   HGB 11.9 (L) 07/14/2021   HCT 38.3 07/14/2021   PLT 192 07/14/2021   GLUCOSE 151 (H) 07/14/2021   CHOL 103 07/14/2021   TRIG 53 07/14/2021   HDL 50 07/14/2021   LDLCALC 40 07/14/2021   ALT 22 06/19/2021   AST 20 06/19/2021   NA 137 07/14/2021   K 4.7 07/14/2021   CL 102 07/14/2021   CREATININE 0.74 (L) 07/14/2021   BUN 12 07/14/2021   CO2 24 07/14/2021   TSH 2.030 06/19/2021   INR 1.6 (H) 12/26/2020   HGBA1C 8.5 (H) 12/25/2020   Labs dated 08/05/16: cholesterol 115, triglycerides 56, LDL 49, HDL 56. A1c 7.4%. CMET normal. Dated 08/30/17: cholesterol 120, triglycerides 58, HDL 46, LDL 62. A1c 7.8%. Chemistries and CBC normal.  Dated 09/16/18: cholesterol 106, triglycerides 51, HDL 48, LDL48. A1c 7.4%. CMET normal Dated 03/27/19: A1c 7.5%. BMET normal Dated 10/19/19: cholesterol 119, triglycerides 52, HDL 51, LDL 56, CMET normal Dated 04/10/20: A1c 6.8%.  Dated 10/10/20: cholesterol 123, triglycerides 56, HDL 55, LDL 56. A1c 7.1%. CMET normal.  Dated 04/18/21: A1c 8.6%  Ecg 06/19/21 shows NSR rate 66 with nonspecific ST depression. RBBB. I have personally reviewed and interpreted this study.  Myoview 11/09/18: Study Highlights     The left ventricular ejection fraction is normal (55-65%). Nuclear stress EF: 59%. Blood pressure demonstrated a normal response to exercise. There was no ST segment  deviation noted during stress. No T wave inversion was noted during  stress. Defect 1: There is a small defect of moderate severity present in the mid anterolateral, apical lateral and apex location. Findings consistent with ischemia. This is an intermediate risk study.   The anterolateral perfusion defect on the prior exam was interpreted as fixed, however on today's exam appears to have reversible ischemia. Wall motion appears abnormal in this region.     Cardiac cath 11/18/18:  LEFT HEART CATH AND CORONARY ANGIOGRAPHY  Conclusion     Prox LAD lesion is 45% stenosed. Prox LAD to Mid LAD lesion is 65% stenosed. Ost Cx to Prox Cx lesion is 50% stenosed. Prox Cx to Mid Cx lesion is 50% stenosed. Prox RCA to Dist RCA lesion is 100% stenosed. The left ventricular systolic function is normal. LV end diastolic pressure is normal. The left ventricular ejection fraction is 55-65% by visual estimate.   1. Single vessel occlusive CAD with CTO of the RCA. Left to right collaterals. There is diffuse moderate calcific disease involving the LAD and LCx that is not significantly changed since 2011. 2. Normal LV function 3. Normal LV EDP   Plan: continued medical therapy   Cardiac cath 12/17/20:  LEFT HEART CATH AND CORONARY ANGIOGRAPHY    Conclusion    Prox LAD lesion is 45% stenosed. Prox LAD to Mid LAD lesion is 75% stenosed. Ost Cx to Prox Cx lesion is 90% stenosed. Prox Cx to Mid Cx lesion is 60% stenosed. Prox RCA to Dist RCA lesion is 100% stenosed. Ost LM to Mid LM lesion is 30% stenosed. Ost LAD lesion is 80% stenosed. The left ventricular systolic function is normal. LV end diastolic pressure is normal. The left ventricular ejection fraction is 55-65% by visual estimate.   1. Severe 3 vessel obstructive CAD. Patient has CTO of the RCA. Progressive ostial LAD and LCX disease. Severely calcified vessels 2. Normal LV function 3. Normal LVEDP   Plan: recommend referral to CT surgery for consideration of CABG.    Coronary  Diagrams   Diagnostic Dominance: Right    Intervention   Intraop TEE 3/41/93: Complications: No known complications during this procedure.  POST-OP IMPRESSIONS  - Left Ventricle: The left ventricle is unchanged from pre-bypass.  - Right Ventricle: The right ventricle appears unchanged from pre-bypass.  - Aorta: The aorta appears unchanged from pre-bypass.  - Left Atrium: The left atrium appears unchanged from pre-bypass.  - Left Atrial Appendage: The left atrial appendage appears unchanged from  pre-bypass.  - Aortic Valve: The aortic valve appears unchanged from pre-bypass.  - Mitral Valve: The mitral valve appears unchanged from pre-bypass.  - Tricuspid Valve: The tricuspid valve appears unchanged from pre-bypass.  - Interatrial Septum: The interatrial septum appears unchanged from  pre-bypass.  - Interventricular Septum: The interventricular septum appears unchanged  from  pre-bypass.  - Pericardium: The pericardium appears unchanged from pre-bypass.   PRE-OP FINDINGS   Left Ventricle: The left ventricle has normal systolic function, with an  ejection fraction of 55-60%. The cavity size was normal. There is no left  ventricular hypertrophy.    Right Ventricle: The right ventricle has normal systolic function. The  cavity was normal. There is no increase in right ventricular wall  thickness.   Left Atrium: Left atrial size was normal in size. No left atrial/left  atrial appendage thrombus was detected.   Right Atrium: Right atrial size was normal in size.   Interatrial Septum: No atrial level shunt  detected by color flow Doppler.   Pericardium: There is no evidence of pericardial effusion.   Mitral Valve: The mitral valve is normal in structure. Mitral valve  regurgitation is not visualized by color flow Doppler. There is No  evidence of mitral stenosis.   Tricuspid Valve: The tricuspid valve was normal in structure. Tricuspid  valve regurgitation was not  visualized by color flow Doppler.   Aortic Valve: The aortic valve is tricuspid Aortic valve regurgitation was  not visualized by color flow Doppler. There is no stenosis of the aortic  valve.    Pulmonic Valve: The pulmonic valve was not assessed.  Pulmonic valve regurgitation was not assessed by color flow Doppler.       Renold Don MD   Event monitor 03/04/21: Study Highlights    Patch Wear Time:  14 days and 0 hours (2022-05-18T15:15:55-0400 to 2022-06-01T15:15:59-0400)   Patient had a min HR of 49 bpm, max HR of 167 bpm, and avg HR of 67 bpm. Predominant underlying rhythm was Sinus Rhythm. 2 Ventricular Tachycardia runs occurred, the run with the fastest interval lasting 4 beats with a max rate of 156 bpm, the longest lasting 4 beats with an avg rate of 147 bpm. 50 Supraventricular Tachycardia runs occurred, the run with the fastest interval lasting 36.3 secs with a max rate of 167 bpm (avg 150 bpm); the run with the fastest interval was also the longest. Isolated SVEs were frequent (5.5%, 16109), SVE Couplets were rare (<1.0%, 3497), and SVE Triplets were rare (<1.0%, 910). Isolated VEs were rare (<1.0%, 1272), VE Triplets were rare (<1.0%, 3), and no VE Couplets were present. Ventricular Trigeminy was present.  Cardiac cath 07/17/21:  CORONARY STENT INTERVENTION  LEFT HEART CATH AND CORS/GRAFTS ANGIOGRAPHY   Conclusion      Ost LM to Mid LM lesion is 30% stenosed.   Prox LAD lesion is 45% stenosed.   Prox LAD to Mid LAD lesion is 75% stenosed.   Ost Cx to Prox Cx lesion is 90% stenosed.   Prox Cx to Mid Cx lesion is 60% stenosed.   Prox RCA to Dist RCA lesion is 100% stenosed.   Ost LAD lesion is 70% stenosed.   Origin to Prox Graft lesion is 100% stenosed.   2nd Mrg lesion is 80% stenosed.   Scoring balloon angioplasty was performed using a [place].   Post intervention, there is a 0% residual stenosis.   Origin lesion is 70% stenosed.   A drug-eluting  stent was successfully placed using a SYNERGY XD 4.0X12.   Post intervention, there is a 0% residual stenosis.   LIMA graft was visualized by angiography and is normal in caliber.   SVG graft was visualized by angiography.   The graft exhibits no disease.   The left ventricular systolic function is normal.   LV end diastolic pressure is normal.   The left ventricular ejection fraction is 55-65% by visual estimate.   3 vessel obstructive CAD Patent LIMA to the LAD Occluded SVG to the first diagonal. The native diagonal has good flow SVG to the OM2 is patent but has stenosis at the aorto-ostium and at the distal anastomosis. CTO of the RCA Good LV function Normal LVEDP Successful PCI of the SVG to OM2 with DES at the ostial SVG and scoring balloon angioplasty of the distal anastomosis.   Plan: DAPT with ASA and Plavix for one year. Do not resume Eliquis. Anticipate same day DC. Coronary Diagrams  Diagnostic Dominance: Right  Intervention  Implants     Assessment / Plan: 1. Coronary disease with remote angioplasty of the LAD and left circumflex coronary  in 1991. Chronic total occlusion of the right coronary. Repeat evaluation with cardiac cath in February 2020 was stable.   He presented in March with  unstable angina. Cardiac cath showed progressive multivessel CAD. He subsequently underwent CABG by Dr Roxan Hockey with LIMA to the LAD, SVG to diagonal and SVG to OM. It was noted that the RCA branches were too small to graft. Repeat cardiac cath showed occlusion of SVG to diagonal. There was significant stenosis at the ostium of SVG to OM as well as stenosis at the distal anastomosis. Both successfully treated with PCI. LIMA to LAD patent. On DAPT with ASA and Plavix. He still has some angina class 2. On Imdur and atenolol. If symptoms progress may add Ranexa again. For now we will monitor.   2. Hyperlipidemia. Continue on high-dose atorvastatin. LDL at goal 40.   3. Diabetes mellitus  type 2.  on metformin, Januvia, Jardiance and glimiperide. Needs to eliminate sugar/candy intake. Per primary care.   4. Tobacco abuse. Encourage complete smoking cessation.  5. Post op Afib. Converted on amiodarone. Intolerant of amiodarone due to GI side effects. Had recurrent Aflutter that converted spontaneously. Asymptomatic. No recurrence on event monitor. Off Eliquis now since arrhythmia resolved and now on DAPT.   Will follow up in 3 months.

## 2021-08-04 ENCOUNTER — Other Ambulatory Visit: Payer: Self-pay

## 2021-08-04 ENCOUNTER — Encounter: Payer: Self-pay | Admitting: Cardiology

## 2021-08-04 ENCOUNTER — Ambulatory Visit: Payer: Medicare Other | Admitting: Cardiology

## 2021-08-04 VITALS — BP 120/58 | HR 66 | Ht 64.5 in | Wt 144.6 lb

## 2021-08-04 DIAGNOSIS — Z720 Tobacco use: Secondary | ICD-10-CM

## 2021-08-04 DIAGNOSIS — E119 Type 2 diabetes mellitus without complications: Secondary | ICD-10-CM | POA: Diagnosis not present

## 2021-08-04 DIAGNOSIS — Z951 Presence of aortocoronary bypass graft: Secondary | ICD-10-CM | POA: Diagnosis not present

## 2021-08-04 DIAGNOSIS — E7800 Pure hypercholesterolemia, unspecified: Secondary | ICD-10-CM | POA: Diagnosis not present

## 2021-08-04 DIAGNOSIS — I25709 Atherosclerosis of coronary artery bypass graft(s), unspecified, with unspecified angina pectoris: Secondary | ICD-10-CM

## 2021-08-19 ENCOUNTER — Telehealth: Payer: Self-pay

## 2021-08-19 NOTE — Telephone Encounter (Signed)
Letter has been sent to patient instructing them to call us if they are still interested in completing their sleep study. If we have not received a response from the patient within 30 days of this notice, the order will be cancelled and they will need to discuss the need for a sleep study at their next office visit.  ° °

## 2021-08-20 ENCOUNTER — Telehealth: Payer: Self-pay | Admitting: Cardiology

## 2021-08-20 MED ORDER — RANOLAZINE ER 500 MG PO TB12: 500.0000 mg | ORAL_TABLET | Freq: Two times a day (BID) | ORAL | 6 refills | Status: DC

## 2021-08-20 NOTE — Telephone Encounter (Signed)
Spoke to patient he stated he continues to have cheat pain off and on.No chest pain at present.Stated he wanted to know if he needs to increase Isosorbide.He is presently taking 60 mg daily.Advised I will send message to [facility] for advice.

## 2021-08-20 NOTE — Telephone Encounter (Signed)
Spoke to patient Dr.Jordan's advice given.Advised to call back in 2 weeks to report symptoms.Advised to call sooner if needed.

## 2021-08-20 NOTE — Telephone Encounter (Signed)
Lets go ahead and start him on Ranexa 500 mg bid. If he continues to experience angina we can increase this to 1000 mg bid after 2 weeks.  Louis Ivery Martinique MD, [facility]

## 2021-08-20 NOTE — Telephone Encounter (Signed)
Pt c/o of Chest Pain: STAT if CP now or developed within 24 hours  1. Are you having CP right now? no  2. Are you experiencing any other symptoms (ex. SOB, nausea, vomiting, sweating)? no  3. How long have you been experiencing CP? For about a month  4. Is your CP continuous or coming and going? Comes and goes  5. Have you taken Nitroglycerin? States he takes it sometimes if the pain is bad   Patient states he is still getting chest pain since his cath and would like to know if any of his medications need to be increased. ?

## 2021-09-03 ENCOUNTER — Telehealth: Payer: Self-pay | Admitting: Cardiology

## 2021-09-03 NOTE — Telephone Encounter (Signed)
Spoke with patient regarding Ranexa dose. Advised patient that Dr. Martinique wants to increase the dose to 1000 mg twice a day. Patient has the 500 mg tablets and stated he understood to take two tablets twice a day. He stated he has refills for the 500 mg and will take 2 of those twice a day. Patient also voiced understanding to let the clinic know how this adjusted dose works for him.

## 2021-09-03 NOTE — Telephone Encounter (Signed)
Spoke with patient. He believes the Ranexa is not helping with angina. He had to use one ntg two different times over past 2 weeks. He wants to know if the Ranexa dose should be increased. Please advise.

## 2021-09-03 NOTE — Telephone Encounter (Signed)
Pt c/o medication issue:  1. Name of Medication:   isosorbide mononitrate (IMDUR) 60 MG 24 hr tablet  2. How are you currently taking this medication (dosage and times per day)? As directed  3. Are you having a reaction (difficulty breathing--STAT)?   4. What is your medication issue? Patient just said he feels like it is "not strong enough". He did not mention any cardiac symptoms at the time of the call. He wanted to talk to [facility] about getting a higher dose

## 2021-09-03 NOTE — Telephone Encounter (Signed)
I would increase Ranexa to 1000 mg bid and see how this goes  Brighton Delio Martinique MD, [facility]

## 2021-09-26 ENCOUNTER — Other Ambulatory Visit: Payer: Self-pay | Admitting: Cardiology

## 2021-10-21 DIAGNOSIS — E113293 Type 2 diabetes mellitus with mild nonproliferative diabetic retinopathy without macular edema, bilateral: Secondary | ICD-10-CM | POA: Diagnosis not present

## 2021-10-21 DIAGNOSIS — L570 Actinic keratosis: Secondary | ICD-10-CM | POA: Diagnosis not present

## 2021-10-21 DIAGNOSIS — E7800 Pure hypercholesterolemia, unspecified: Secondary | ICD-10-CM | POA: Diagnosis not present

## 2021-10-21 DIAGNOSIS — I2510 Atherosclerotic heart disease of native coronary artery without angina pectoris: Secondary | ICD-10-CM | POA: Diagnosis not present

## 2021-10-21 DIAGNOSIS — I10 Essential (primary) hypertension: Secondary | ICD-10-CM | POA: Diagnosis not present

## 2021-11-01 NOTE — Progress Notes (Signed)
Micahel De [name] Date of Birth: 01/31/1946 Medical Record #[id number]  History of Present Illness: Benjamin [name] is seen  for follow up s/p cardiac cath. He has a history of coronary disease with prior angioplasty of the proximal LAD in 1991. He also had angioplasty of the circumflex at that time. He has known occlusion of the right coronary with collateral flow. Last Myoview study in October 2015 showed a small anterolateral scar. No ischemia.    On a prior  visit he described 3 episodes of chest pain for which he took sl Ntg. Lasted 5-10 minutes. No clear triggers. One time he became diaphoretic.  He denies dyspnea or palpitation.  He had a Myoview study showing anterior ischemia. This led to a cardiac cath in February 2020 showing occluded RCA and moderate nonobstructive disease in the LAD and LCx. Unchanged from prior study. Medical therapy recommended.   He was on maximal antianginal therapy with  aspirin, atenolol, and isosorbide, Ranexa. He presented in March with  unstable angina. Cardiac cath showed progressive multivessel CAD. He subsequently underwent CABG by Dr Roxan Hockey with LIMA to the LAD, SVG to diagonal and SVG to OM. It was noted that the RCA branches were too small to graft. His post op course was fairly uneventful. He had Afib on post op day #3 but converted on amiodarone. He had post op anemia. When I saw him last we discontinued amiodarone due to Nausea and poor appetite. When seen by Dr Roxan Hockey on May 10 he was in Atrial flutter with rate 150. He was asymptomatic. Was seen in the Afib clinic and was back in NSR. Atenolol dose was increased. Event monitor was placed showing a single episode of SVT lasting 36 seconds.   He was seen on September 22 by Dr Percival Spanish for evaluation of exertional chest pain. He was started on Imdur and is seen back to evaluate response. He states that this helped about a week but then his pain returned. This is described as burning mid sternal pain that  radiates to his left jaw and arm. It is his typical angina. It is relieved with rest and sl Ntg. It is associated with exertion- just walking to his mailbox.   He underwent cardiac cath on 07/17/21. This showed patent LIMA to the LAD. The SVG to the first diagonal was occluded but the diagonal had good antegrade flow. The SVG to OM showed aorto-ostial stenosis as well as stenosis at the distal anastomosis. The ostial lesion was successfully stented and the distal lesion was treated with a cutting balloon with good result. Since his intervention he has noted improvement in his symptoms.   He does note symptoms of chest burning with exertion at times. Typically relieved with sl Ntg x 1. No dyspnea. Smoking a little.     Allergies as of 11/03/2021       Reactions   Sulfa Drugs Cross Reactors Rash        Medication List        Accurate as of November 03, 2021  3:46 PM. If you have any questions, ask your [name] or doctor.          STOP taking these medications    isosorbide mononitrate 60 MG 24 hr tablet Commonly known as: IMDUR Stopped by: Riaan Toledo Martinique, MD   ranolazine 500 MG 12 hr tablet Commonly known as: Ranexa Stopped by: Farrel Guimond Martinique, MD       TAKE these medications    acetaminophen 500 MG tablet  Commonly known as: TYLENOL Take 1,000 mg by mouth every 6 (six) hours as needed for moderate pain or headache.   ANTI-FUNGAL EX Apply 1 application topically daily at 12 noon. Jock itch   aspirin 81 MG EC tablet Take 1 tablet (81 mg total) by mouth daily.   atenolol 25 MG tablet Commonly known as: TENORMIN Take 1 tablet in the AM and 1/2 tablet in the PM   atorvastatin 80 MG tablet Commonly known as: LIPITOR TAKE 1 TABLET BY MOUTH DAILY   bisacodyl 5 MG EC tablet Commonly known as: DULCOLAX Take 5 mg by mouth daily as needed for moderate constipation or mild constipation.   clopidogrel 75 MG tablet Commonly known as: PLAVIX Take 1 tablet (75 mg total) by mouth  daily with breakfast.   Farxiga 10 MG Tabs tablet Generic drug: dapagliflozin propanediol Take 10 mg by mouth daily.   glimepiride 2 MG tablet Commonly known as: AMARYL TAKE 1 TABLET(2 MG) BY MOUTH DAILY WITH BREAKFAST   Januvia 100 MG tablet Generic drug: sitaGLIPtin Take 100 mg by mouth daily with lunch.   metFORMIN 1000 MG tablet Commonly known as: GLUCOPHAGE Take 1 tablet (1,000 mg total) by mouth 2 (two) times daily.   nitroGLYCERIN 0.4 MG SL tablet Commonly known as: Nitrostat Place 1 tablet (0.4 mg total) under the tongue every 5 (five) minutes as needed for chest pain.   OneTouch Delica Plus [id number] Misc Apply 1 each topically daily.         Allergies  Allergen Reactions   Sulfa Drugs Cross Reactors Rash    Past Medical History:  Diagnosis Date   Asthma    as a child, no problems as an adult, no inhaler   Coronary artery disease    Prior PCI to the prox LAD in 1991; S/P PCI to LCX in October 1991   Diabetes mellitus    TYPE 2   GERD (gastroesophageal reflux disease)    occasional - diet controlled   History of kidney stones    many yrs ago   Hyperlipidemia    Tobacco abuse    quit 09/2020 - smoked for 64 yrs    Past Surgical History:  Procedure Laterality Date   COLONOSCOPY WITH PROPOFOL N/A 04/21/2016   Procedure: COLONOSCOPY WITH PROPOFOL;  Surgeon: Garlan Fair, MD;  Location: [facility];  Service: Endoscopy;  Laterality: N/A;   CORONARY ANGIOPLASTY  05/1990   PROXIMAL LAD   CORONARY ANGIOPLASTY  06/1990   LEFT CIRCUMFLEX CORONARY   CORONARY ARTERY BYPASS GRAFT N/A 12/26/2020   Procedure: CORONARY ARTERY BYPASS GRAFTING (CABG), ON PUMP, TIMES THREE, USING LEFT INTERNAL MAMMARY ARTERY AND ENDOSCOPICALLY HARVESTED RIGHT GREATER SAPHENOUS VEIN;  Surgeon: Melrose Nakayama, MD;  Location: [place];  Service: Open Heart Surgery;  Laterality: N/A;   CORONARY STENT INTERVENTION N/A 07/17/2021   Procedure: CORONARY STENT INTERVENTION;  Surgeon:  Martinique, Shavy Beachem M, MD;  Location: [facility];  Service: Cardiovascular;  Laterality: N/A;   KIDNEY STONE SURGERY Bilateral    x 2 surgeries   LEFT HEART CATH AND CORONARY ANGIOGRAPHY N/A 11/18/2018   Procedure: LEFT HEART CATH AND CORONARY ANGIOGRAPHY;  Surgeon: Martinique, Erisha Paugh M, MD;  Location: [facility];  Service: Cardiovascular;  Laterality: N/A;   LEFT HEART CATH AND CORONARY ANGIOGRAPHY N/A 12/17/2020   Procedure: LEFT HEART CATH AND CORONARY ANGIOGRAPHY;  Surgeon: Martinique, Chenay Nesmith M, MD;  Location: [facility];  Service: Cardiovascular;  Laterality: N/A;  LEFT HEART CATH AND CORS/GRAFTS ANGIOGRAPHY N/A 07/17/2021   Procedure: LEFT HEART CATH AND CORS/GRAFTS ANGIOGRAPHY;  Surgeon: Martinique, Meribeth Vitug M, MD;  Location: [facility];  Service: Cardiovascular;  Laterality: N/A;   TEE WITHOUT CARDIOVERSION N/A 12/26/2020   Procedure: TRANSESOPHAGEAL ECHOCARDIOGRAM (TEE);  Surgeon: Melrose Nakayama, MD;  Location: [facility];  Service: Open Heart Surgery;  Laterality: N/A;   UMBILICAL HERNIA REPAIR      Social History   Tobacco Use  Smoking Status Former   Packs/day: 1.00   Years: 64.00   Pack years: 64.00   Types: Cigarettes   Quit date: 09/28/2020   Years since quitting: 1.0  Smokeless Tobacco Never    Social History   Substance and Sexual Activity  Alcohol Use Yes   Alcohol/week: 3.0 - 4.0 standard drinks   Types: 3 - 4 Standard drinks or equivalent per week   Comment: every other weekend - liquor    Family History  Problem Relation Age of Onset   Heart disease Sister        had open heart surgery    Review of Systems: As noted in history of present illness.  All other systems were reviewed and are negative.  Physical Exam: BP 124/70    Pulse 63    Ht 5' 4.5" (1.638 m)    Wt 141 lb 9.6 oz (64.2 kg)    SpO2 98%    BMI 23.93 kg/m  GENERAL:  Well appearing WM in NAD HEENT:  PERRL, EOMI, sclera are clear. Oropharynx is clear. NECK:  No jugular venous  distention, carotid upstroke brisk and symmetric, no bruits, no thyromegaly or adenopathy LUNGS:  Clear to auscultation bilaterally CHEST:  Incisions healing well.  HEART:  RRR,  PMI not displaced or sustained,S1 and S2 within normal limits, no S3, no S4: no clicks, no rubs, no murmurs ABD:  Soft, nontender. BS +, no masses or bruits. No hepatomegaly, no splenomegaly EXT:  2 + pulses throughout, no edema, no cyanosis no clubbing.  SKIN:  Warm and dry.  No rashes NEURO:  Alert and oriented x 3. Cranial nerves II through XII intact. PSYCH:  Cognitively intact   LABORATORY DATA:  Lab Results  Component Value Date   WBC 6.7 07/14/2021   HGB 11.9 (L) 07/14/2021   HCT 38.3 07/14/2021   PLT 192 07/14/2021   GLUCOSE 151 (H) 07/14/2021   CHOL 103 07/14/2021   TRIG 53 07/14/2021   HDL 50 07/14/2021   LDLCALC 40 07/14/2021   ALT 22 06/19/2021   AST 20 06/19/2021   NA 137 07/14/2021   K 4.7 07/14/2021   CL 102 07/14/2021   CREATININE 0.74 (L) 07/14/2021   BUN 12 07/14/2021   CO2 24 07/14/2021   TSH 2.030 06/19/2021   INR 1.6 (H) 12/26/2020   HGBA1C 8.5 (H) 12/25/2020   Labs dated 08/05/16: cholesterol 115, triglycerides 56, LDL 49, HDL 56. A1c 7.4%. CMET normal. Dated 08/30/17: cholesterol 120, triglycerides 58, HDL 46, LDL 62. A1c 7.8%. Chemistries and CBC normal.  Dated 09/16/18: cholesterol 106, triglycerides 51, HDL 48, LDL48. A1c 7.4%. CMET normal Dated 03/27/19: A1c 7.5%. BMET normal Dated 10/19/19: cholesterol 119, triglycerides 52, HDL 51, LDL 56, CMET normal Dated 04/10/20: A1c 6.8%.  Dated 10/10/20: cholesterol 123, triglycerides 56, HDL 55, LDL 56. A1c 7.1%. CMET normal.  Dated 04/18/21: A1c 8.6%   Myoview 11/09/18: Study Highlights     The left ventricular ejection fraction is normal (55-65%). Nuclear stress EF: 59%.  Blood pressure demonstrated a normal response to exercise. There was no ST segment deviation noted during stress. No T wave inversion was noted during  stress. Defect 1: There is a small defect of moderate severity present in the mid anterolateral, apical lateral and apex location. Findings consistent with ischemia. This is an intermediate risk study.   The anterolateral perfusion defect on the prior exam was interpreted as fixed, however on today's exam appears to have reversible ischemia. Wall motion appears abnormal in this region.     Cardiac cath 11/18/18:  LEFT HEART CATH AND CORONARY ANGIOGRAPHY  Conclusion     Prox LAD lesion is 45% stenosed. Prox LAD to Mid LAD lesion is 65% stenosed. Ost Cx to Prox Cx lesion is 50% stenosed. Prox Cx to Mid Cx lesion is 50% stenosed. Prox RCA to Dist RCA lesion is 100% stenosed. The left ventricular systolic function is normal. LV end diastolic pressure is normal. The left ventricular ejection fraction is 55-65% by visual estimate.   1. Single vessel occlusive CAD with CTO of the RCA. Left to right collaterals. There is diffuse moderate calcific disease involving the LAD and LCx that is not significantly changed since 2011. 2. Normal LV function 3. Normal LV EDP   Plan: continued medical therapy   Cardiac cath 12/17/20:  LEFT HEART CATH AND CORONARY ANGIOGRAPHY    Conclusion    Prox LAD lesion is 45% stenosed. Prox LAD to Mid LAD lesion is 75% stenosed. Ost Cx to Prox Cx lesion is 90% stenosed. Prox Cx to Mid Cx lesion is 60% stenosed. Prox RCA to Dist RCA lesion is 100% stenosed. Ost LM to Mid LM lesion is 30% stenosed. Ost LAD lesion is 80% stenosed. The left ventricular systolic function is normal. LV end diastolic pressure is normal. The left ventricular ejection fraction is 55-65% by visual estimate.   1. Severe 3 vessel obstructive CAD. Patient has CTO of the RCA. Progressive ostial LAD and LCX disease. Severely calcified vessels 2. Normal LV function 3. Normal LVEDP   Plan: recommend referral to CT surgery for consideration of CABG.    Coronary  Diagrams   Diagnostic Dominance: Right    Intervention   Intraop TEE 2/63/33: Complications: No known complications during this procedure.  POST-OP IMPRESSIONS  - Left Ventricle: The left ventricle is unchanged from pre-bypass.  - Right Ventricle: The right ventricle appears unchanged from pre-bypass.  - Aorta: The aorta appears unchanged from pre-bypass.  - Left Atrium: The left atrium appears unchanged from pre-bypass.  - Left Atrial Appendage: The left atrial appendage appears unchanged from  pre-bypass.  - Aortic Valve: The aortic valve appears unchanged from pre-bypass.  - Mitral Valve: The mitral valve appears unchanged from pre-bypass.  - Tricuspid Valve: The tricuspid valve appears unchanged from pre-bypass.  - Interatrial Septum: The interatrial septum appears unchanged from  pre-bypass.  - Interventricular Septum: The interventricular septum appears unchanged  from  pre-bypass.  - Pericardium: The pericardium appears unchanged from pre-bypass.   PRE-OP FINDINGS   Left Ventricle: The left ventricle has normal systolic function, with an  ejection fraction of 55-60%. The cavity size was normal. There is no left  ventricular hypertrophy.    Right Ventricle: The right ventricle has normal systolic function. The  cavity was normal. There is no increase in right ventricular wall  thickness.   Left Atrium: Left atrial size was normal in size. No left atrial/left  atrial appendage thrombus was detected.   Right Atrium: Right atrial  size was normal in size.   Interatrial Septum: No atrial level shunt detected by color flow Doppler.   Pericardium: There is no evidence of pericardial effusion.   Mitral Valve: The mitral valve is normal in structure. Mitral valve  regurgitation is not visualized by color flow Doppler. There is No  evidence of mitral stenosis.   Tricuspid Valve: The tricuspid valve was normal in structure. Tricuspid  valve regurgitation was not  visualized by color flow Doppler.   Aortic Valve: The aortic valve is tricuspid Aortic valve regurgitation was  not visualized by color flow Doppler. There is no stenosis of the aortic  valve.    Pulmonic Valve: The pulmonic valve was not assessed.  Pulmonic valve regurgitation was not assessed by color flow Doppler.       Renold Don MD   Event monitor 03/04/21: Study Highlights    Patch Wear Time:  14 days and 0 hours (2022-05-18T15:15:55-0400 to 2022-06-01T15:15:59-0400)   Patient had a min HR of 49 bpm, max HR of 167 bpm, and avg HR of 67 bpm. Predominant underlying rhythm was Sinus Rhythm. 2 Ventricular Tachycardia runs occurred, the run with the fastest interval lasting 4 beats with a max rate of 156 bpm, the longest lasting 4 beats with an avg rate of 147 bpm. 50 Supraventricular Tachycardia runs occurred, the run with the fastest interval lasting 36.3 secs with a max rate of 167 bpm (avg 150 bpm); the run with the fastest interval was also the longest. Isolated SVEs were frequent (5.5%, 16109), SVE Couplets were rare (<1.0%, 3497), and SVE Triplets were rare (<1.0%, 910). Isolated VEs were rare (<1.0%, 1272), VE Triplets were rare (<1.0%, 3), and no VE Couplets were present. Ventricular Trigeminy was present.  Cardiac cath 07/17/21:  CORONARY STENT INTERVENTION  LEFT HEART CATH AND CORS/GRAFTS ANGIOGRAPHY   Conclusion      Ost LM to Mid LM lesion is 30% stenosed.   Prox LAD lesion is 45% stenosed.   Prox LAD to Mid LAD lesion is 75% stenosed.   Ost Cx to Prox Cx lesion is 90% stenosed.   Prox Cx to Mid Cx lesion is 60% stenosed.   Prox RCA to Dist RCA lesion is 100% stenosed.   Ost LAD lesion is 70% stenosed.   Origin to Prox Graft lesion is 100% stenosed.   2nd Mrg lesion is 80% stenosed.   Scoring balloon angioplasty was performed using a [place].   Post intervention, there is a 0% residual stenosis.   Origin lesion is 70% stenosed.   A drug-eluting  stent was successfully placed using a SYNERGY XD 4.0X12.   Post intervention, there is a 0% residual stenosis.   LIMA graft was visualized by angiography and is normal in caliber.   SVG graft was visualized by angiography.   The graft exhibits no disease.   The left ventricular systolic function is normal.   LV end diastolic pressure is normal.   The left ventricular ejection fraction is 55-65% by visual estimate.   3 vessel obstructive CAD Patent LIMA to the LAD Occluded SVG to the first diagonal. The native diagonal has good flow SVG to the OM2 is patent but has stenosis at the aorto-ostium and at the distal anastomosis. CTO of the RCA Good LV function Normal LVEDP Successful PCI of the SVG to OM2 with DES at the ostial SVG and scoring balloon angioplasty of the distal anastomosis.   Plan: DAPT with ASA and Plavix for one year. Do  not resume Eliquis. Anticipate same day DC. Coronary Diagrams  Diagnostic Dominance: Right Intervention  Implants     Assessment / Plan: 1. Coronary disease with remote angioplasty of the LAD and left circumflex coronary  in 1991. Chronic total occlusion of the right coronary. Repeat evaluation with cardiac cath in February 2020 was stable.   He presented in March with  unstable angina. Cardiac cath showed progressive multivessel CAD. He subsequently underwent CABG by Dr Roxan Hockey with LIMA to the LAD, SVG to diagonal and SVG to OM. It was noted that the RCA branches were too small to graft. Repeat cardiac cath showed occlusion of SVG to diagonal. There was significant stenosis at the ostium of SVG to OM as well as stenosis at the distal anastomosis. Both successfully treated with PCI. LIMA to LAD patent. On DAPT with ASA and Plavix. He still has some angina class 2. On Imdur, ranexa and atenolol. Will increase Imdur 120 mg daily.   2. Hyperlipidemia. Continue on high-dose atorvastatin. LDL at goal 40.   3. Diabetes mellitus type 2.  on metformin,  Januvia, Jardiance and glimiperide. Needs to eliminate sugar/candy intake. Per primary care.   4. Tobacco abuse. Encourage complete smoking cessation.  5. Post op Afib. Converted on amiodarone. Intolerant of amiodarone due to GI side effects. Had recurrent Aflutter that converted spontaneously. Asymptomatic. No recurrence on event monitor.   Will follow up in 6 months.

## 2021-11-03 ENCOUNTER — Ambulatory Visit: Payer: Medicare Other | Admitting: Cardiology

## 2021-11-03 ENCOUNTER — Encounter: Payer: Self-pay | Admitting: Cardiology

## 2021-11-03 ENCOUNTER — Other Ambulatory Visit: Payer: Self-pay

## 2021-11-03 VITALS — BP 124/70 | HR 63 | Ht 64.5 in | Wt 141.6 lb

## 2021-11-03 DIAGNOSIS — I25709 Atherosclerosis of coronary artery bypass graft(s), unspecified, with unspecified angina pectoris: Secondary | ICD-10-CM | POA: Diagnosis not present

## 2021-11-03 DIAGNOSIS — E119 Type 2 diabetes mellitus without complications: Secondary | ICD-10-CM | POA: Diagnosis not present

## 2021-11-03 DIAGNOSIS — E7800 Pure hypercholesterolemia, unspecified: Secondary | ICD-10-CM

## 2021-11-03 DIAGNOSIS — E785 Hyperlipidemia, unspecified: Secondary | ICD-10-CM | POA: Diagnosis not present

## 2021-11-03 DIAGNOSIS — Z951 Presence of aortocoronary bypass graft: Secondary | ICD-10-CM | POA: Diagnosis not present

## 2021-11-03 DIAGNOSIS — Z720 Tobacco use: Secondary | ICD-10-CM | POA: Diagnosis not present

## 2021-11-03 MED ORDER — ISOSORBIDE MONONITRATE ER 120 MG PO TB24: 120.0000 mg | ORAL_TABLET | Freq: Every day | ORAL | 3 refills | Status: DC

## 2021-11-03 MED ORDER — RANOLAZINE ER 1000 MG PO TB12: 1000.0000 mg | ORAL_TABLET | Freq: Two times a day (BID) | ORAL | 3 refills | Status: DC

## 2021-11-03 NOTE — Patient Instructions (Signed)
Continue Ranexa 1000 mg twice a day  Increase Imdur to 120 mg daily  Continue your other therapy  Use Ntg as needed for chest pain.

## 2021-11-19 DIAGNOSIS — L570 Actinic keratosis: Secondary | ICD-10-CM | POA: Diagnosis not present

## 2021-11-19 DIAGNOSIS — C4442 Squamous cell carcinoma of skin of scalp and neck: Secondary | ICD-10-CM | POA: Diagnosis not present

## 2021-11-19 DIAGNOSIS — D044 Carcinoma in situ of skin of scalp and neck: Secondary | ICD-10-CM | POA: Diagnosis not present

## 2021-12-13 ENCOUNTER — Other Ambulatory Visit: Payer: Self-pay | Admitting: Cardiology

## 2022-01-12 DIAGNOSIS — L570 Actinic keratosis: Secondary | ICD-10-CM | POA: Diagnosis not present

## 2022-01-12 DIAGNOSIS — L821 Other seborrheic keratosis: Secondary | ICD-10-CM | POA: Diagnosis not present

## 2022-01-12 DIAGNOSIS — C44622 Squamous cell carcinoma of skin of right upper limb, including shoulder: Secondary | ICD-10-CM | POA: Diagnosis not present

## 2022-01-12 DIAGNOSIS — Z85828 Personal history of other malignant neoplasm of skin: Secondary | ICD-10-CM | POA: Diagnosis not present

## 2022-01-12 DIAGNOSIS — D0461 Carcinoma in situ of skin of right upper limb, including shoulder: Secondary | ICD-10-CM | POA: Diagnosis not present

## 2022-03-09 ENCOUNTER — Telehealth (HOSPITAL_COMMUNITY): Payer: Self-pay

## 2022-03-09 NOTE — Telephone Encounter (Signed)
Pt insurance is active and benefits verified through [facility] Medicare Co-pay 0, DED 0/0 met, out of pocket $3,600/$60 met, co-insurance 0%. no pre-authorization required. Passport, 03/09/2022'@3' :34pm, REF# [id number]   How many CR sessions are covered? (36 sessions for TCR, 72 sessions for ICR)72 Is this a lifetime maximum or an annual maximum? lifetime Has the member used any of these services to date? no Is there a time limit (weeks/months) on start of program and/or program completion? no

## 2022-04-07 ENCOUNTER — Other Ambulatory Visit: Payer: Self-pay

## 2022-04-07 NOTE — Progress Notes (Signed)
Amb ref

## 2022-04-09 ENCOUNTER — Other Ambulatory Visit: Payer: Self-pay | Admitting: Cardiology

## 2022-04-13 DIAGNOSIS — L821 Other seborrheic keratosis: Secondary | ICD-10-CM | POA: Diagnosis not present

## 2022-04-13 DIAGNOSIS — Z85828 Personal history of other malignant neoplasm of skin: Secondary | ICD-10-CM | POA: Diagnosis not present

## 2022-04-13 DIAGNOSIS — L570 Actinic keratosis: Secondary | ICD-10-CM | POA: Diagnosis not present

## 2022-04-21 ENCOUNTER — Telehealth (HOSPITAL_COMMUNITY): Payer: Self-pay | Admitting: *Deleted

## 2022-04-21 NOTE — Telephone Encounter (Signed)
Cardiac rehabilitation cardiac risk profile nursing assessment completed with patient, patient optimistic about beginning program, no further questions or concern at this time.  Albertine Grates RN

## 2022-04-22 DIAGNOSIS — E113293 Type 2 diabetes mellitus with mild nonproliferative diabetic retinopathy without macular edema, bilateral: Secondary | ICD-10-CM | POA: Diagnosis not present

## 2022-04-22 DIAGNOSIS — I2510 Atherosclerotic heart disease of native coronary artery without angina pectoris: Secondary | ICD-10-CM | POA: Diagnosis not present

## 2022-04-22 DIAGNOSIS — Z0000 Encounter for general adult medical examination without abnormal findings: Secondary | ICD-10-CM | POA: Diagnosis not present

## 2022-04-22 DIAGNOSIS — E7800 Pure hypercholesterolemia, unspecified: Secondary | ICD-10-CM | POA: Diagnosis not present

## 2022-04-23 ENCOUNTER — Encounter (HOSPITAL_COMMUNITY)
Admission: RE | Admit: 2022-04-23 | Discharge: 2022-04-23 | Disposition: A | Discharge status: Home / Self Care | Payer: Medicare Other | Source: Ambulatory Visit | Attending: Cardiology | Admitting: Cardiology

## 2022-04-23 ENCOUNTER — Encounter (HOSPITAL_COMMUNITY): Payer: Self-pay

## 2022-04-23 VITALS — BP 107/57 | HR 53 | Ht 63.5 in | Wt 134.7 lb

## 2022-04-23 DIAGNOSIS — Z48812 Encounter for surgical aftercare following surgery on the circulatory system: Secondary | ICD-10-CM | POA: Diagnosis not present

## 2022-04-23 DIAGNOSIS — Z955 Presence of coronary angioplasty implant and graft: Secondary | ICD-10-CM | POA: Diagnosis not present

## 2022-04-23 DIAGNOSIS — E119 Type 2 diabetes mellitus without complications: Secondary | ICD-10-CM | POA: Diagnosis not present

## 2022-04-23 LAB — GLUCOSE, CAPILLARY: Glucose-Capillary: 175 mg/dL — ABNORMAL HIGH (ref 70–99)

## 2022-04-23 NOTE — Progress Notes (Signed)
Cardiac Rehab Medication Review by a Nurse  Does the patient  feel that his/her medications are working for him/her?  YES   Has the patient been experiencing any side effects to the medications prescribed?   NO  Does the patient measure his/her own blood pressure or blood glucose at home?  YES   Does the patient have any problems obtaining medications due to transportation or finances?   NO  Understanding of regimen: good Understanding of indications: fair Potential of compliance: good    Nurse comments: Benjamin [name] is taking his medications as prescribed. Benjamin [name] checks his CBG's daily.    Christa See Nanda Bittick RN 04/23/2022 5:18 PM

## 2022-04-23 NOTE — Progress Notes (Signed)
Cardiac Individual Treatment Plan  Patient Details  Name: Benjamin [name] MRN: [id number] Date of Birth: 12-27-1945 Referring Provider:   Flowsheet Row INTENSIVE CARDIAC REHAB ORIENT from 04/23/2022 in [place]  Referring Provider Dr. Peter Martinique MD       Initial Encounter Date:  [place] from 04/23/2022 in [facility]  Date 04/23/22       Visit Diagnosis: 07/17/21 S/P PCI/ DES Ostial SVG  Patient's Home Medications on Admission:  Current Outpatient Medications:    acetaminophen (TYLENOL) 500 MG tablet, Take 1,000 mg by mouth every 6 (six) hours as needed for moderate pain or headache., Disp: , Rfl:    aspirin 81 MG EC tablet, Take 1 tablet (81 mg total) by mouth daily., Disp: , Rfl:    atenolol (TENORMIN) 25 MG tablet, TAKE 1 TABLET BY MOUTH EVERY MORNING AND ONE-HALF TABLET IN THE EVENING, Disp: 135 tablet, Rfl: 3   atorvastatin (LIPITOR) 80 MG tablet, TAKE 1 TABLET BY MOUTH DAILY, Disp: 90 tablet, Rfl: 3   clopidogrel (PLAVIX) 75 MG tablet, Take 1 tablet (75 mg total) by mouth daily with breakfast., Disp: 30 tablet, Rfl: 11   docusate sodium (COLACE) 50 MG capsule, Take 50 mg by mouth at bedtime., Disp: , Rfl:    FARXIGA 10 MG TABS tablet, Take 10 mg by mouth in the morning., Disp: , Rfl:    glimepiride (AMARYL) 2 MG tablet, TAKE 1 TABLET(2 MG) BY MOUTH DAILY WITH BREAKFAST, Disp: 30 tablet, Rfl: 11   isosorbide mononitrate (IMDUR) 120 MG 24 hr tablet, Take 1 tablet (120 mg total) by mouth daily., Disp: 90 tablet, Rfl: 3   JANUVIA 100 MG tablet, Take 100 mg by mouth in the morning., Disp: , Rfl:    metFORMIN (GLUCOPHAGE) 1000 MG tablet, Take 1 tablet (1,000 mg total) by mouth 2 (two) times daily., Disp: , Rfl:    nitroGLYCERIN (NITROSTAT) 0.4 MG SL tablet, Place 1 tablet (0.4 mg total) under the tongue every 5 (five) minutes as needed for chest pain., Disp: 25 tablet, Rfl: 2    ranolazine (RANEXA) 1000 MG SR tablet, Take 1 tablet (1,000 mg total) by mouth 2 (two) times daily., Disp: 180 tablet, Rfl: 3   Tolnaftate (ANTI-FUNGAL EX), Apply 1 application topically daily at 12 noon. Jock itch, Disp: , Rfl:    Lancets (ONETOUCH DELICA PLUS [id number]) MISC, Apply 1 each topically daily., Disp: , Rfl:   Past Medical History: Past Medical History:  Diagnosis Date   Asthma    as a child, no problems as an adult, no inhaler   Coronary artery disease    Prior PCI to the prox LAD in 1991; S/P PCI to LCX in October 1991   Diabetes mellitus    TYPE 2   GERD (gastroesophageal reflux disease)    occasional - diet controlled   History of kidney stones    many yrs ago   Hyperlipidemia    Tobacco abuse    quit 09/2020 - smoked for 64 yrs    Tobacco Use: Social History   Tobacco Use  Smoking Status Former   Packs/day: 1.00   Years: 64.00   Total pack years: 64.00   Types: Cigarettes   Quit date: 09/28/2020   Years since quitting: 1.5  Smokeless Tobacco Never    Labs: Review Flowsheet       Latest Ref Rng & Units 12/13/2020 12/25/2020 12/26/2020 07/14/2021  Labs for  [facility]P Cardiac and Pulmonary Rehab  Cholestrol 100 - 199 mg/dL 129  - - 103   LDL (calc) 0 - 99 mg/dL 49  - - 40   HDL-C >39 mg/dL 64  - - 50   Trlycerides 0 - 149 mg/dL 80  - - 53   Hemoglobin A1c 4.8 - 5.6 % - 8.5  - -  PH, Arterial 7.350 - 7.450 - 7.403  7.375  7.378  7.265  7.343  7.364  7.398  7.291  -  PCO2 arterial 32.0 - 48.0 mmHg - 32.1  38.3  36.8  45.4  37.7  41.7  34.4  41.1  -  Bicarbonate 20.0 - 28.0 mmol/L - 19.6  22.8  21.9  21.0  20.5  23.8  21.2  21.7  19.8  -  TCO2 22 - 32 mmol/L - - '24  23  22  22  23  24  25  22  23  22  21  21  '$ -  Acid-base deficit 0.0 - 2.0 mmol/L - 4.3  3.0  3.0  6.0  5.0  2.0  3.0  4.0  6.0  -  O2 Saturation % - 97.9  99.0  99.0  98.0  100.0  100.0  100.0  69.0  99.0  -    Capillary Blood Glucose: Lab Results  Component Value Date   GLUCAP 175 (H) 04/23/2022    GLUCAP 113 (H) 07/17/2021   GLUCAP 185 (H) 07/17/2021   GLUCAP 171 (H) 01/02/2021   GLUCAP 145 (H) 01/01/2021     Exercise Target Goals: Exercise Program Goal: Individual exercise prescription set using results from initial 6 min walk test and THRR while considering  patient's activity barriers and safety.   Exercise Prescription Goal: Initial exercise prescription builds to 30-45 minutes a day of aerobic activity, 2-3 days per week.  Home exercise guidelines will be given to patient during program as part of exercise prescription that the participant will acknowledge.  Activity Barriers & Risk Stratification:  Activity Barriers & Cardiac Risk Stratification - 04/23/22 1625       Activity Barriers & Cardiac Risk Stratification   Activity Barriers Joint Problems;Back Problems;Balance Concerns;Shortness of Breath;Deconditioning    Cardiac Risk Stratification High             6 Minute Walk:  6 Minute Walk     Row Name 04/23/22 1624         6 Minute Walk   Phase Initial     Distance 1200 feet     Walk Time 6 minutes     # of Rest Breaks 0     MPH 2.27     METS 2.19     RPE 11     Perceived Dyspnea  0     VO2 Peak 7.66     Symptoms No     Resting HR 53 bpm     Resting BP 107/57     Resting Oxygen Saturation  97 %     Exercise Oxygen Saturation  during 6 min walk 96 %     Max Ex. HR 74 bpm     Max Ex. BP 123/60     2 Minute Post BP 118/67              Oxygen Initial Assessment:   Oxygen Re-Evaluation:   Oxygen Discharge (Final Oxygen Re-Evaluation):   Initial Exercise Prescription:  Initial Exercise Prescription - 04/23/22 1600  Date of Initial Exercise RX and Referring Provider   Date 04/23/22    Referring Provider Dr. Peter Martinique MD    Expected Discharge Date 06/26/22      Recumbant Bike   Level 2    Minutes 15    METs 1.8      NuStep   Level 2    SPM 75    Minutes 15    METs 1.8      Prescription Details   Frequency  (times per week) 3    Duration Progress to 30 minutes of continuous aerobic without signs/symptoms of physical distress      Intensity   THRR 40-80% of Max Heartrate 57-114    Ratings of Perceived Exertion 11-13    Perceived Dyspnea 0-4      Progression   Progression Continue progressive overload as per policy without signs/symptoms or physical distress.      Resistance Training   Training Prescription Yes    Weight 3    Reps 10-15             Perform Capillary Blood Glucose checks as needed.  Exercise Prescription Changes:   Exercise Comments:   Exercise Goals and Review:   Exercise Goals     Row Name 04/23/22 1628             Exercise Goals   Increase Physical Activity Yes       Intervention Provide advice, education, support and counseling about physical activity/exercise needs.;Develop an individualized exercise prescription for aerobic and resistive training based on initial evaluation findings, risk stratification, comorbidities and participant's personal goals.       Expected Outcomes Short Term: Attend rehab on a regular basis to increase amount of physical activity.;Long Term: Add in home exercise to make exercise part of routine and to increase amount of physical activity.;Long Term: Exercising regularly at least 3-5 days a week.       Increase Strength and Stamina Yes       Intervention Provide advice, education, support and counseling about physical activity/exercise needs.;Develop an individualized exercise prescription for aerobic and resistive training based on initial evaluation findings, risk stratification, comorbidities and participant's personal goals.       Expected Outcomes Short Term: Increase workloads from initial exercise prescription for resistance, speed, and METs.;Short Term: Perform resistance training exercises routinely during rehab and add in resistance training at home;Long Term: Improve cardiorespiratory fitness, muscular endurance and  strength as measured by increased METs and functional capacity (6MWT)       Able to understand and use rate of perceived exertion (RPE) scale Yes       Intervention Provide education and explanation on how to use RPE scale       Expected Outcomes Short Term: Able to use RPE daily in rehab to express subjective intensity level;Long Term:  Able to use RPE to guide intensity level when exercising independently       Knowledge and understanding of Target Heart Rate Range (THRR) Yes       Intervention Provide education and explanation of THRR including how the numbers were predicted and where they are located for reference       Expected Outcomes Short Term: Able to state/look up THRR;Long Term: Able to use THRR to govern intensity when exercising independently;Short Term: Able to use daily as guideline for intensity in rehab       Understanding of Exercise Prescription Yes       Intervention Provide education, explanation,  and written materials on patient's individual exercise prescription       Expected Outcomes Short Term: Able to explain program exercise prescription;Long Term: Able to explain home exercise prescription to exercise independently                Exercise Goals Re-Evaluation :   Discharge Exercise Prescription (Final Exercise Prescription Changes):   Nutrition:  Target Goals: Understanding of nutrition guidelines, daily intake of sodium '1500mg'$ , cholesterol '200mg'$ , calories 30% from fat and 7% or less from saturated fats, daily to have 5 or more servings of fruits and vegetables.  Biometrics:  Pre Biometrics - 04/23/22 1623       Pre Biometrics   Waist Circumference 36.5 inches    Hip Circumference 37 inches    Waist to Hip Ratio 0.99 %    Triceps Skinfold 5 mm    % Body Fat 20.8 %    Grip Strength 26 kg    Flexibility 5 in    Single Leg Stand 1.9 seconds              Nutrition Therapy Plan and Nutrition Goals:   Nutrition Assessments:  MEDIFICTS Score  Key: ?70 Need to make dietary changes  40-70 Heart Healthy Diet ? 40 Therapeutic Level Cholesterol Diet    Picture Your Plate Scores: <29 Unhealthy dietary pattern with much room for improvement. 41-50 Dietary pattern unlikely to meet recommendations for good health and room for improvement. 51-60 More healthful dietary pattern, with some room for improvement.  >60 Healthy dietary pattern, although there may be some specific behaviors that could be improved.    Nutrition Goals Re-Evaluation:   Nutrition Goals Re-Evaluation:   Nutrition Goals Discharge (Final Nutrition Goals Re-Evaluation):   Psychosocial: Target Goals: Acknowledge presence or absence of significant depression and/or stress, maximize coping skills, provide positive support system. Participant is able to verbalize types and ability to use techniques and skills needed for reducing stress and depression.  Initial Review & Psychosocial Screening:  Initial Psych Review & Screening - 04/23/22 1721       Initial Review   Current issues with None Identified      Family Dynamics   Good Support System? Yes   Khaleed has his wife and 2 children and two children for support     Barriers   Psychosocial barriers to participate in program There are no identifiable barriers or psychosocial needs.      Screening Interventions   Interventions Encouraged to exercise             Quality of Life Scores:  Quality of Life - 04/23/22 1426       Quality of Life   Select Quality of Life      Quality of Life Scores   Health/Function Pre 17.7 %    Socioeconomic Pre 29.58 %    Psych/Spiritual Pre 23.79 %    Family Pre 23.5 %    GLOBAL Pre 22.03 %            Scores of 19 and below usually indicate a poorer quality of life in these areas.  A difference of  2-3 points is a clinically meaningful difference.  A difference of 2-3 points in the total score of the Quality of Life Index has been associated with significant  improvement in overall quality of life, self-image, physical symptoms, and general health in studies assessing change in quality of life.  PHQ-9: Review Flowsheet       04/23/2022  Depression screen PHQ 2/9  Decreased Interest 0  Down, Depressed, Hopeless 0  PHQ - 2 Score 0   Interpretation of Total Score  Total Score Depression Severity:  1-4 = Minimal depression, 5-9 = Mild depression, 10-14 = Moderate depression, 15-19 = Moderately severe depression, 20-27 = Severe depression   Psychosocial Evaluation and Intervention:   Psychosocial Re-Evaluation:   Psychosocial Discharge (Final Psychosocial Re-Evaluation):   Vocational Rehabilitation: Provide vocational rehab assistance to qualifying candidates.   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 Participant will state understanding/return demonstration of topics presented.     Core Videos: Exercise    Move It!  Clinical staff conducted group or individual video education with verbal and written material and guidebook.  Patient learns the recommended Pritikin exercise program. Exercise with the goal of living a long, healthy life. Some of the health benefits of exercise include controlled diabetes, healthier blood pressure levels, improved cholesterol levels, improved heart and lung capacity, improved sleep, and better body composition. Everyone should speak with their doctor before starting or changing an exercise routine.  Biomechanical Limitations Clinical staff conducted group or individual video education with verbal and written material and guidebook.  Patient learns how  biomechanical limitations can impact exercise and how we can mitigate and possibly overcome limitations to have an impactful and balanced exercise routine.  Body Composition Clinical staff conducted group or individual video education with verbal and written material and guidebook.  Patient learns that body composition (ratio of muscle mass to fat mass) is a key component to assessing overall fitness, rather than body weight alone. Increased fat mass, especially visceral belly fat, can put [place] at increased risk for metabolic syndrome, type 2 diabetes, heart disease, and even death. It is recommended to combine diet and exercise (cardiovascular and resistance training) to improve your body composition. Seek guidance from your physician and exercise physiologist before implementing an exercise routine.  Exercise Action Plan Clinical staff conducted group or individual video education with verbal and written material and guidebook.  Patient learns the recommended strategies to achieve and enjoy long-term exercise adherence, including variety, self-motivation, self-efficacy, and positive decision making. Benefits of exercise include fitness, good health, weight management, more energy, better sleep, less stress, and overall well-being.  Medical   Heart Disease Risk Reduction Clinical staff conducted group or individual video education with verbal and written material and guidebook.  Patient learns our heart is our most vital organ as it circulates oxygen, nutrients, white blood cells, and hormones throughout the entire body, and carries waste away. Data supports a plant-based eating plan like the Pritikin Program for its effectiveness in slowing progression of and reversing heart disease. The video provides a number of recommendations to address heart disease.   Metabolic Syndrome and Belly Fat  Clinical staff conducted group or individual video education with verbal and written material and guidebook.   Patient learns what metabolic syndrome is, how it leads to heart disease, and how one can reverse it and keep it from coming back. You have metabolic syndrome if you have 3 of the following 5 criteria: abdominal obesity, high blood pressure,  high triglycerides, low HDL cholesterol, and high blood sugar.  Hypertension and Heart Disease Clinical staff conducted group or individual video education with verbal and written material and guidebook.  Patient learns that high blood pressure, or hypertension, is very common in the [place]. Hypertension is largely due to excessive salt intake, but other important risk factors include being overweight, physical inactivity, drinking too much alcohol, smoking, and not eating enough potassium from fruits and vegetables. High blood pressure is a leading risk factor for heart attack, stroke, congestive heart failure, dementia, kidney failure, and premature death. Long-term effects of excessive salt intake include stiffening of the arteries and thickening of heart muscle and organ damage. Recommendations include ways to reduce hypertension and the risk of heart disease.  Diseases of Our Time - Focusing on Diabetes Clinical staff conducted group or individual video education with verbal and written material and guidebook.  Patient learns why the best way to stop diseases of our time is prevention, through food and other lifestyle changes. Medicine (such as prescription pills and surgeries) is often only a Band-Aid on the problem, not a long-term solution. Most common diseases of our time include obesity, type 2 diabetes, hypertension, heart disease, and cancer. The Pritikin Program is recommended and has been proven to help reduce, reverse, and/or prevent the damaging effects of metabolic syndrome.  Nutrition   Overview of the Pritikin Eating Plan  Clinical staff conducted group or individual video education with verbal and written material and guidebook.  Patient  learns about the [place] for disease risk reduction. The [place] emphasizes a wide variety of unrefined, minimally-processed carbohydrates, like fruits, vegetables, whole grains, and legumes. Go, Caution, and Stop food choices are explained. Plant-based and lean animal proteins are emphasized. Rationale provided for low sodium intake for blood pressure control, low added sugars for blood sugar stabilization, and low added fats and oils for coronary artery disease risk reduction and weight management.  Calorie Density  Clinical staff conducted group or individual video education with verbal and written material and guidebook.  Patient learns about calorie density and how it impacts the Pritikin Eating Plan. Knowing the characteristics of the food you choose will help you decide whether those foods will lead to weight gain or weight loss, and whether you want to consume more or less of them. Weight loss is usually a side effect of the Pritikin Eating Plan because of its focus on low calorie-dense foods.  Label Reading  Clinical staff conducted group or individual video education with verbal and written material and guidebook.  Patient learns about the Pritikin recommended label reading guidelines and corresponding recommendations regarding calorie density, added sugars, sodium content, and whole grains.  Dining Out - Part 1  Clinical staff conducted group or individual video education with verbal and written material and guidebook.  Patient learns that restaurant meals can be sabotaging because they can be so high in calories, fat, sodium, and/or sugar. Patient learns recommended strategies on how to positively address this and avoid unhealthy pitfalls.  Facts on Fats  Clinical staff conducted group or individual video education with verbal and written material and guidebook.  Patient learns that lifestyle modifications can be just as effective, if not more so, as many  medications for lowering your risk of heart disease. A Pritikin lifestyle can help to reduce your risk of inflammation and atherosclerosis (cholesterol build-up, or plaque, in the artery walls). Lifestyle interventions such as dietary choices and physical activity address the cause  of atherosclerosis. A review of the types of fats and their impact on blood cholesterol levels, along with dietary recommendations to reduce fat intake is also included.  Nutrition Action Plan  Clinical staff conducted group or individual video education with verbal and written material and guidebook.  Patient learns how to incorporate Pritikin recommendations into their lifestyle. Recommendations include planning and keeping personal health goals in mind as an important part of their success.  Healthy Mind-Set    Healthy Minds, Bodies, Hearts  Clinical staff conducted group or individual video education with verbal and written material and guidebook.  Patient learns how to identify when they are stressed. Video will discuss the impact of that stress, as well as the many benefits of stress management. Patient will also be introduced to stress management techniques. The way we think, act, and feel has an impact on our hearts.  How Our Thoughts Can Heal Our Hearts  Clinical staff conducted group or individual video education with verbal and written material and guidebook.  Patient learns that negative thoughts can cause depression and anxiety. This can result in negative lifestyle behavior and serious health problems. Cognitive behavioral therapy is an effective method to help control our thoughts in order to change and improve our emotional outlook.  Additional Videos:  Exercise    Improving Performance  Clinical staff conducted group or individual video education with verbal and written material and guidebook.  Patient learns to use a non-linear approach by alternating intensity levels and lengths of time spent  exercising to help burn more calories and lose more body fat. Cardiovascular exercise helps improve heart health, metabolism, hormonal balance, blood sugar control, and recovery from fatigue. Resistance training improves strength, endurance, balance, coordination, reaction time, metabolism, and muscle mass. Flexibility exercise improves circulation, posture, and balance. Seek guidance from your physician and exercise physiologist before implementing an exercise routine and learn your capabilities and proper form for all exercise.  Introduction to Yoga  Clinical staff conducted group or individual video education with verbal and written material and guidebook.  Patient learns about yoga, a discipline of the coming together of mind, breath, and body. The benefits of yoga include improved flexibility, improved range of motion, better posture and core strength, increased lung function, weight loss, and positive self-image. Yoga's heart health benefits include lowered blood pressure, healthier heart rate, decreased cholesterol and triglyceride levels, improved immune function, and reduced stress. Seek guidance from your physician and exercise physiologist before implementing an exercise routine and learn your capabilities and proper form for all exercise.  Medical   Aging: Enhancing Your Quality of Life  Clinical staff conducted group or individual video education with verbal and written material and guidebook.  Patient learns key strategies and recommendations to stay in good physical health and enhance quality of life, such as prevention strategies, having an advocate, securing a [place], and keeping a list of medications and system for tracking them. It also discusses how to avoid risk for bone loss.  Biology of Weight Control  Clinical staff conducted group or individual video education with verbal and written material and guidebook.  Patient learns that weight gain occurs  because we consume more calories than we burn (eating more, moving less). Even if your body weight is normal, you may have higher ratios of fat compared to muscle mass. Too much body fat puts you at increased risk for cardiovascular disease, heart attack, stroke, type 2 diabetes, and obesity-related cancers. In addition to  exercise, following the [place] can help reduce your risk.  Decoding Lab Results  Clinical staff conducted group or individual video education with verbal and written material and guidebook.  Patient learns that lab test reflects one measurement whose values change over time and are influenced by many factors, including medication, stress, sleep, exercise, food, hydration, pre-existing medical conditions, and more. It is recommended to use the knowledge from this video to become more involved with your lab results and evaluate your numbers to speak with your doctor.   Diseases of Our Time - Overview  Clinical staff conducted group or individual video education with verbal and written material and guidebook.  Patient learns that according to the CDC, 50% to 70% of chronic diseases (such as obesity, type 2 diabetes, elevated lipids, hypertension, and heart disease) are avoidable through lifestyle improvements including healthier food choices, listening to satiety cues, and increased physical activity.  Sleep Disorders Clinical staff conducted group or individual video education with verbal and written material and guidebook.  Patient learns how good quality and duration of sleep are important to overall health and well-being. Patient also learns about sleep disorders and how they impact health along with recommendations to address them, including discussing with a physician.  Nutrition  Dining Out - Part 2 Clinical staff conducted group or individual video education with verbal and written material and guidebook.  Patient learns how to plan ahead and communicate in  order to maximize their dining experience in a healthy and nutritious manner. Included are recommended food choices based on the type of restaurant the patient is visiting.   Fueling a Best boy conducted group or individual video education with verbal and written material and guidebook.  There is a strong connection between our food choices and our health. Diseases like obesity and type 2 diabetes are very prevalent and are in large-part due to lifestyle choices. The Pritikin Eating Plan provides plenty of food and hunger-curbing satisfaction. It is easy to follow, affordable, and helps reduce health risks.  Menu Workshop  Clinical staff conducted group or individual video education with verbal and written material and guidebook.  Patient learns that restaurant meals can sabotage health goals because they are often packed with calories, fat, sodium, and sugar. Recommendations include strategies to plan ahead and to communicate with the manager, chef, or server to help order a healthier meal.  Planning Your Eating Strategy  Clinical staff conducted group or individual video education with verbal and written material and guidebook.  Patient learns about the [place] and its benefit of reducing the risk of disease. The [place] does not focus on calories. Instead, it emphasizes high-quality, nutrient-rich foods. By knowing the characteristics of the foods, we choose, we can determine their calorie density and make informed decisions.  Targeting Your Nutrition Priorities  Clinical staff conducted group or individual video education with verbal and written material and guidebook.  Patient learns that lifestyle habits have a tremendous impact on disease risk and progression. This video provides eating and physical activity recommendations based on your personal health goals, such as reducing LDL cholesterol, losing weight, preventing or controlling type 2  diabetes, and reducing high blood pressure.  Vitamins and Minerals  Clinical staff conducted group or individual video education with verbal and written material and guidebook.  Patient learns different ways to obtain key vitamins and minerals, including through a recommended healthy diet. It is important to discuss all supplements you take with your  doctor.   Healthy Mind-Set    Smoking Cessation  Clinical staff conducted group or individual video education with verbal and written material and guidebook.  Patient learns that cigarette smoking and tobacco addiction pose a serious health risk which affects millions of people. Stopping smoking will significantly reduce the risk of heart disease, lung disease, and many forms of cancer. Recommended strategies for quitting are covered, including working with your doctor to develop a successful plan.  Culinary   Becoming a Financial trader conducted group or individual video education with verbal and written material and guidebook.  Patient learns that cooking at home can be healthy, cost-effective, quick, and puts them in control. Keys to cooking healthy recipes will include looking at your recipe, assessing your equipment needs, planning ahead, making it simple, choosing cost-effective seasonal ingredients, and limiting the use of added fats, salts, and sugars.  Cooking - Breakfast and Snacks  Clinical staff conducted group or individual video education with verbal and written material and guidebook.  Patient learns how important breakfast is to satiety and nutrition through the entire day. Recommendations include key foods to eat during breakfast to help stabilize blood sugar levels and to prevent overeating at meals later in the day. Planning ahead is also a key component.  Cooking - Human resources officer conducted group or individual video education with verbal and written material and guidebook.  Patient learns eating  strategies to improve overall health, including an approach to cook more at home. Recommendations include thinking of animal protein as a side on your plate rather than center stage and focusing instead on lower calorie dense options like vegetables, fruits, whole grains, and plant-based proteins, such as beans. Making sauces in large quantities to freeze for later and leaving the skin on your vegetables are also recommended to maximize your experience.  Cooking - Healthy Salads and Dressing Clinical staff conducted group or individual video education with verbal and written material and guidebook.  Patient learns that vegetables, fruits, whole grains, and legumes are the foundations of the [place]. Recommendations include how to incorporate each of these in flavorful and healthy salads, and how to create homemade salad dressings. Proper handling of ingredients is also covered. Cooking - Soups and [employer] - Soups and Desserts Clinical staff conducted group or individual video education with verbal and written material and guidebook.  Patient learns that Pritikin soups and desserts make for easy, nutritious, and delicious snacks and meal components that are low in sodium, fat, sugar, and calorie density, while high in vitamins, minerals, and filling fiber. Recommendations include simple and healthy ideas for soups and desserts.   Overview     The Pritikin Solution Program Overview Clinical staff conducted group or individual video education with verbal and written material and guidebook.  Patient learns that the results of the [place] Program have been documented in more than 100 articles published in peer-reviewed journals, and the benefits include reducing risk factors for (and, in some cases, even reversing) high cholesterol, high blood pressure, type 2 diabetes, obesity, and more! An overview of the three key pillars of the Pritikin Program will be covered: eating well, doing  regular exercise, and having a healthy mind-set.  WORKSHOPS  Exercise: Exercise Basics: Building Your Action Plan Clinical staff led group instruction and group discussion with PowerPoint presentation and patient guidebook. To enhance the learning environment the use of posters, models and videos may be added. At the conclusion of  this workshop, patients will comprehend the difference between physical activity and exercise, as well as the benefits of incorporating both, into their routine. Patients will understand the FITT (Frequency, Intensity, Time, and Type) principle and how to use it to build an exercise action plan. In addition, safety concerns and other considerations for exercise and cardiac rehab will be addressed by the presenter. The purpose of this lesson is to promote a comprehensive and effective weekly exercise routine in order to improve patients' overall level of fitness.   Managing Heart Disease: Your Path to a Healthier Heart Clinical staff led group instruction and group discussion with PowerPoint presentation and patient guidebook. To enhance the learning environment the use of posters, models and videos may be added.At the conclusion of this workshop, patients will understand the anatomy and physiology of the heart. Additionally, they will understand how Pritikin's three pillars impact the risk factors, the progression, and the management of heart disease.  The purpose of this lesson is to provide a high-level overview of the heart, heart disease, and how the Pritikin lifestyle positively impacts risk factors.  Exercise Biomechanics Clinical staff led group instruction and group discussion with PowerPoint presentation and patient guidebook. To enhance the learning environment the use of posters, models and videos may be added. Patients will learn how the structural parts of their bodies function and how these functions impact their daily activities, movement, and exercise.  Patients will learn how to promote a neutral spine, learn how to manage pain, and identify ways to improve their physical movement in order to promote healthy living. The purpose of this lesson is to expose patients to common physical limitations that impact physical activity. Participants will learn practical ways to adapt and manage aches and pains, and to minimize their effect on regular exercise. Patients will learn how to maintain good posture while sitting, walking, and lifting.  Balance Training and Fall Prevention  Clinical staff led group instruction and group discussion with PowerPoint presentation and patient guidebook. To enhance the learning environment the use of posters, models and videos may be added. At the conclusion of this workshop, patients will understand the importance of their sensorimotor skills (vision, proprioception, and the vestibular system) in maintaining their ability to balance as they age. Patients will apply a variety of balancing exercises that are appropriate for their current level of function. Patients will understand the common causes for poor balance, possible solutions to these problems, and ways to modify their physical environment in order to minimize their fall risk. The purpose of this lesson is to teach patients about the importance of maintaining balance as they age and ways to minimize their risk of falling.  WORKSHOPS   Nutrition:  Fueling a Scientist, research (physical sciences) led group instruction and group discussion with PowerPoint presentation and patient guidebook. To enhance the learning environment the use of posters, models and videos may be added. Patients will review the foundational principles of the [place] and understand what constitutes a serving size in each of the food groups. Patients will also learn Pritikin-friendly foods that are better choices when away from home and review make-ahead meal and snack options. Calorie  density will be reviewed and applied to three nutrition priorities: weight maintenance, weight loss, and weight gain. The purpose of this lesson is to reinforce (in a group setting) the key concepts around what patients are recommended to eat and how to apply these guidelines when away from home by planning and selecting Pritikin-friendly options. Patients  will understand how calorie density may be adjusted for different weight management goals.  Mindful Eating  Clinical staff led group instruction and group discussion with PowerPoint presentation and patient guidebook. To enhance the learning environment the use of posters, models and videos may be added. Patients will briefly review the concepts of the [place] and the importance of low-calorie dense foods. The concept of mindful eating will be introduced as well as the importance of paying attention to internal hunger signals. Triggers for non-hunger eating and techniques for dealing with triggers will be explored. The purpose of this lesson is to provide patients with the opportunity to review the basic principles of the [place], discuss the value of eating mindfully and how to measure internal cues of hunger and fullness using the Hunger Scale. Patients will also discuss reasons for non-hunger eating and learn strategies to use for controlling emotional eating.  Targeting Your Nutrition Priorities Clinical staff led group instruction and group discussion with PowerPoint presentation and patient guidebook. To enhance the learning environment the use of posters, models and videos may be added. Patients will learn how to determine their genetic susceptibility to disease by reviewing their family history. Patients will gain insight into the importance of diet as part of an overall healthy lifestyle in mitigating the impact of genetics and other environmental insults. The purpose of this lesson is to provide patients with the  opportunity to assess their personal nutrition priorities by looking at their family history, their own health history and current risk factors. Patients will also be able to discuss ways of prioritizing and modifying the [place] for their highest risk areas  Menu  Clinical staff led group instruction and group discussion with PowerPoint presentation and patient guidebook. To enhance the learning environment the use of posters, models and videos may be added. Using menus brought in from [employer], or printed from [employer], patients will apply the [place] dining out guidelines that were presented in the [employer] video. Patients will also be able to practice these guidelines in a variety of provided scenarios. The purpose of this lesson is to provide patients with the opportunity to practice hands-on learning of the [place] with actual menus and practice scenarios.  Label Reading Clinical staff led group instruction and group discussion with PowerPoint presentation and patient guidebook. To enhance the learning environment the use of posters, models and videos may be added. Patients will review and discuss the Pritikin label reading guidelines presented in Pritikin's Label Reading Educational series video. Using fool labels brought in from local grocery stores and markets, patients will apply the label reading guidelines and determine if the packaged food meet the Pritikin guidelines. The purpose of this lesson is to provide patients with the opportunity to review, discuss, and practice hands-on learning of the Pritikin Label Reading guidelines with actual packaged food labels. [place] Workshops are designed to teach patients ways to prepare quick, simple, and affordable recipes at home. The importance of nutrition's role in chronic disease risk reduction is reflected in its emphasis in the overall  Pritikin program. By learning how to prepare essential core Pritikin Eating Plan recipes, patients will increase control over what they eat; be able to customize the flavor of foods without the use of added salt, sugar, or fat; and improve the quality of the food they consume. By learning a set of core recipes which are easily assembled, quickly prepared,  and affordable, patients are more likely to prepare more healthy foods at home. These workshops focus on convenient breakfasts, simple entres, side dishes, and desserts which can be prepared with minimal effort and are consistent with nutrition recommendations for cardiovascular risk reduction. Cooking [employer] are taught by a Engineer, materials (RD) who has been trained by the [employer]. The chef or RD has a clear understanding of the importance of minimizing - if not completely eliminating - added fat, sugar, and sodium in recipes. Throughout the series of [place] Workshop sessions, patients will learn about healthy ingredients and efficient methods of cooking to build confidence in their capability to prepare    Cooking School weekly topics:  Adding Flavor- Sodium-Free  Fast and Healthy Breakfasts  Powerhouse Plant-Based Proteins  Satisfying Salads and Dressings  Simple Sides and Sauces  International Cuisine-Spotlight on the [place] Zones  Delicious Desserts  Savory Soups  Efficiency Cooking - Meals in a Snap  Tasty Appetizers and Snacks  Comforting Weekend Breakfasts  One-Pot Wonders   Fast Evening Meals  Easy [place] (Psychosocial): New Thoughts, New Behaviors Clinical staff led group instruction and group discussion with PowerPoint presentation and patient guidebook. To enhance the learning environment the use of posters, models and videos may be added. Patients will learn and practice techniques for developing effective health  and lifestyle goals. Patients will be able to effectively apply the goal setting process learned to develop at least one new personal goal.  The purpose of this lesson is to expose patients to a new skill set of behavior modification techniques such as techniques setting SMART goals, overcoming barriers, and achieving new thoughts and new behaviors.  Managing Moods and Relationships Clinical staff led group instruction and group discussion with PowerPoint presentation and patient guidebook. To enhance the learning environment the use of posters, models and videos may be added. Patients will learn how emotional and chronic stress factors can impact their health and relationships. They will learn healthy ways to manage their moods and utilize positive coping mechanisms. In addition, ICR patients will learn ways to improve communication skills. The purpose of this lesson is to expose patients to ways of understanding how one's mood and health are intimately connected. Developing a healthy outlook can help build positive relationships and connections with others. Patients will understand the importance of utilizing effective communication skills that include actively listening and being heard. They will learn and understand the importance of the "4 Cs" and especially Connections in fostering of a Healthy Mind-Set.  Healthy Sleep for a Healthy Heart Clinical staff led group instruction and group discussion with PowerPoint presentation and patient guidebook. To enhance the learning environment the use of posters, models and videos may be added. At the conclusion of this workshop, patients will be able to demonstrate knowledge of the importance of sleep to overall health, well-being, and quality of life. They will understand the symptoms of, and treatments for, common sleep disorders. Patients will also be able to identify daytime and nighttime behaviors which impact sleep, and they will be able to apply these tools  to help manage sleep-related challenges. The purpose of this lesson is to provide patients with a general overview of sleep and outline the importance of quality sleep. Patients will learn about a few of the most common sleep disorders. Patients will also be introduced to the concept of "sleep hygiene," and discover ways to self-manage certain  sleeping problems through simple daily behavior changes. Finally, the workshop will motivate patients by clarifying the links between quality sleep and their goals of heart-healthy living.   Recognizing and Reducing Stress Clinical staff led group instruction and group discussion with PowerPoint presentation and patient guidebook. To enhance the learning environment the use of posters, models and videos may be added. At the conclusion of this workshop, patients will be able to understand the types of stress reactions, differentiate between acute and chronic stress, and recognize the impact that chronic stress has on their health. They will also be able to apply different coping mechanisms, such as reframing negative self-talk. Patients will have the opportunity to practice a variety of stress management techniques, such as deep abdominal breathing, progressive muscle relaxation, and/or guided imagery.  The purpose of this lesson is to educate patients on the role of stress in their lives and to provide healthy techniques for coping with it.  Learning Barriers/Preferences:  Learning Barriers/Preferences - 04/23/22 1629       Learning Barriers/Preferences   Learning Barriers Hearing;Exercise Concerns   HOH and  balance concerns   Learning Preferences Written Material;Video;Skilled Demonstration;Pictoral             Education Topics:  Knowledge Questionnaire Score:  Knowledge Questionnaire Score - 04/23/22 1418       Knowledge Questionnaire Score   Pre Score 18/28             Core Components/Risk Factors/Patient Goals at Admission:  Personal  Goals and Risk Factors at Admission - 04/23/22 1631       Core Components/Risk Factors/Patient Goals on Admission   Diabetes Yes    Intervention Provide education about signs/symptoms and action to take for hypo/hyperglycemia.;Provide education about proper nutrition, including hydration, and aerobic/resistive exercise prescription along with prescribed medications to achieve blood glucose in normal ranges: Fasting glucose 65-99 mg/dL    Expected Outcomes Short Term: Participant verbalizes understanding of the signs/symptoms and immediate care of hyper/hypoglycemia, proper foot care and importance of medication, aerobic/resistive exercise and nutrition plan for blood glucose control.;Long Term: Attainment of HbA1C < 7%.    Hypertension Yes    Intervention Provide education on lifestyle modifcations including regular physical activity/exercise, weight management, moderate sodium restriction and increased consumption of fresh fruit, vegetables, and low fat dairy, alcohol moderation, and smoking cessation.;Monitor prescription use compliance.    Expected Outcomes Short Term: Continued assessment and intervention until BP is < 140/58m HG in hypertensive participants. < 130/874mHG in hypertensive participants with diabetes, heart failure or chronic kidney disease.;Long Term: Maintenance of blood pressure at goal levels.    Lipids Yes    Intervention Provide education and support for participant on nutrition & aerobic/resistive exercise along with prescribed medications to achieve LDL '70mg'$ , HDL >'40mg'$ .    Expected Outcomes Short Term: Participant states understanding of desired cholesterol values and is compliant with medications prescribed. Participant is following exercise prescription and nutrition guidelines.;Long Term: Cholesterol controlled with medications as prescribed, with individualized exercise RX and with personalized nutrition plan. Value goals: LDL < '70mg'$ , HDL > 40 mg.    Personal Goal Other  Yes    Personal Goal Short and long the same: support and encourage wife, edurance, control CP    Intervention Will continue to monitor pt and progress workloads as tolerated without sign or symptom    Expected Outcomes Pt will achieve his goals             Core Components/Risk Factors/Patient Goals Review:  Core Components/Risk Factors/Patient Goals at Discharge (Final Review):    ITP Comments:  ITP Comments     Row Name 04/23/22 1716           ITP Comments Dr Fransico Him MD, Medical Director Introduction to Pritikin Education Program/ Intensive Cardiac Rehab. Inital Pritikin Orientation Packet Reviewed with the patient and his wife.                Comments: Participant attended orientation for the cardiac rehabilitation program on  04/23/2022  to perform initial intake and exercise walk test. Patient introduced to the [place] education and orientation packet was reviewed. Completed 6-minute walk test, measurements, initial ITP, and exercise prescription. Vital signs stable. Telemetry-normal sinus rhythm, with arrhythmia first degree heart block this has been previously documented.  asymptomatic.Harrell Gave RN BSN  Mr Quattrone is going to hold off on starting exercise on Monday as his wife's cardiac rehab is on hold.Harrell Gave RN BSN   Service time was from [phone] to [phone].

## 2022-04-29 ENCOUNTER — Ambulatory Visit (HOSPITAL_COMMUNITY): Payer: Medicare Other

## 2022-05-01 ENCOUNTER — Ambulatory Visit (HOSPITAL_COMMUNITY): Payer: Medicare Other

## 2022-05-04 ENCOUNTER — Ambulatory Visit (HOSPITAL_COMMUNITY): Payer: Medicare Other

## 2022-05-05 DIAGNOSIS — K1329 Other disturbances of oral epithelium, including tongue: Secondary | ICD-10-CM | POA: Diagnosis not present

## 2022-05-05 DIAGNOSIS — B079 Viral wart, unspecified: Secondary | ICD-10-CM | POA: Diagnosis not present

## 2022-05-06 ENCOUNTER — Ambulatory Visit (HOSPITAL_COMMUNITY): Payer: Medicare Other

## 2022-05-08 ENCOUNTER — Ambulatory Visit (HOSPITAL_COMMUNITY): Payer: Medicare Other

## 2022-05-11 ENCOUNTER — Ambulatory Visit (HOSPITAL_COMMUNITY): Payer: Medicare Other

## 2022-05-13 ENCOUNTER — Ambulatory Visit (HOSPITAL_COMMUNITY): Payer: Medicare Other

## 2022-05-15 ENCOUNTER — Ambulatory Visit (HOSPITAL_COMMUNITY): Payer: Medicare Other

## 2022-05-18 ENCOUNTER — Ambulatory Visit (HOSPITAL_COMMUNITY): Payer: Medicare Other

## 2022-05-20 ENCOUNTER — Ambulatory Visit (HOSPITAL_COMMUNITY): Payer: Medicare Other

## 2022-05-22 ENCOUNTER — Ambulatory Visit (HOSPITAL_COMMUNITY): Payer: Medicare Other

## 2022-05-25 ENCOUNTER — Ambulatory Visit (HOSPITAL_COMMUNITY): Payer: Medicare Other

## 2022-05-27 ENCOUNTER — Ambulatory Visit (HOSPITAL_COMMUNITY): Payer: Medicare Other

## 2022-05-29 ENCOUNTER — Ambulatory Visit (HOSPITAL_COMMUNITY): Payer: Medicare Other

## 2022-06-03 ENCOUNTER — Ambulatory Visit (HOSPITAL_COMMUNITY): Payer: Medicare Other

## 2022-06-05 ENCOUNTER — Ambulatory Visit (HOSPITAL_COMMUNITY): Payer: Medicare Other

## 2022-06-08 ENCOUNTER — Ambulatory Visit (HOSPITAL_COMMUNITY): Payer: Medicare Other

## 2022-06-10 ENCOUNTER — Ambulatory Visit (HOSPITAL_COMMUNITY): Payer: Medicare Other

## 2022-06-12 ENCOUNTER — Ambulatory Visit (HOSPITAL_COMMUNITY): Payer: Medicare Other

## 2022-06-15 ENCOUNTER — Ambulatory Visit (HOSPITAL_COMMUNITY): Payer: Medicare Other

## 2022-06-17 ENCOUNTER — Ambulatory Visit (HOSPITAL_COMMUNITY): Payer: Medicare Other

## 2022-06-19 ENCOUNTER — Ambulatory Visit (HOSPITAL_COMMUNITY): Payer: Medicare Other

## 2022-06-22 ENCOUNTER — Ambulatory Visit (HOSPITAL_COMMUNITY): Payer: Medicare Other

## 2022-06-24 ENCOUNTER — Ambulatory Visit (HOSPITAL_COMMUNITY): Payer: Medicare Other

## 2022-06-25 ENCOUNTER — Other Ambulatory Visit: Payer: Self-pay | Admitting: Cardiology

## 2022-06-26 ENCOUNTER — Ambulatory Visit (HOSPITAL_COMMUNITY): Payer: Medicare Other

## 2022-07-01 ENCOUNTER — Other Ambulatory Visit: Payer: Self-pay | Admitting: Student

## 2022-08-11 DIAGNOSIS — H6123 Impacted cerumen, bilateral: Secondary | ICD-10-CM | POA: Diagnosis not present

## 2022-09-01 NOTE — Progress Notes (Unsigned)
**Note [name]-Identified via Obfuscation** Benjamin [name] Nurse Date of Birth: [birth year] Medical Record #[id number]  History of Present Illness: Benjamin [name] is seen  for follow up s/p cardiac cath. He has a history of coronary disease with prior angioplasty of the proximal LAD in 1991. He also had angioplasty of the circumflex at that time. He has known occlusion of the right coronary with collateral flow. Last Myoview study in October 2015 showed a small anterolateral scar. No ischemia.    On a prior  visit he described 3 episodes of chest pain for which he took sl Ntg. Lasted 5-10 minutes. No clear triggers. One time he became diaphoretic.  He denies dyspnea or palpitation.  He had a Myoview study showing anterior ischemia. This led to a cardiac cath in February 2020 showing occluded RCA and moderate nonobstructive disease in the LAD and LCx. Unchanged from prior study. Medical therapy recommended.   He was on maximal antianginal therapy with  aspirin, atenolol, and isosorbide, Ranexa. He presented in March with  unstable angina. Cardiac cath showed progressive multivessel CAD. He subsequently underwent CABG by Dr Benjamin [name] with LIMA to the LAD, SVG to diagonal and SVG to OM. It was noted that the RCA branches were too small to graft. His post op course was fairly uneventful. He had Afib on post op day #3 but converted on amiodarone. He had post op anemia. When I saw him last we discontinued amiodarone due to Nausea and poor appetite. When seen by Dr Benjamin [name] on May 10 he was in Atrial flutter with rate 150. He was asymptomatic. Was seen in the Afib clinic and was back in NSR. Atenolol dose was increased. Event monitor was placed showing a single episode of SVT lasting 36 seconds.   He was seen on September 22 by Dr Benjamin [name] for evaluation of exertional chest pain. He was started on Imdur and is seen back to evaluate response. He states that this helped about a week but then his pain returned. This is described as burning mid sternal pain that  radiates to his left jaw and arm. It is his typical angina. It is relieved with rest and sl Ntg. It is associated with exertion- just walking to his mailbox.   He underwent cardiac cath on 07/17/21. This showed patent LIMA to the LAD. The SVG to the first diagonal was occluded but the diagonal had good antegrade flow. The SVG to OM showed aorto-ostial stenosis as well as stenosis at the distal anastomosis. The ostial lesion was successfully stented and the distal lesion was treated with a cutting balloon with good result. Since his intervention he has noted improvement in his symptoms.   He does note symptoms of chest burning with exertion at times. Typically relieved with sl Ntg x 1. No dyspnea. Smoking a little.     Allergies as of 09/03/2022       Reactions   Sulfa Drugs Cross Reactors Rash        Medication List        Accurate as of September 01, 2022  1:10 PM. If you have any questions, ask your nurse or doctor.          acetaminophen 500 MG tablet Commonly known as: TYLENOL Take 1,000 mg by mouth every 6 (six) hours as needed for moderate pain or headache.   ANTI-FUNGAL EX Apply 1 application topically daily at 12 noon. Jock itch   aspirin EC 81 MG tablet Take 1 tablet (81 mg total) by mouth daily.  atenolol 25 MG tablet Commonly known as: TENORMIN TAKE 1 TABLET BY MOUTH EVERY MORNING AND ONE-HALF TABLET IN THE EVENING   atorvastatin 80 MG tablet Commonly known as: LIPITOR TAKE 1 TABLET BY MOUTH DAILY   clopidogrel 75 MG tablet Commonly known as: PLAVIX TAKE 1 TABLET BY MOUTH EVERY DAY WITH BREAKFAST   docusate sodium 50 MG capsule Commonly known as: COLACE Take 50 mg by mouth at bedtime.   Farxiga 10 MG Tabs tablet Generic drug: dapagliflozin propanediol Take 10 mg by mouth in the morning.   Farxiga 5 MG Tabs tablet Generic drug: dapagliflozin propanediol TAKE 1 TABLET(5 MG) BY MOUTH DAILY BEFORE BREAKFAST   glimepiride 2 MG tablet Commonly known as:  AMARYL TAKE 1 TABLET(2 MG) BY MOUTH DAILY WITH BREAKFAST   isosorbide mononitrate 120 MG 24 hr tablet Commonly known as: IMDUR Take 1 tablet (120 mg total) by mouth daily.   Januvia 100 MG tablet Generic drug: sitaGLIPtin Take 100 mg by mouth in the morning.   metFORMIN 1000 MG tablet Commonly known as: GLUCOPHAGE Take 1 tablet (1,000 mg total) by mouth 2 (two) times daily.   nitroGLYCERIN 0.4 MG SL tablet Commonly known as: Nitrostat Place 1 tablet (0.4 mg total) under the tongue every 5 (five) minutes as needed for chest pain.   OneTouch Delica Plus [id number] Misc Apply 1 each topically daily.   ranolazine 1000 MG SR tablet Commonly known as: Ranexa Take 1 tablet (1,000 mg total) by mouth 2 (two) times daily.         Allergies  Allergen Reactions   Sulfa Drugs Cross Reactors Rash    Past Medical History:  Diagnosis Date   Asthma    as a child, no problems as an adult, no inhaler   Coronary artery disease    Prior PCI to the prox LAD in 1991; S/P PCI to LCX in October 1991   Diabetes mellitus    TYPE 2   GERD (gastroesophageal reflux disease)    occasional - diet controlled   History of kidney stones    many yrs ago   Hyperlipidemia    Tobacco abuse    quit 09/2020 - smoked for 64 yrs    Past Surgical History:  Procedure Laterality Date   COLONOSCOPY WITH PROPOFOL N/A 04/21/2016   Procedure: COLONOSCOPY WITH PROPOFOL;  Surgeon: Garlan Fair, MD;  Location: [facility];  Service: Endoscopy;  Laterality: N/A;   CORONARY ANGIOPLASTY  05/1990   PROXIMAL LAD   CORONARY ANGIOPLASTY  06/1990   LEFT CIRCUMFLEX CORONARY   CORONARY ARTERY BYPASS GRAFT N/A 12/26/2020   Procedure: CORONARY ARTERY BYPASS GRAFTING (CABG), ON PUMP, TIMES THREE, USING LEFT INTERNAL MAMMARY ARTERY AND ENDOSCOPICALLY HARVESTED RIGHT GREATER SAPHENOUS VEIN;  Surgeon: Melrose Nakayama, MD;  Location: [facility];  Service: Open Heart Surgery;  Laterality: N/A;   CORONARY STENT INTERVENTION  N/A 07/17/2021   Procedure: CORONARY STENT INTERVENTION;  Surgeon: Benjamin [name] M, MD;  Location: [facility];  Service: Cardiovascular;  Laterality: N/A;   KIDNEY STONE SURGERY Bilateral    x 2 surgeries   LEFT HEART CATH AND CORONARY ANGIOGRAPHY N/A 11/18/2018   Procedure: LEFT HEART CATH AND CORONARY ANGIOGRAPHY;  Surgeon: Martinique, Margarethe Virgen M, MD;  Location: [facility];  Service: Cardiovascular;  Laterality: N/A;   LEFT HEART CATH AND CORONARY ANGIOGRAPHY N/A 12/17/2020   Procedure: LEFT HEART CATH AND CORONARY ANGIOGRAPHY;  Surgeon: Martinique, Theadora Noyes M, MD;  Location: [facility];  Service: Cardiovascular;  Laterality: N/A;   LEFT HEART CATH AND CORS/GRAFTS ANGIOGRAPHY N/A 07/17/2021   Procedure: LEFT HEART CATH AND CORS/GRAFTS ANGIOGRAPHY;  Surgeon: Martinique, Aries Kasa M, MD;  Location: [facility];  Service: Cardiovascular;  Laterality: N/A;   TEE WITHOUT CARDIOVERSION N/A 12/26/2020   Procedure: TRANSESOPHAGEAL ECHOCARDIOGRAM (TEE);  Surgeon: Melrose Nakayama, MD;  Location: [facility];  Service: Open Heart Surgery;  Laterality: N/A;   UMBILICAL HERNIA REPAIR      Social History   Tobacco Use  Smoking Status Former   Packs/day: 1.00   Years: 64.00   Total pack years: 64.00   Types: Cigarettes   Quit date: 09/28/2020   Years since quitting: 1.9  Smokeless Tobacco Never    Social History   Substance and Sexual Activity  Alcohol Use Yes   Alcohol/week: 3.0 - 4.0 standard drinks of alcohol   Types: 3 - 4 Standard drinks or equivalent per week   Comment: every other weekend - liquor    Family History  Problem Relation Age of Onset   Heart disease Sister        had open heart surgery    Review of Systems: As noted in history of present illness.  All other systems were reviewed and are negative.  Physical Exam: There were no vitals taken for this visit. GENERAL:  Well appearing WM in NAD HEENT:  PERRL, EOMI, sclera are clear. Oropharynx is clear. NECK:  No  jugular venous distention, carotid upstroke brisk and symmetric, no bruits, no thyromegaly or adenopathy LUNGS:  Clear to auscultation bilaterally CHEST:  Incisions healing well.  HEART:  RRR,  PMI not displaced or sustained,S1 and S2 within normal limits, no S3, no S4: no clicks, no rubs, no murmurs ABD:  Soft, nontender. BS +, no masses or bruits. No hepatomegaly, no splenomegaly EXT:  2 + pulses throughout, no edema, no cyanosis no clubbing.  SKIN:  Warm and dry.  No rashes NEURO:  Alert and oriented x 3. Cranial nerves II through XII intact. PSYCH:  Cognitively intact   LABORATORY DATA:  Lab Results  Component Value Date   WBC 6.7 07/14/2021   HGB 11.9 (L) 07/14/2021   HCT 38.3 07/14/2021   PLT 192 07/14/2021   GLUCOSE 151 (H) 07/14/2021   CHOL 103 07/14/2021   TRIG 53 07/14/2021   HDL 50 07/14/2021   LDLCALC 40 07/14/2021   ALT 22 06/19/2021   AST 20 06/19/2021   NA 137 07/14/2021   K 4.7 07/14/2021   CL 102 07/14/2021   CREATININE 0.74 (L) 07/14/2021   BUN 12 07/14/2021   CO2 24 07/14/2021   TSH 2.030 06/19/2021   INR 1.6 (H) 12/26/2020   HGBA1C 8.5 (H) 12/25/2020   Labs dated 08/05/16: cholesterol 115, triglycerides 56, LDL 49, HDL 56. A1c 7.4%. CMET normal. Dated 08/30/17: cholesterol 120, triglycerides 58, HDL 46, LDL 62. A1c 7.8%. Chemistries and CBC normal.  Dated 09/16/18: cholesterol 106, triglycerides 51, HDL 48, LDL48. A1c 7.4%. CMET normal Dated 03/27/19: A1c 7.5%. BMET normal Dated 10/19/19: cholesterol 119, triglycerides 52, HDL 51, LDL 56, CMET normal Dated 04/10/20: A1c 6.8%.  Dated 10/10/20: cholesterol 123, triglycerides 56, HDL 55, LDL 56. A1c 7.1%. CMET normal.  Dated 04/18/21: A1c 8.6% Dated 04/22/22: A1c 7.5%, CMET normal.    Myoview 11/09/18: Study Highlights     The left ventricular ejection fraction is normal (55-65%). Nuclear stress EF: 59%. Blood pressure demonstrated a normal response to exercise. There was no ST  segment deviation noted  during stress. No T wave inversion was noted during stress. Defect 1: There is a small defect of moderate severity present in the mid anterolateral, apical lateral and apex location. Findings consistent with ischemia. This is an intermediate risk study.   The anterolateral perfusion defect on the prior exam was interpreted as fixed, however on today's exam appears to have reversible ischemia. Wall motion appears abnormal in this region.     Cardiac cath 11/18/18:  LEFT HEART CATH AND CORONARY ANGIOGRAPHY  Conclusion     Prox LAD lesion is 45% stenosed. Prox LAD to Mid LAD lesion is 65% stenosed. Ost Cx to Prox Cx lesion is 50% stenosed. Prox Cx to Mid Cx lesion is 50% stenosed. Prox RCA to Dist RCA lesion is 100% stenosed. The left ventricular systolic function is normal. LV end diastolic pressure is normal. The left ventricular ejection fraction is 55-65% by visual estimate.   1. Single vessel occlusive CAD with CTO of the RCA. Left to right collaterals. There is diffuse moderate calcific disease involving the LAD and LCx that is not significantly changed since 2011. 2. Normal LV function 3. Normal LV EDP   Plan: continued medical therapy   Cardiac cath 12/17/20:  LEFT HEART CATH AND CORONARY ANGIOGRAPHY    Conclusion    Prox LAD lesion is 45% stenosed. Prox LAD to Mid LAD lesion is 75% stenosed. Ost Cx to Prox Cx lesion is 90% stenosed. Prox Cx to Mid Cx lesion is 60% stenosed. Prox RCA to Dist RCA lesion is 100% stenosed. Ost LM to Mid LM lesion is 30% stenosed. Ost LAD lesion is 80% stenosed. The left ventricular systolic function is normal. LV end diastolic pressure is normal. The left ventricular ejection fraction is 55-65% by visual estimate.   1. Severe 3 vessel obstructive CAD. Patient has CTO of the RCA. Progressive ostial LAD and LCX disease. Severely calcified vessels 2. Normal LV function 3. Normal LVEDP   Plan: recommend referral to CT surgery for  consideration of CABG.    Coronary Diagrams   Diagnostic Dominance: Right    Intervention   Intraop TEE 04/[birth year]: Complications: No known complications during this procedure.  POST-OP IMPRESSIONS  - Left Ventricle: The left ventricle is unchanged from pre-bypass.  - Right Ventricle: The right ventricle appears unchanged from pre-bypass.  - Aorta: The aorta appears unchanged from pre-bypass.  - Left Atrium: The left atrium appears unchanged from pre-bypass.  - Left Atrial Appendage: The left atrial appendage appears unchanged from  pre-bypass.  - Aortic Valve: The aortic valve appears unchanged from pre-bypass.  - Mitral Valve: The mitral valve appears unchanged from pre-bypass.  - Tricuspid Valve: The tricuspid valve appears unchanged from pre-bypass.  - Interatrial Septum: The interatrial septum appears unchanged from  pre-bypass.  - Interventricular Septum: The interventricular septum appears unchanged  from  pre-bypass.  - Pericardium: The pericardium appears unchanged from pre-bypass.   PRE-OP FINDINGS   Left Ventricle: The left ventricle has normal systolic function, with an  ejection fraction of 55-60%. The cavity size was normal. There is no left  ventricular hypertrophy.    Right Ventricle: The right ventricle has normal systolic function. The  cavity was normal. There is no increase in right ventricular wall  thickness.   Left Atrium: Left atrial size was normal in size. No left atrial/left  atrial appendage thrombus was detected.   Right Atrium: Right atrial size was normal in size.   Interatrial Septum: No atrial level  shunt detected by color flow Doppler.   Pericardium: There is no evidence of pericardial effusion.   Mitral Valve: The mitral valve is normal in structure. Mitral valve  regurgitation is not visualized by color flow Doppler. There is No  evidence of mitral stenosis.   Tricuspid Valve: The tricuspid valve was normal in structure. Tricuspid   valve regurgitation was not visualized by color flow Doppler.   Aortic Valve: The aortic valve is tricuspid Aortic valve regurgitation was  not visualized by color flow Doppler. There is no stenosis of the aortic  valve.    Pulmonic Valve: The pulmonic valve was not assessed.  Pulmonic valve regurgitation was not assessed by color flow Doppler.       Renold Don MD   Event monitor 03/04/21: Study Highlights    Patch Wear Time:  14 days and 0 hours (2022-05-18T15:15:55-0400 to 2022-06-01T15:15:59-0400)   Patient had a min HR of 49 bpm, max HR of 167 bpm, and avg HR of 67 bpm. Predominant underlying rhythm was Sinus Rhythm. 2 Ventricular Tachycardia runs occurred, the run with the fastest interval lasting 4 beats with a max rate of 156 bpm, the longest lasting 4 beats with an avg rate of 147 bpm. 50 Supraventricular Tachycardia runs occurred, the run with the fastest interval lasting 36.3 secs with a max rate of 167 bpm (avg 150 bpm); the run with the fastest interval was also the longest. Isolated SVEs were frequent (5.5%, 00938), SVE Couplets were rare (<1.0%, 3497), and SVE Triplets were rare (<1.0%, 910). Isolated VEs were rare (<1.0%, 1272), VE Triplets were rare (<1.0%, 3), and no VE Couplets were present. Ventricular Trigeminy was present.  Cardiac cath 07/17/21:  CORONARY STENT INTERVENTION  LEFT HEART CATH AND CORS/GRAFTS ANGIOGRAPHY   Conclusion      Ost LM to Mid LM lesion is 30% stenosed.   Prox LAD lesion is 45% stenosed.   Prox LAD to Mid LAD lesion is 75% stenosed.   Ost Cx to Prox Cx lesion is 90% stenosed.   Prox Cx to Mid Cx lesion is 60% stenosed.   Prox RCA to Dist RCA lesion is 100% stenosed.   Ost LAD lesion is 70% stenosed.   Origin to Prox Graft lesion is 100% stenosed.   2nd Mrg lesion is 80% stenosed.   Scoring balloon angioplasty was performed using a [place].   Post intervention, there is a 0% residual stenosis.   Origin lesion is  70% stenosed.   A drug-eluting stent was successfully placed using a SYNERGY XD 4.0X12.   Post intervention, there is a 0% residual stenosis.   LIMA graft was visualized by angiography and is normal in caliber.   SVG graft was visualized by angiography.   The graft exhibits no disease.   The left ventricular systolic function is normal.   LV end diastolic pressure is normal.   The left ventricular ejection fraction is 55-65% by visual estimate.   3 vessel obstructive CAD Patent LIMA to the LAD Occluded SVG to the first diagonal. The native diagonal has good flow SVG to the OM2 is patent but has stenosis at the aorto-ostium and at the distal anastomosis. CTO of the RCA Good LV function Normal LVEDP Successful PCI of the SVG to OM2 with DES at the ostial SVG and scoring balloon angioplasty of the distal anastomosis.   Plan: DAPT with ASA and Plavix for one year. Do not resume Eliquis. Anticipate same day DC. Coronary Diagrams  Diagnostic Dominance:  Right Intervention  Implants     Assessment / Plan: 1. Coronary disease with remote angioplasty of the LAD and left circumflex coronary  in 1991. Chronic total occlusion of the right coronary. Repeat evaluation with cardiac cath in February 2020 was stable.   He presented in March with  unstable angina. Cardiac cath showed progressive multivessel CAD. He subsequently underwent CABG by Dr Benjamin [name] with LIMA to the LAD, SVG to diagonal and SVG to OM. It was noted that the RCA branches were too small to graft. Repeat cardiac cath showed occlusion of SVG to diagonal. There was significant stenosis at the ostium of SVG to OM as well as stenosis at the distal anastomosis. Both successfully treated with PCI. LIMA to LAD patent. On DAPT with ASA and Plavix. He still has some angina class 2. On Imdur, ranexa and atenolol. Will increase Imdur 120 mg daily.   2. Hyperlipidemia. Continue on high-dose atorvastatin. LDL at goal 40.   3. Diabetes  mellitus type 2.  on metformin, Januvia, Jardiance and glimiperide. Needs to eliminate sugar/candy intake. Per primary care.   4. Tobacco abuse. Encourage complete smoking cessation.  5. Post op Afib. Converted on amiodarone. Intolerant of amiodarone due to GI side effects. Had recurrent Aflutter that converted spontaneously. Asymptomatic. No recurrence on event monitor.   Will follow up in 6 months.

## 2022-09-03 ENCOUNTER — Other Ambulatory Visit: Payer: Self-pay | Admitting: Cardiology

## 2022-09-03 ENCOUNTER — Encounter: Payer: Self-pay | Admitting: Cardiology

## 2022-09-03 ENCOUNTER — Ambulatory Visit: Payer: Medicare Other | Attending: Cardiology | Admitting: Cardiology

## 2022-09-03 VITALS — BP 110/60 | HR 69 | Ht 64.5 in | Wt 139.6 lb

## 2022-09-03 DIAGNOSIS — I25708 Atherosclerosis of coronary artery bypass graft(s), unspecified, with other forms of angina pectoris: Secondary | ICD-10-CM

## 2022-09-03 DIAGNOSIS — I25709 Atherosclerosis of coronary artery bypass graft(s), unspecified, with unspecified angina pectoris: Secondary | ICD-10-CM

## 2022-09-03 DIAGNOSIS — E7800 Pure hypercholesterolemia, unspecified: Secondary | ICD-10-CM

## 2022-09-03 DIAGNOSIS — E119 Type 2 diabetes mellitus without complications: Secondary | ICD-10-CM | POA: Diagnosis not present

## 2022-09-03 DIAGNOSIS — Z720 Tobacco use: Secondary | ICD-10-CM

## 2022-09-03 DIAGNOSIS — Z951 Presence of aortocoronary bypass graft: Secondary | ICD-10-CM

## 2022-09-03 MED ORDER — SODIUM CHLORIDE 0.9% FLUSH: 3.0000 mL | Freq: Two times a day (BID) | INTRAVENOUS | Status: DC

## 2022-09-03 MED ORDER — RANOLAZINE ER 1000 MG PO TB12: 1000.0000 mg | ORAL_TABLET | Freq: Two times a day (BID) | ORAL | 3 refills | Status: DC

## 2022-09-03 NOTE — Patient Instructions (Signed)
Medication Instructions:  Continue same medications *If you need a refill on your cardiac medications before your next appointment, please call your pharmacy*   Lab Work: Bmet,cbc,lipid and hepatic panels,A1c,pt to be done 1 week before cardiac cath   Testing/Procedures: Cardiac cath to be scheduled 10/06/22  I will call with a date to come to office to pick up cath instructions and have lab work.   Follow-Up: At [facility], you and your health needs are our priority.  As part of our continuing mission to provide you with exceptional heart care, we have created designated Provider Care Teams.  These Care Teams include your primary Cardiologist (physician) and Advanced Practice Providers (APPs -  Physician Assistants and Nurse Practitioners) who all work together to provide you with the care you need, when you need it.  We recommend signing up for the patient portal called "MyChart".  Sign up information is provided on this After Visit Summary.  MyChart is used to connect with patients for Virtual Visits (Telemedicine).  Patients are able to view lab/test results, encounter notes, upcoming appointments, etc.  Non-urgent messages can be sent to your provider as well.   To learn more about what you can do with MyChart, go to [email or web address].    Your next appointment:  After Cath    The format for your next appointment: Office   Provider: [place] [facility] [place] [id number] [place] [address] Dept: [phone] Loc: [phone]  Benjamin [name]  09/03/2022  You are scheduled for a Cardiac Catheterization on Tuesday, January 9 with Dr. Peter Martinique.  1. Please arrive at the [facility] (Main Entrance A) at [facility]: [address] [place], [place] [address] at 5:30 AM (This time is two hours before  your procedure to ensure your preparation). Free valet parking service is available.   Special note: Every effort is made to have your procedure done on time. Please understand that emergencies sometimes delay scheduled procedures.  2. Diet: Do not eat solid foods after midnight.  The patient may have clear liquids until 5am upon the day of the procedure.  3. Labs: You will need to have blood drawn on Tuesday 09/29/22 at [place] office [facility] between 8:00 am to 12:00 noon.No food.Water only.No appointment needed.  4. Medication instructions in preparation for your procedure:      Hold Metformin morning of cath and hold 2 days after cath. Hold Glimepiride morning of cath.      On the morning of your procedure, take your Aspirin 81 mg and Plavix/Clopidogrel and any morning medicines NOT listed above.  You may use sips of water.  5. Plan for one night stay--bring personal belongings. 6. Bring a current list of your medications and current insurance cards. 7. You MUST have a responsible person to drive you home. 8. Someone MUST be with you the first 24 hours after you arrive home or your discharge will be delayed. 9. Please wear clothes that are easy to get on and off and wear slip-on shoes.  Thank you for allowing [place] to care for you!   -- [facility]  Important Information About Sugar

## 2022-09-03 NOTE — Addendum Note (Signed)
Addended by: Kathyrn Lass on: 09/03/2022 01:53 PM   Modules accepted: Orders

## 2022-09-18 ENCOUNTER — Other Ambulatory Visit: Payer: Self-pay | Admitting: Cardiology

## 2022-09-24 ENCOUNTER — Other Ambulatory Visit: Payer: Self-pay | Admitting: Student

## 2022-09-24 NOTE — Telephone Encounter (Signed)
This is Dr. Jordan's pt. °

## 2022-09-27 ENCOUNTER — Other Ambulatory Visit: Payer: Self-pay | Admitting: Cardiology

## 2022-09-29 DIAGNOSIS — I25709 Atherosclerosis of coronary artery bypass graft(s), unspecified, with unspecified angina pectoris: Secondary | ICD-10-CM | POA: Diagnosis not present

## 2022-09-29 DIAGNOSIS — E119 Type 2 diabetes mellitus without complications: Secondary | ICD-10-CM | POA: Diagnosis not present

## 2022-09-29 DIAGNOSIS — E7800 Pure hypercholesterolemia, unspecified: Secondary | ICD-10-CM | POA: Diagnosis not present

## 2022-09-29 DIAGNOSIS — Z951 Presence of aortocoronary bypass graft: Secondary | ICD-10-CM | POA: Diagnosis not present

## 2022-09-29 DIAGNOSIS — Z720 Tobacco use: Secondary | ICD-10-CM | POA: Diagnosis not present

## 2022-09-29 DIAGNOSIS — Z01818 Encounter for other preprocedural examination: Secondary | ICD-10-CM | POA: Diagnosis not present

## 2022-09-29 LAB — CBC WITH DIFFERENTIAL/PLATELET

## 2022-09-30 LAB — CBC WITH DIFFERENTIAL/PLATELET
Basophils Absolute: 0.1 10*3/uL (ref 0.0–0.2)
Basos: 1 %
EOS (ABSOLUTE): 0.1 10*3/uL (ref 0.0–0.4)
Eos: 1 %
Hematocrit: 40.8 % (ref 37.5–51.0)
Hemoglobin: 12.4 g/dL — ABNORMAL LOW (ref 13.0–17.7)
Immature Grans (Abs): 0.1 10*3/uL (ref 0.0–0.1)
Immature Granulocytes: 1 %
Lymphocytes Absolute: 2.2 10*3/uL (ref 0.7–3.1)
Lymphs: 26 %
MCH: 22.1 pg — ABNORMAL LOW (ref 26.6–33.0)
MCHC: 30.4 g/dL — ABNORMAL LOW (ref 31.5–35.7)
MCV: 73 fL — ABNORMAL LOW (ref 79–97)
Monocytes Absolute: 0.7 10*3/uL (ref 0.1–0.9)
Monocytes: 8 %
Neutrophils Absolute: 5.5 10*3/uL (ref 1.4–7.0)
Neutrophils: 63 %
Platelets: 235 10*3/uL (ref 150–450)
RBC: 5.6 x10E6/uL (ref 4.14–5.80)
RDW: 20.1 % — ABNORMAL HIGH (ref 11.6–15.4)
WBC: 8.6 10*3/uL (ref 3.4–10.8)

## 2022-09-30 LAB — HEPATIC FUNCTION PANEL
ALT: 9 IU/L (ref 0–44)
AST: 9 IU/L (ref 0–40)
Albumin: 4.3 g/dL (ref 3.8–4.8)
Alkaline Phosphatase: 56 IU/L (ref 44–121)
Bilirubin Total: 0.5 mg/dL (ref 0.0–1.2)
Bilirubin, Direct: 0.19 mg/dL (ref 0.00–0.40)
Total Protein: 7.1 g/dL (ref 6.0–8.5)

## 2022-09-30 LAB — LIPID PANEL
Chol/HDL Ratio: 2.2 ratio (ref 0.0–5.0)
Cholesterol, Total: 107 mg/dL (ref 100–199)
HDL: 49 mg/dL (ref >39)
LDL Chol Calc (NIH): 45 mg/dL (ref 0–99)
Triglycerides: 57 mg/dL (ref 0–149)
VLDL Cholesterol Cal: 13 mg/dL (ref 5–40)

## 2022-09-30 LAB — BASIC METABOLIC PANEL
BUN/Creatinine Ratio: 16 (ref 10–24)
BUN: 13 mg/dL (ref 8–27)
CO2: 22 mmol/L (ref 20–29)
Calcium: 9.1 mg/dL (ref 8.6–10.2)
Chloride: 104 mmol/L (ref 96–106)
Creatinine, Ser: 0.81 mg/dL (ref 0.76–1.27)
Glucose: 139 mg/dL — ABNORMAL HIGH (ref 70–99)
Potassium: 4.8 mmol/L (ref 3.5–5.2)
Sodium: 140 mmol/L (ref 134–144)
eGFR: 91 mL/min/{1.73_m2} (ref >59)

## 2022-09-30 LAB — HEMOGLOBIN A1C
Est. average glucose Bld gHb Est-mCnc: 160 mg/dL
Hgb A1c MFr Bld: 7.2 % — ABNORMAL HIGH (ref 4.8–5.6)

## 2022-09-30 LAB — PT AND PTT
INR: 1 (ref 0.9–1.2)
Prothrombin Time: 10.5 s (ref 9.1–12.0)
aPTT: 26 s (ref 24–33)

## 2022-10-05 ENCOUNTER — Telehealth: Payer: Self-pay | Admitting: *Deleted

## 2022-10-05 NOTE — Telephone Encounter (Signed)
Cardiac Catheterization scheduled at [facility] for: Tuesday October 06, 2021 7:30 AM Arrival time and place: [place] Entrance A at: 5:30 AM  Nothing to eat after midnight prior to procedure, clear liquids until 5 AM day of procedure.  Medication instructions: -Hold:  Metformin-day of procedure and 48 hours post procedure  Glimepiride/Januvia/Farxiga-AM of procedure -Except hold medications usual morning medications can be taken with sips of water including aspirin 81 mg and Plavix 75 mg.  Confirmed patient has responsible adult to drive home post procedure and be with patient first 24 hours after arriving home.  Patient reports no new symptoms concerning for COVID-19 in the past 10 days.  Reviewed procedure instructions with patient.

## 2022-10-06 ENCOUNTER — Other Ambulatory Visit: Payer: Self-pay

## 2022-10-06 ENCOUNTER — Encounter (HOSPITAL_COMMUNITY)
Admission: RE | Disposition: A | Discharge status: Home / Self Care | Payer: Self-pay | Source: Ambulatory Visit | Attending: Cardiology

## 2022-10-06 ENCOUNTER — Ambulatory Visit (HOSPITAL_COMMUNITY)
Admission: RE | Admit: 2022-10-06 | Discharge: 2022-10-06 | Disposition: A | Discharge status: Home / Self Care | Payer: Medicare Other | Source: Ambulatory Visit | Attending: Cardiology | Admitting: Cardiology

## 2022-10-06 ENCOUNTER — Encounter (HOSPITAL_COMMUNITY): Payer: Self-pay | Admitting: Cardiology

## 2022-10-06 DIAGNOSIS — Z7902 Long term (current) use of antithrombotics/antiplatelets: Secondary | ICD-10-CM | POA: Diagnosis not present

## 2022-10-06 DIAGNOSIS — Z7984 Long term (current) use of oral hypoglycemic drugs: Secondary | ICD-10-CM | POA: Insufficient documentation

## 2022-10-06 DIAGNOSIS — I2582 Chronic total occlusion of coronary artery: Secondary | ICD-10-CM | POA: Insufficient documentation

## 2022-10-06 DIAGNOSIS — I25708 Atherosclerosis of coronary artery bypass graft(s), unspecified, with other forms of angina pectoris: Secondary | ICD-10-CM | POA: Diagnosis not present

## 2022-10-06 DIAGNOSIS — E785 Hyperlipidemia, unspecified: Secondary | ICD-10-CM | POA: Insufficient documentation

## 2022-10-06 DIAGNOSIS — Z7982 Long term (current) use of aspirin: Secondary | ICD-10-CM | POA: Insufficient documentation

## 2022-10-06 DIAGNOSIS — I2584 Coronary atherosclerosis due to calcified coronary lesion: Secondary | ICD-10-CM | POA: Diagnosis not present

## 2022-10-06 DIAGNOSIS — Z955 Presence of coronary angioplasty implant and graft: Secondary | ICD-10-CM | POA: Insufficient documentation

## 2022-10-06 DIAGNOSIS — E119 Type 2 diabetes mellitus without complications: Secondary | ICD-10-CM | POA: Diagnosis not present

## 2022-10-06 DIAGNOSIS — Z79899 Other long term (current) drug therapy: Secondary | ICD-10-CM | POA: Diagnosis not present

## 2022-10-06 DIAGNOSIS — F17210 Nicotine dependence, cigarettes, uncomplicated: Secondary | ICD-10-CM | POA: Diagnosis not present

## 2022-10-06 DIAGNOSIS — I25118 Atherosclerotic heart disease of native coronary artery with other forms of angina pectoris: Secondary | ICD-10-CM | POA: Diagnosis not present

## 2022-10-06 HISTORY — PX: LEFT HEART CATH AND CORS/GRAFTS ANGIOGRAPHY: CATH118250

## 2022-10-06 LAB — GLUCOSE, CAPILLARY
Glucose-Capillary: 135 mg/dL — ABNORMAL HIGH (ref 70–99)
Glucose-Capillary: 98 mg/dL (ref 70–99)

## 2022-10-06 SURGERY — LEFT HEART CATH AND CORS/GRAFTS ANGIOGRAPHY
Anesthesia: LOCAL

## 2022-10-06 MED ORDER — SODIUM CHLORIDE 0.9 % IV SOLN: 250.0000 mL | INTRAVENOUS | Status: DC | PRN

## 2022-10-06 MED ORDER — ACETAMINOPHEN 325 MG PO TABS: 650.0000 mg | ORAL_TABLET | ORAL | Status: DC | PRN

## 2022-10-06 MED ORDER — SODIUM CHLORIDE 0.9% FLUSH: 3.0000 mL | Freq: Two times a day (BID) | INTRAVENOUS | Status: DC

## 2022-10-06 MED ORDER — MIDAZOLAM HCL 2 MG/2ML IJ SOLN
INTRAMUSCULAR | Status: AC
  Filled 2022-10-06: qty 2

## 2022-10-06 MED ORDER — LIDOCAINE HCL (PF) 1 % IJ SOLN
INTRAMUSCULAR | Status: AC
  Filled 2022-10-06: qty 30

## 2022-10-06 MED ORDER — ONDANSETRON HCL 4 MG/2ML IJ SOLN: 4.0000 mg | Freq: Four times a day (QID) | INTRAMUSCULAR | Status: DC | PRN

## 2022-10-06 MED ORDER — IOHEXOL 350 MG/ML SOLN
INTRAVENOUS | Status: DC | PRN
  Administered 2022-10-06: 110 mL

## 2022-10-06 MED ORDER — ASPIRIN 81 MG PO CHEW: 81.0000 mg | CHEWABLE_TABLET | ORAL | Status: DC

## 2022-10-06 MED ORDER — SODIUM CHLORIDE 0.9 % WEIGHT BASED INFUSION
3.0000 mL/kg/h | INTRAVENOUS | Status: AC
  Administered 2022-10-06: 3 mL/kg/h via INTRAVENOUS

## 2022-10-06 MED ORDER — HEPARIN SODIUM (PORCINE) 1000 UNIT/ML IJ SOLN
INTRAMUSCULAR | Status: DC | PRN
  Administered 2022-10-06: 3000 [IU] via INTRAVENOUS

## 2022-10-06 MED ORDER — MIDAZOLAM HCL 2 MG/2ML IJ SOLN
INTRAMUSCULAR | Status: DC | PRN
  Administered 2022-10-06 (×2): 1 mg via INTRAVENOUS

## 2022-10-06 MED ORDER — HEPARIN SODIUM (PORCINE) 1000 UNIT/ML IJ SOLN
INTRAMUSCULAR | Status: AC
  Filled 2022-10-06: qty 10

## 2022-10-06 MED ORDER — SODIUM CHLORIDE 0.9% FLUSH: 3.0000 mL | INTRAVENOUS | Status: DC | PRN

## 2022-10-06 MED ORDER — VERAPAMIL HCL 2.5 MG/ML IV SOLN
INTRAVENOUS | Status: DC | PRN
  Administered 2022-10-06: 10 mL via INTRA_ARTERIAL

## 2022-10-06 MED ORDER — LIDOCAINE HCL (PF) 1 % IJ SOLN
INTRAMUSCULAR | Status: DC | PRN
  Administered 2022-10-06: 5 mL via INTRADERMAL

## 2022-10-06 MED ORDER — METFORMIN HCL 1000 MG PO TABS: 1000.0000 mg | ORAL_TABLET | Freq: Two times a day (BID) | ORAL | Status: AC

## 2022-10-06 MED ORDER — SODIUM CHLORIDE 0.9 % WEIGHT BASED INFUSION: 1.0000 mL/kg/h | INTRAVENOUS | Status: DC

## 2022-10-06 MED ORDER — FENTANYL CITRATE (PF) 100 MCG/2ML IJ SOLN
INTRAMUSCULAR | Status: DC | PRN
  Administered 2022-10-06 (×2): 25 ug via INTRAVENOUS

## 2022-10-06 MED ORDER — HEPARIN (PORCINE) IN NACL 1000-0.9 UT/500ML-% IV SOLN
INTRAVENOUS | Status: DC | PRN
  Administered 2022-10-06 (×2): 500 mL

## 2022-10-06 MED ORDER — FENTANYL CITRATE (PF) 100 MCG/2ML IJ SOLN
INTRAMUSCULAR | Status: AC
  Filled 2022-10-06: qty 2

## 2022-10-06 MED ORDER — HEPARIN (PORCINE) IN NACL 1000-0.9 UT/500ML-% IV SOLN
INTRAVENOUS | Status: AC
  Filled 2022-10-06: qty 1000

## 2022-10-06 MED ORDER — VERAPAMIL HCL 2.5 MG/ML IV SOLN
INTRAVENOUS | Status: AC
  Filled 2022-10-06: qty 2

## 2022-10-06 SURGICAL SUPPLY — 11 items
CATH INFINITI 5 FR IM (CATHETERS) IMPLANT
CATH INFINITI 5FR AL1 (CATHETERS) IMPLANT
CATH INFINITI 5FR MULTPACK ANG (CATHETERS) IMPLANT
DEVICE RAD COMP TR BAND LRG (VASCULAR PRODUCTS) IMPLANT
GLIDESHEATH SLEND SS 6F .021 (SHEATH) IMPLANT
GUIDEWIRE INQWIRE 1.5J.035X260 (WIRE) IMPLANT
INQWIRE 1.5J .035X260CM (WIRE) ×1
KIT HEART LEFT (KITS) ×1 IMPLANT
PACK CARDIAC CATHETERIZATION (CUSTOM PROCEDURE TRAY) ×1 IMPLANT
TRANSDUCER W/STOPCOCK (MISCELLANEOUS) ×1 IMPLANT
TUBING CIL FLEX 10 FLL-RA (TUBING) ×1 IMPLANT

## 2022-10-06 NOTE — H&P (Signed)
Cardiology Admission History and Physical   Patient ID: HOLBERT CAPLES MRN: [id number]; DOB: 18-Mar-1946   Admission date: 10/06/2022  PCP:  Alroy Dust, L.Marlou Sa, [place] Providers Cardiologist:  None        Chief Complaint:  chest pain  Patient Profile:   Benjamin [name] is a 77 y.o. male with extensive coronary history who is being seen 10/06/2022 for the evaluation of angina pectoris.Marland Kitchen  History of Present Illness:   Mr. Rotert  has a history of coronary disease with prior angioplasty of the proximal LAD in 1991. He also had angioplasty of the circumflex at that time. He has known occlusion of the right coronary with collateral flow. Last Myoview study in October 2015 showed a small anterolateral scar. No ischemia.     In 2020 he was seen with symptoms of angina.  He had a Myoview study showing anterior ischemia. This led to a cardiac cath in February 2020 showing occluded RCA and moderate nonobstructive disease in the LAD and LCx. Unchanged from prior study. Medical therapy recommended.    He was on maximal antianginal therapy with  aspirin, atenolol, and isosorbide, Ranexa. He presented in March 2022 with  unstable angina. Cardiac cath showed progressive multivessel CAD. He subsequently underwent CABG by Dr Roxan Hockey with LIMA to the LAD, SVG to diagonal and SVG to OM. It was noted that the RCA branches were too small to graft. His post op course was fairly uneventful. He had Afib on post op day #3 but converted on amiodarone. He had post op anemia. When I saw him last we discontinued amiodarone due to Nausea and poor appetite. When seen by Dr Roxan Hockey on May 10 he was in Atrial flutter with rate 150. He was asymptomatic. Was seen in the Afib clinic and was back in NSR. Atenolol dose was increased. Event monitor was placed showing a single episode of SVT lasting 36 seconds.    He was seen on September 22 by Dr Percival Spanish for evaluation of exertional chest pain. He was  started on Imdur and is seen back to evaluate response. He states that this helped about a week but then his pain returned. This is described as burning mid sternal pain that radiates to his left jaw and arm. It is his typical angina. It is relieved with rest and sl Ntg. It is associated with exertion- just walking to his mailbox.    He underwent cardiac cath on 07/17/21. This showed patent LIMA to the LAD. The SVG to the first diagonal was occluded but the diagonal had good antegrade flow. The SVG to OM showed aorto-ostial stenosis as well as stenosis at the distal anastomosis. The ostial lesion was successfully stented and the distal lesion was treated with a cutting balloon with good result. His anginal symptoms improved post PCI.     On follow up last month he reports that over the past 6 months he has recurrent angina. This is a burning sensation in the upper chest radiating to his jaw and left arm. Relieved with sl Ntg. Typically occurs with exertion such as going up stairs or taking out the trash. No dyspnea, edema, palpitations. last episode of angina yesterday relieved with sl ntg x 2     Past Medical History:  Diagnosis Date   Asthma    as a child, no problems as an adult, no inhaler   Coronary artery disease    Prior PCI to the prox LAD in 1991; S/P  PCI to LCX in October 1991   Diabetes mellitus    TYPE 2   GERD (gastroesophageal reflux disease)    occasional - diet controlled   History of kidney stones    many yrs ago   Hyperlipidemia    Tobacco abuse    quit 09/2020 - smoked for 64 yrs    Past Surgical History:  Procedure Laterality Date   COLONOSCOPY WITH PROPOFOL N/A 04/21/2016   Procedure: COLONOSCOPY WITH PROPOFOL;  Surgeon: Garlan Fair, MD;  Location: [facility];  Service: Endoscopy;  Laterality: N/A;   CORONARY ANGIOPLASTY  05/1990   PROXIMAL LAD   CORONARY ANGIOPLASTY  06/1990   LEFT CIRCUMFLEX CORONARY   CORONARY ARTERY BYPASS GRAFT N/A 12/26/2020    Procedure: CORONARY ARTERY BYPASS GRAFTING (CABG), ON PUMP, TIMES THREE, USING LEFT INTERNAL MAMMARY ARTERY AND ENDOSCOPICALLY HARVESTED RIGHT GREATER SAPHENOUS VEIN;  Surgeon: Melrose Nakayama, MD;  Location: [place];  Service: Open Heart Surgery;  Laterality: N/A;   CORONARY STENT INTERVENTION N/A 07/17/2021   Procedure: CORONARY STENT INTERVENTION;  Surgeon: Martinique, Merion Grimaldo M, MD;  Location: [place] [facility];  Service: Cardiovascular;  Laterality: N/A;   KIDNEY STONE SURGERY Bilateral    x 2 surgeries   LEFT HEART CATH AND CORONARY ANGIOGRAPHY N/A 11/18/2018   Procedure: LEFT HEART CATH AND CORONARY ANGIOGRAPHY;  Surgeon: Martinique, Kysha Muralles M, MD;  Location: [place] [facility];  Service: Cardiovascular;  Laterality: N/A;   LEFT HEART CATH AND CORONARY ANGIOGRAPHY N/A 12/17/2020   Procedure: LEFT HEART CATH AND CORONARY ANGIOGRAPHY;  Surgeon: Martinique, Cayce Paschal M, MD;  Location: [place] [facility];  Service: Cardiovascular;  Laterality: N/A;   LEFT HEART CATH AND CORS/GRAFTS ANGIOGRAPHY N/A 07/17/2021   Procedure: LEFT HEART CATH AND CORS/GRAFTS ANGIOGRAPHY;  Surgeon: Martinique, Makaley Storts M, MD;  Location: [place] [facility];  Service: Cardiovascular;  Laterality: N/A;   TEE WITHOUT CARDIOVERSION N/A 12/26/2020   Procedure: TRANSESOPHAGEAL ECHOCARDIOGRAM (TEE);  Surgeon: Melrose Nakayama, MD;  Location: [place];  Service: Open Heart Surgery;  Laterality: N/A;   UMBILICAL HERNIA REPAIR       Medications Prior to Admission: Prior to Admission medications   Medication Sig Start Date End Date Taking? Authorizing Provider  aspirin 81 MG EC tablet Take 1 tablet (81 mg total) by mouth daily. 02/05/21  Yes Sherran Needs, NP  atenolol (TENORMIN) 25 MG tablet TAKE 1 TABLET BY MOUTH EVERY MORNING AND ONE-HALF TABLET IN THE EVENING 04/09/22  Yes Martinique, Chinelo Benn M, MD  atorvastatin (LIPITOR) 80 MG tablet Take 1 tablet (80 mg total) by mouth daily. 09/18/22  Yes Martinique, Mosiah Bastin M, MD  clopidogrel (PLAVIX) 75 MG tablet TAKE  1 TABLET BY MOUTH EVERY DAY WITH BREAKFAST 09/24/22  Yes Martinique, Daijah Scrivens M, MD  Clotrimazole [facility] EX) Apply 1 Application topically daily.   Yes [provider]  docusate sodium (COLACE) 50 MG capsule Take 50 mg by mouth at bedtime.   Yes [provider]  FARXIGA 10 MG TABS tablet Take 10 mg by mouth in the morning. 06/26/21  Yes [provider]  glimepiride (AMARYL) 4 MG tablet Take 4 mg by mouth daily with breakfast.   Yes [provider]  isosorbide mononitrate (IMDUR) 120 MG 24 hr tablet Take 1 tablet (120 mg total) by mouth daily. 11/03/21  Yes Martinique, Fraya Ueda M, MD  JANUVIA 100 MG tablet Take 100 mg by mouth in the morning. 06/22/13  Yes [provider]  metFORMIN (GLUCOPHAGE)  1000 MG tablet Take 1 tablet (1,000 mg total) by mouth 2 (two) times daily. 12/20/20  Yes Martinique, Shelanda Duvall M, MD  nitroGLYCERIN (NITROSTAT) 0.4 MG SL tablet Place 1 tablet (0.4 mg total) under the tongue every 5 (five) minutes as needed for chest pain. 07/17/21 10/06/22 Yes Sande Rives E, PA-C  ranolazine (RANEXA) 1000 MG SR tablet Take 1 tablet (1,000 mg total) by mouth 2 (two) times daily. 09/03/22  Yes Martinique, Eloy Fehl M, MD  Lancets [facility] DELICA PLUS [id number]) MISC Apply 1 each topically daily. 02/03/21   [provider]     Allergies:    Allergies  Allergen Reactions   Sulfa Drugs Cross Reactors Rash    Social History:   Social History   Socioeconomic History   Marital status: Married    Spouse name: Not on file   Number of children: 2   Years of education: 12   Highest education level: Not on file  Occupational History   Occupation: [employer]    Comment: retired  Tobacco Use   Smoking status: Former    Packs/day: 1.00    Years: 64.00    Total pack years: 64.00    Types: Cigarettes    Quit date: 09/28/2020    Years since quitting: 2.0   Smokeless tobacco: Never  Vaping Use   Vaping Use: Never used  Substance and Sexual Activity    Alcohol use: Yes    Alcohol/week: 3.0 - 4.0 standard drinks of alcohol    Types: 3 - 4 Standard drinks or equivalent per week    Comment: every other weekend - liquor   Drug use: No   Sexual activity: Yes  Other Topics Concern   Not on file  Social History Narrative   Not on file   Social Determinants of Health   Financial Resource Strain: Not on file  Food Insecurity: Not on file  Transportation Needs: Not on file  Physical Activity: Not on file  Stress: Not on file  Social Connections: Not on file  Intimate Partner Violence: Not on file    Family History:   The patient's family history includes Heart disease in his sister.    ROS:  Please see the history of present illness.  All other ROS reviewed and negative.     Physical Exam/Data:   Vitals:   10/06/22 0546  BP: (!) 143/63  Pulse: 66  Resp: 17  Temp: (!) 97.5 F (36.4 C)  TempSrc: Temporal  SpO2: 97%  Weight: 61.2 kg  Height: '5\' 5"'$  (1.651 m)   No intake or output data in the 24 hours ending 10/06/22 0656    10/06/2022    5:46 AM 09/03/2022   11:01 AM 04/23/2022    4:23 PM  Last 3 Weights  Weight (lbs) 135 lb 139 lb 9.6 oz 134 lb 11.2 oz  Weight (kg) 61.236 kg 63.322 kg 61.1 kg     Body mass index is 22.47 kg/m.  General:  Well nourished, well developed, in no acute distress HEENT: normal Neck: no JVD Vascular: No carotid bruits; Distal pulses 2+ bilaterally   Cardiac:  normal S1, S2; RRR; no murmur  Lungs:  clear to auscultation bilaterally, no wheezing, rhonchi or rales  Abd: soft, nontender, no hepatomegaly  Ext: no edema Musculoskeletal:  No deformities, BUE and BLE strength normal and equal Skin: warm and dry  Neuro:  CNs 2-12 intact, no focal abnormalities noted Psych:  Normal affect    EKG:  The ECG that  was done today was personally reviewed and demonstrates NSR rate 63 with first degree AV block and RBBB.   Relevant CV Studies: Myoview 11/09/18: Study Highlights      The left  ventricular ejection fraction is normal (55-65%). Nuclear stress EF: 59%. Blood pressure demonstrated a normal response to exercise. There was no ST segment deviation noted during stress. No T wave inversion was noted during stress. Defect 1: There is a small defect of moderate severity present in the mid anterolateral, apical lateral and apex location. Findings consistent with ischemia. This is an intermediate risk study.   The anterolateral perfusion defect on the prior exam was interpreted as fixed, however on today's exam appears to have reversible ischemia. Wall motion appears abnormal in this region.      Cardiac cath 11/18/18:  LEFT HEART CATH AND CORONARY ANGIOGRAPHY  Conclusion      Prox LAD lesion is 45% stenosed. Prox LAD to Mid LAD lesion is 65% stenosed. Ost Cx to Prox Cx lesion is 50% stenosed. Prox Cx to Mid Cx lesion is 50% stenosed. Prox RCA to Dist RCA lesion is 100% stenosed. The left ventricular systolic function is normal. LV end diastolic pressure is normal. The left ventricular ejection fraction is 55-65% by visual estimate.   1. Single vessel occlusive CAD with CTO of the RCA. Left to right collaterals. There is diffuse moderate calcific disease involving the LAD and LCx that is not significantly changed since 2011. 2. Normal LV function 3. Normal LV EDP   Plan: continued medical therapy    Cardiac cath 12/17/20:  LEFT HEART CATH AND CORONARY ANGIOGRAPHY      Conclusion     Prox LAD lesion is 45% stenosed. Prox LAD to Mid LAD lesion is 75% stenosed. Ost Cx to Prox Cx lesion is 90% stenosed. Prox Cx to Mid Cx lesion is 60% stenosed. Prox RCA to Dist RCA lesion is 100% stenosed. Ost LM to Mid LM lesion is 30% stenosed. Ost LAD lesion is 80% stenosed. The left ventricular systolic function is normal. LV end diastolic pressure is normal. The left ventricular ejection fraction is 55-65% by visual estimate.   1. Severe 3 vessel obstructive CAD.  Patient has CTO of the RCA. Progressive ostial LAD and LCX disease. Severely calcified vessels 2. Normal LV function 3. Normal LVEDP   Plan: recommend referral to CT surgery for consideration of CABG.    Coronary Diagrams     Diagnostic Dominance: Right      Intervention     Intraop TEE 1/93/79: Complications: No known complications during this procedure.  POST-OP IMPRESSIONS  - Left Ventricle: The left ventricle is unchanged from pre-bypass.  - Right Ventricle: The right ventricle appears unchanged from pre-bypass.  - Aorta: The aorta appears unchanged from pre-bypass.  - Left Atrium: The left atrium appears unchanged from pre-bypass.  - Left Atrial Appendage: The left atrial appendage appears unchanged from  pre-bypass.  - Aortic Valve: The aortic valve appears unchanged from pre-bypass.  - Mitral Valve: The mitral valve appears unchanged from pre-bypass.  - Tricuspid Valve: The tricuspid valve appears unchanged from pre-bypass.  - Interatrial Septum: The interatrial septum appears unchanged from  pre-bypass.  - Interventricular Septum: The interventricular septum appears unchanged  from  pre-bypass.  - Pericardium: The pericardium appears unchanged from pre-bypass.   PRE-OP FINDINGS   Left Ventricle: The left ventricle has normal systolic function, with an  ejection fraction of 55-60%. The cavity size was normal. There is no left  ventricular hypertrophy.    Right Ventricle: The right ventricle has normal systolic function. The  cavity was normal. There is no increase in right ventricular wall  thickness.   Left Atrium: Left atrial size was normal in size. No left atrial/left  atrial appendage thrombus was detected.   Right Atrium: Right atrial size was normal in size.   Interatrial Septum: No atrial level shunt detected by color flow Doppler.   Pericardium: There is no evidence of pericardial effusion.   Mitral Valve: The mitral valve is normal in structure.  Mitral valve  regurgitation is not visualized by color flow Doppler. There is No  evidence of mitral stenosis.   Tricuspid Valve: The tricuspid valve was normal in structure. Tricuspid  valve regurgitation was not visualized by color flow Doppler.   Aortic Valve: The aortic valve is tricuspid Aortic valve regurgitation was  not visualized by color flow Doppler. There is no stenosis of the aortic  valve.    Pulmonic Valve: The pulmonic valve was not assessed.  Pulmonic valve regurgitation was not assessed by color flow Doppler.       Renold Don MD    Event monitor 03/04/21: Study Highlights     Patch Wear Time:  14 days and 0 hours (2022-05-18T15:15:55-0400 to 2022-06-01T15:15:59-0400)   Patient had a min HR of 49 bpm, max HR of 167 bpm, and avg HR of 67 bpm. Predominant underlying rhythm was Sinus Rhythm. 2 Ventricular Tachycardia runs occurred, the run with the fastest interval lasting 4 beats with a max rate of 156 bpm, the longest lasting 4 beats with an avg rate of 147 bpm. 50 Supraventricular Tachycardia runs occurred, the run with the fastest interval lasting 36.3 secs with a max rate of 167 bpm (avg 150 bpm); the run with the fastest interval was also the longest. Isolated SVEs were frequent (5.5%, 09323), SVE Couplets were rare (<1.0%, 3497), and SVE Triplets were rare (<1.0%, 910). Isolated VEs were rare (<1.0%, 1272), VE Triplets were rare (<1.0%, 3), and no VE Couplets were present. Ventricular Trigeminy was present.   Cardiac cath 07/17/21:  CORONARY STENT INTERVENTION  LEFT HEART CATH AND CORS/GRAFTS ANGIOGRAPHY    Conclusion       Ost LM to Mid LM lesion is 30% stenosed.   Prox LAD lesion is 45% stenosed.   Prox LAD to Mid LAD lesion is 75% stenosed.   Ost Cx to Prox Cx lesion is 90% stenosed.   Prox Cx to Mid Cx lesion is 60% stenosed.   Prox RCA to Dist RCA lesion is 100% stenosed.   Ost LAD lesion is 70% stenosed.   Origin to Prox Graft lesion is 100%  stenosed.   2nd Mrg lesion is 80% stenosed.   Scoring balloon angioplasty was performed using a Alsey.   Post intervention, there is a 0% residual stenosis.   Origin lesion is 70% stenosed.   A drug-eluting stent was successfully placed using a SYNERGY XD 4.0X12.   Post intervention, there is a 0% residual stenosis.   LIMA graft was visualized by angiography and is normal in caliber.   SVG graft was visualized by angiography.   The graft exhibits no disease.   The left ventricular systolic function is normal.   LV end diastolic pressure is normal.   The left ventricular ejection fraction is 55-65% by visual estimate.   3 vessel obstructive CAD Patent LIMA to the LAD Occluded SVG to the first diagonal. The native diagonal has good  flow SVG to the OM2 is patent but has stenosis at the aorto-ostium and at the distal anastomosis. CTO of the RCA Good LV function Normal LVEDP Successful PCI of the SVG to OM2 with DES at the ostial SVG and scoring balloon angioplasty of the distal anastomosis.   Plan: DAPT with ASA and Plavix for one year. Do not resume Eliquis. Anticipate same day DC. Coronary Diagrams   Diagnostic Dominance: Right Intervention   Implants       Laboratory Data:  High Sensitivity Troponin:  No results for input(s): "TROPONINIHS" in the last 720 hours.    Chemistry Recent Labs  Lab 09/29/22 0849  NA 140  K 4.8  CL 104  CO2 22  GLUCOSE 139*  BUN 13  CREATININE 0.81  CALCIUM 9.1    Recent Labs  Lab 09/29/22 0849  PROT 7.1  ALBUMIN 4.3  AST 9  ALT 9  ALKPHOS 56  BILITOT 0.5   Lipids  Recent Labs  Lab 09/29/22 0849  CHOL 107  TRIG 57  HDL 49  LABVLDL 13  LDLCALC 45  CHOLHDL 2.2   Hematology Recent Labs  Lab 09/29/22 0849  WBC 8.6  RBC 5.60  HGB 12.4*  HCT 40.8  MCV 73*  MCH 22.1*  MCHC 30.4*  RDW 20.1*  PLT 235   Thyroid No results for input(s): "TSH", "FREET4" in the last 168 hours. BNPNo results for  input(s): "BNP", "PROBNP" in the last 168 hours.  DDimer No results for input(s): "DDIMER" in the last 168 hours.   Radiology/Studies:  No results found.   Assessment and Plan:   Coronary disease with remote angioplasty of the LAD and left circumflex coronary  in 1991. Chronic total occlusion of the right coronary. Repeat evaluation with cardiac cath in February 2020 was stable.   He presented in March 2022 with  unstable angina. Cardiac cath showed progressive multivessel CAD. He subsequently underwent CABG by Dr Roxan Hockey with LIMA to the LAD, SVG to diagonal and SVG to OM. It was noted that the RCA branches were too small to graft. Repeat cardiac cath in October 2022 showed occlusion of SVG to diagonal. There was significant stenosis at the ostium of SVG to OM as well as stenosis at the distal anastomosis. Both successfully treated with PCI. LIMA to LAD patent. On DAPT with ASA and Plavix. He now has recurrent class 3 angina despite optimal medical therapy. I am concerned about risk of restenosis in SVG to OM. Recommend repeat cardiac cath with possible PCI if indicated. The procedure and risks were reviewed including but not limited to death, myocardial infarction, stroke, arrythmias, bleeding, transfusion, emergency surgery, dye allergy, or renal dysfunction. The patient voices understanding and is agreeable to proceed.  Hyperlipidemia. Continue on high-dose atorvastatin. LDL at goal 45.   Diabetes mellitus type 2.  on metformin, Januvia, Jardiance and glimiperide. Needs to eliminate sugar/candy intake. Per primary care.   Tobacco abuse. Encourage complete smoking cessation.  Post op Afib. Converted on amiodarone. Intolerant of amiodarone due to GI side effects. Had recurrent Aflutter that converted spontaneously. Asymptomatic. No recurrence on event monitor.     Risk Assessment/Risk Scores:       For questions or updates, please contact [place] Please consult  [email or web address] for contact info under     Signed, Toya Palacios Martinique, MD  10/06/2022 6:56 AM

## 2022-10-06 NOTE — Interval H&P Note (Signed)
History and Physical Interval Note:  10/06/2022 7:14 AM  Benjamin [name]  has presented today for surgery, with the diagnosis of Chest pain.  The various methods of treatment have been discussed with the patient and family. After consideration of risks, benefits and other options for treatment, the patient has consented to  Procedure(s): LEFT HEART CATH AND CORS/GRAFTS ANGIOGRAPHY (N/A) as a surgical intervention.  The patient's history has been reviewed, patient examined, no change in status, stable for surgery.  I have reviewed the patient's chart and labs.  Questions were answered to the patient's satisfaction.   Cath Lab Visit (complete for each Cath Lab visit)  Clinical Evaluation Leading to the Procedure:   ACS: No.  Non-ACS:    Anginal Classification: CCS III  Anti-ischemic medical therapy: Maximal Therapy (2 or more classes of medications)  Non-Invasive Test Results: No non-invasive testing performed  Prior CABG: Previous CABG        Benjamin [name] [facility] 10/06/2022 7:15 AM

## 2022-10-13 NOTE — Progress Notes (Signed)
[facility] Nurse Date of Birth: 1945/10/27 Medical Record #[id number]  History of Present Illness: Benjamin [name] is seen  for follow up s/p cardiac cath. He has a history of coronary disease with prior angioplasty of the proximal LAD in 1991. He also had angioplasty of the circumflex at that time. He has known occlusion of the right coronary with collateral flow. Last Myoview study in October 2015 showed a small anterolateral scar. No ischemia.    On a prior  visit he described 3 episodes of chest pain for which he took sl Ntg. Lasted 5-10 minutes. No clear triggers. One time he became diaphoretic.  He denies dyspnea or palpitation.  He had a Myoview study showing anterior ischemia. This led to a cardiac cath in February 2020 showing occluded RCA and moderate nonobstructive disease in the LAD and LCx. Unchanged from prior study. Medical therapy recommended.   He was on maximal antianginal therapy with  aspirin, atenolol, and isosorbide, Ranexa. He presented in March with  unstable angina. Cardiac cath showed progressive multivessel CAD. He subsequently underwent CABG by Dr Roxan Hockey with LIMA to the LAD, SVG to diagonal and SVG to OM. It was noted that the RCA branches were too small to graft. His post op course was fairly uneventful. He had Afib on post op day #3 but converted on amiodarone. He had post op anemia. When I saw him last we discontinued amiodarone due to Nausea and poor appetite. When seen by Dr Roxan Hockey on May 10 he was in Atrial flutter with rate 150. He was asymptomatic. Was seen in the Afib clinic and was back in NSR. Atenolol dose was increased. Event monitor was placed showing a single episode of SVT lasting 36 seconds.   He was seen on September 22 by Dr Percival Spanish for evaluation of exertional chest pain. He was started on Imdur and is seen back to evaluate response. He states that this helped about a week but then his pain returned. This is described as burning mid sternal pain that  radiates to his left jaw and arm. It is his typical angina. It is relieved with rest and sl Ntg. It is associated with exertion- just walking to his mailbox.   He underwent cardiac cath on 07/17/21. This showed patent LIMA to the LAD. The SVG to the first diagonal was occluded but the diagonal had good antegrade flow. The SVG to OM showed aorto-ostial stenosis as well as stenosis at the distal anastomosis. The ostial lesion was successfully stented and the distal lesion was treated with a cutting balloon with good result. His anginal symptoms improved post PCI.    He had recurrent angina over the past 6 months. We performed a cardiac cath showing a patent LIMA to the LAD and patent SVG to OM. There was no change otherwise. The first diagonal had good flow. The RCA is chronically occluded. No good targets for PCI and medical therapy continued.   On follow up today he still has occasional angina relieved with sl Ntg x 1. No change. Reports he is smoking less and drinking a lot less. Overall doing OK.     Allergies as of 10/23/2022       Reactions   Sulfa Drugs Cross Reactors Rash        Medication List        Accurate as of October 23, 2022 10:59 AM. If you have any questions, ask your nurse or doctor.  aspirin EC 81 MG tablet Take 1 tablet (81 mg total) by mouth daily.   atenolol 25 MG tablet Commonly known as: TENORMIN TAKE 1 TABLET BY MOUTH EVERY MORNING AND ONE-HALF TABLET IN THE EVENING   atorvastatin 80 MG tablet Commonly known as: LIPITOR Take 1 tablet (80 mg total) by mouth daily.   clopidogrel 75 MG tablet Commonly known as: PLAVIX TAKE 1 TABLET BY MOUTH EVERY DAY WITH BREAKFAST   docusate sodium 50 MG capsule Commonly known as: COLACE Take 50 mg by mouth at bedtime.   Farxiga 10 MG Tabs tablet Generic drug: dapagliflozin propanediol Take 10 mg by mouth in the morning.   glimepiride 4 MG tablet Commonly known as: AMARYL Take 4 mg by mouth daily with  breakfast.   isosorbide mononitrate 120 MG 24 hr tablet Commonly known as: IMDUR Take 1 tablet (120 mg total) by mouth daily.   Januvia 100 MG tablet Generic drug: sitaGLIPtin Take 100 mg by mouth in the morning.   JOCK ITCH EX Apply 1 Application topically daily.   metFORMIN 1000 MG tablet Commonly known as: GLUCOPHAGE Take 1 tablet (1,000 mg total) by mouth 2 (two) times daily.   nitroGLYCERIN 0.4 MG SL tablet Commonly known as: Nitrostat Place 1 tablet (0.4 mg total) under the tongue every 5 (five) minutes as needed for chest pain.   OneTouch Delica Plus [id number] Misc Apply 1 each topically daily.   ranolazine 1000 MG SR tablet Commonly known as: Ranexa Take 1 tablet (1,000 mg total) by mouth 2 (two) times daily.         Allergies  Allergen Reactions   Sulfa Drugs Cross Reactors Rash    Past Medical History:  Diagnosis Date   Asthma    as a child, no problems as an adult, no inhaler   Coronary artery disease    Prior PCI to the prox LAD in 1991; S/P PCI to LCX in October 1991   Diabetes mellitus    TYPE 2   GERD (gastroesophageal reflux disease)    occasional - diet controlled   History of kidney stones    many yrs ago   Hyperlipidemia    Tobacco abuse    quit 09/2020 - smoked for 64 yrs    Past Surgical History:  Procedure Laterality Date   COLONOSCOPY WITH PROPOFOL N/A 04/21/2016   Procedure: COLONOSCOPY WITH PROPOFOL;  Surgeon: Garlan Fair, MD;  Location: [facility];  Service: Endoscopy;  Laterality: N/A;   CORONARY ANGIOPLASTY  05/1990   PROXIMAL LAD   CORONARY ANGIOPLASTY  06/1990   LEFT CIRCUMFLEX CORONARY   CORONARY ARTERY BYPASS GRAFT N/A 12/26/2020   Procedure: CORONARY ARTERY BYPASS GRAFTING (CABG), ON PUMP, TIMES THREE, USING LEFT INTERNAL MAMMARY ARTERY AND ENDOSCOPICALLY HARVESTED RIGHT GREATER SAPHENOUS VEIN;  Surgeon: Melrose Nakayama, MD;  Location: [facility];  Service: Open Heart Surgery;  Laterality: N/A;   CORONARY STENT  INTERVENTION N/A 07/17/2021   Procedure: CORONARY STENT INTERVENTION;  Surgeon: Martinique, Kayline Sheer M, MD;  Location: [facility];  Service: Cardiovascular;  Laterality: N/A;   KIDNEY STONE SURGERY Bilateral    x 2 surgeries   LEFT HEART CATH AND CORONARY ANGIOGRAPHY N/A 11/18/2018   Procedure: LEFT HEART CATH AND CORONARY ANGIOGRAPHY;  Surgeon: Martinique, Romesha Scherer M, MD;  Location: [facility];  Service: Cardiovascular;  Laterality: N/A;   LEFT HEART CATH AND CORONARY ANGIOGRAPHY N/A 12/17/2020   Procedure: LEFT HEART CATH AND CORONARY ANGIOGRAPHY;  Surgeon: Martinique, Trinidi Toppins M,  MD;  Location: [facility];  Service: Cardiovascular;  Laterality: N/A;   LEFT HEART CATH AND CORS/GRAFTS ANGIOGRAPHY N/A 07/17/2021   Procedure: LEFT HEART CATH AND CORS/GRAFTS ANGIOGRAPHY;  Surgeon: Martinique, Ausencio Vaden M, MD;  Location: [facility];  Service: Cardiovascular;  Laterality: N/A;   LEFT HEART CATH AND CORS/GRAFTS ANGIOGRAPHY N/A 10/06/2022   Procedure: LEFT HEART CATH AND CORS/GRAFTS ANGIOGRAPHY;  Surgeon: Martinique, Field Staniszewski M, MD;  Location: [facility];  Service: Cardiovascular;  Laterality: N/A;   TEE WITHOUT CARDIOVERSION N/A 12/26/2020   Procedure: TRANSESOPHAGEAL ECHOCARDIOGRAM (TEE);  Surgeon: Melrose Nakayama, MD;  Location: [facility];  Service: Open Heart Surgery;  Laterality: N/A;   UMBILICAL HERNIA REPAIR      Social History   Tobacco Use  Smoking Status Former   Packs/day: 1.00   Years: 64.00   Total pack years: 64.00   Types: Cigarettes   Quit date: 09/28/2020   Years since quitting: 2.0  Smokeless Tobacco Never    Social History   Substance and Sexual Activity  Alcohol Use Yes   Alcohol/week: 3.0 - 4.0 standard drinks of alcohol   Types: 3 - 4 Standard drinks or equivalent per week   Comment: every other weekend - liquor    Family History  Problem Relation Age of Onset   Heart disease Sister        had open heart surgery    Review of Systems: As noted in history of  present illness.  All other systems were reviewed and are negative.  Physical Exam: BP (!) 104/44   Pulse (!) 55   Ht 5' 4.5" (1.638 m)   Wt 128 lb 12.8 oz (58.4 kg)   SpO2 92%   BMI 21.77 kg/m  GENERAL:  Well appearing WM in NAD HEENT:  PERRL, EOMI, sclera are clear. Oropharynx is clear. NECK:  No jugular venous distention, carotid upstroke brisk and symmetric, no bruits, no thyromegaly or adenopathy LUNGS:  Clear to auscultation bilaterally CHEST:  Incisions healing well.  HEART:  RRR,  PMI not displaced or sustained,S1 and S2 within normal limits, no S3, no S4: no clicks, no rubs, no murmurs ABD:  Soft, nontender. BS +, no masses or bruits. No hepatomegaly, no splenomegaly EXT:  2 + pulses throughout, no edema, no cyanosis no clubbing.  SKIN:  Warm and dry.  No rashes NEURO:  Alert and oriented x 3. Cranial nerves II through XII intact. PSYCH:  Cognitively intact   LABORATORY DATA:  Lab Results  Component Value Date   WBC 8.6 09/29/2022   HGB 12.4 (L) 09/29/2022   HCT 40.8 09/29/2022   PLT 235 09/29/2022   GLUCOSE 139 (H) 09/29/2022   CHOL 107 09/29/2022   TRIG 57 09/29/2022   HDL 49 09/29/2022   LDLCALC 45 09/29/2022   ALT 9 09/29/2022   AST 9 09/29/2022   NA 140 09/29/2022   K 4.8 09/29/2022   CL 104 09/29/2022   CREATININE 0.81 09/29/2022   BUN 13 09/29/2022   CO2 22 09/29/2022   TSH 2.030 06/19/2021   INR 1.0 09/29/2022   HGBA1C 7.2 (H) 09/29/2022   Labs dated 08/05/16: cholesterol 115, triglycerides 56, LDL 49, HDL 56. A1c 7.4%. CMET normal. Dated 08/30/17: cholesterol 120, triglycerides 58, HDL 46, LDL 62. A1c 7.8%. Chemistries and CBC normal.  Dated 09/16/18: cholesterol 106, triglycerides 51, HDL 48, LDL48. A1c 7.4%. CMET normal Dated 03/27/19: A1c 7.5%. BMET normal Dated 10/19/19: cholesterol 119, triglycerides 52, HDL 51,  LDL 56, CMET normal Dated 04/10/20: A1c 6.8%.  Dated 10/10/20: cholesterol 123, triglycerides 56, HDL 55, LDL 56. A1c 7.1%. CMET normal.   Dated 04/18/21: A1c 8.6% Dated 04/22/22: A1c 7.5%, CMET normal.    Ecg not done today     Myoview 11/09/18: Study Highlights     The left ventricular ejection fraction is normal (55-65%). Nuclear stress EF: 59%. Blood pressure demonstrated a normal response to exercise. There was no ST segment deviation noted during stress. No T wave inversion was noted during stress. Defect 1: There is a small defect of moderate severity present in the mid anterolateral, apical lateral and apex location. Findings consistent with ischemia. This is an intermediate risk study.   The anterolateral perfusion defect on the prior exam was interpreted as fixed, however on today's exam appears to have reversible ischemia. Wall motion appears abnormal in this region.     Cardiac cath 11/18/18:  LEFT HEART CATH AND CORONARY ANGIOGRAPHY  Conclusion     Prox LAD lesion is 45% stenosed. Prox LAD to Mid LAD lesion is 65% stenosed. Ost Cx to Prox Cx lesion is 50% stenosed. Prox Cx to Mid Cx lesion is 50% stenosed. Prox RCA to Dist RCA lesion is 100% stenosed. The left ventricular systolic function is normal. LV end diastolic pressure is normal. The left ventricular ejection fraction is 55-65% by visual estimate.   1. Single vessel occlusive CAD with CTO of the RCA. Left to right collaterals. There is diffuse moderate calcific disease involving the LAD and LCx that is not significantly changed since 2011. 2. Normal LV function 3. Normal LV EDP   Plan: continued medical therapy   Cardiac cath 12/17/20:  LEFT HEART CATH AND CORONARY ANGIOGRAPHY    Conclusion    Prox LAD lesion is 45% stenosed. Prox LAD to Mid LAD lesion is 75% stenosed. Ost Cx to Prox Cx lesion is 90% stenosed. Prox Cx to Mid Cx lesion is 60% stenosed. Prox RCA to Dist RCA lesion is 100% stenosed. Ost LM to Mid LM lesion is 30% stenosed. Ost LAD lesion is 80% stenosed. The left ventricular systolic function is normal. LV end  diastolic pressure is normal. The left ventricular ejection fraction is 55-65% by visual estimate.   1. Severe 3 vessel obstructive CAD. Patient has CTO of the RCA. Progressive ostial LAD and LCX disease. Severely calcified vessels 2. Normal LV function 3. Normal LVEDP   Plan: recommend referral to CT surgery for consideration of CABG.    Coronary Diagrams   Diagnostic Dominance: Right    Intervention   Intraop TEE 6/76/72: Complications: No known complications during this procedure.  POST-OP IMPRESSIONS  - Left Ventricle: The left ventricle is unchanged from pre-bypass.  - Right Ventricle: The right ventricle appears unchanged from pre-bypass.  - Aorta: The aorta appears unchanged from pre-bypass.  - Left Atrium: The left atrium appears unchanged from pre-bypass.  - Left Atrial Appendage: The left atrial appendage appears unchanged from  pre-bypass.  - Aortic Valve: The aortic valve appears unchanged from pre-bypass.  - Mitral Valve: The mitral valve appears unchanged from pre-bypass.  - Tricuspid Valve: The tricuspid valve appears unchanged from pre-bypass.  - Interatrial Septum: The interatrial septum appears unchanged from  pre-bypass.  - Interventricular Septum: The interventricular septum appears unchanged  from  pre-bypass.  - Pericardium: The pericardium appears unchanged from pre-bypass.   PRE-OP FINDINGS   Left Ventricle: The left ventricle has normal systolic function, with an  ejection fraction of 55-60%.  The cavity size was normal. There is no left  ventricular hypertrophy.    Right Ventricle: The right ventricle has normal systolic function. The  cavity was normal. There is no increase in right ventricular wall  thickness.   Left Atrium: Left atrial size was normal in size. No left atrial/left  atrial appendage thrombus was detected.   Right Atrium: Right atrial size was normal in size.   Interatrial Septum: No atrial level shunt detected by color  flow Doppler.   Pericardium: There is no evidence of pericardial effusion.   Mitral Valve: The mitral valve is normal in structure. Mitral valve  regurgitation is not visualized by color flow Doppler. There is No  evidence of mitral stenosis.   Tricuspid Valve: The tricuspid valve was normal in structure. Tricuspid  valve regurgitation was not visualized by color flow Doppler.   Aortic Valve: The aortic valve is tricuspid Aortic valve regurgitation was  not visualized by color flow Doppler. There is no stenosis of the aortic  valve.    Pulmonic Valve: The pulmonic valve was not assessed.  Pulmonic valve regurgitation was not assessed by color flow Doppler.       Renold Don MD   Event monitor 03/04/21: Study Highlights    Patch Wear Time:  14 days and 0 hours (2022-05-18T15:15:55-0400 to 2022-06-01T15:15:59-0400)   Patient had a min HR of 49 bpm, max HR of 167 bpm, and avg HR of 67 bpm. Predominant underlying rhythm was Sinus Rhythm. 2 Ventricular Tachycardia runs occurred, the run with the fastest interval lasting 4 beats with a max rate of 156 bpm, the longest lasting 4 beats with an avg rate of 147 bpm. 50 Supraventricular Tachycardia runs occurred, the run with the fastest interval lasting 36.3 secs with a max rate of 167 bpm (avg 150 bpm); the run with the fastest interval was also the longest. Isolated SVEs were frequent (5.5%, 88416), SVE Couplets were rare (<1.0%, 3497), and SVE Triplets were rare (<1.0%, 910). Isolated VEs were rare (<1.0%, 1272), VE Triplets were rare (<1.0%, 3), and no VE Couplets were present. Ventricular Trigeminy was present.  Cardiac cath 07/17/21:  CORONARY STENT INTERVENTION  LEFT HEART CATH AND CORS/GRAFTS ANGIOGRAPHY   Conclusion      Ost LM to Mid LM lesion is 30% stenosed.   Prox LAD lesion is 45% stenosed.   Prox LAD to Mid LAD lesion is 75% stenosed.   Ost Cx to Prox Cx lesion is 90% stenosed.   Prox Cx to Mid Cx lesion is 60%  stenosed.   Prox RCA to Dist RCA lesion is 100% stenosed.   Ost LAD lesion is 70% stenosed.   Origin to Prox Graft lesion is 100% stenosed.   2nd Mrg lesion is 80% stenosed.   Scoring balloon angioplasty was performed using a [place].   Post intervention, there is a 0% residual stenosis.   Origin lesion is 70% stenosed.   A drug-eluting stent was successfully placed using a SYNERGY XD 4.0X12.   Post intervention, there is a 0% residual stenosis.   LIMA graft was visualized by angiography and is normal in caliber.   SVG graft was visualized by angiography.   The graft exhibits no disease.   The left ventricular systolic function is normal.   LV end diastolic pressure is normal.   The left ventricular ejection fraction is 55-65% by visual estimate.   3 vessel obstructive CAD Patent LIMA to the LAD Occluded SVG to the first diagonal.  The native diagonal has good flow SVG to the OM2 is patent but has stenosis at the aorto-ostium and at the distal anastomosis. CTO of the RCA Good LV function Normal LVEDP Successful PCI of the SVG to OM2 with DES at the ostial SVG and scoring balloon angioplasty of the distal anastomosis.   Plan: DAPT with ASA and Plavix for one year. Do not resume Eliquis. Anticipate same day DC. Coronary Diagrams  Diagnostic Dominance: Right Intervention  Implants    Cardiac cath 10/06/22:  LEFT HEART CATH AND CORS/GRAFTS ANGIOGRAPHY   Conclusion      Ost LM to Mid LM lesion is 30% stenosed.   Prox LAD lesion is 45% stenosed.   Ost LAD lesion is 70% stenosed.   Prox LAD to Mid LAD lesion is 75% stenosed.   Prox RCA to Dist RCA lesion is 100% stenosed.   Origin to Prox Graft lesion is 100% stenosed.   Ost Cx to Prox Cx lesion is 95% stenosed.   Prox Cx to Mid Cx lesion is 60% stenosed.   Non-stenotic Origin lesion was previously treated.   Non-stenotic 2nd Mrg lesion was previously treated.   LIMA and is normal in caliber.   SVG.   The  graft exhibits no disease.   The left ventricular systolic function is normal.   LV end diastolic pressure is normal.   The left ventricular ejection fraction is 55-65% by visual estimate.   Severe 3 vessel obstructive CAD Patent LIMA to the LAD Occluded SVG to the diagonal. Patient has good antegrade flow in the diagonal Patent SVG to OM including patent stent ostially and patent distal anastomosis.  Normal LV function Normal LVEDP   Plan: continue medical therapy. I do not see any good targets for PCI. The RCA is chronically occluded. It is small and severely calcified. It is not suitable for CTO PCI. The ostial and mid LCx is severe diseased but the OM has a widely patent graft so only the small distal LCx is compromised. Stenting the native LCx would require extensive atherectomy and stenting and would potentially compromise flow into the LAD and thus the first diagonal.    Assessment / Plan: 1. Coronary disease with remote angioplasty of the LAD and left circumflex coronary  in 1991. Chronic total occlusion of the right coronary. Repeat evaluation with cardiac cath in February 2020 was stable.   He presented in March with  unstable angina. Cardiac cath showed progressive multivessel CAD. He subsequently underwent CABG by Dr Roxan Hockey with LIMA to the LAD, SVG to diagonal and SVG to OM. It was noted that the RCA branches were too small to graft. Repeat cardiac cath showed occlusion of SVG to diagonal. There was significant stenosis at the ostium of SVG to OM as well as stenosis at the distal anastomosis. Both successfully treated with PCI in October 2022.  LIMA to LAD patent. On DAPT with ASA and Plavix. He has recurrent angina despite optimal medical therapy. Repeat cath with no good PCI targets. OM graft is still patent. Symptoms are stable today. Continue Rx.   2. Hyperlipidemia. Continue on high-dose atorvastatin. LDL at goal 45  3. Diabetes mellitus type 2.  on metformin, Januvia,  Jardiance and glimiperide. Needs to eliminate sugar/candy intake. Per primary care.   4. Tobacco abuse. Encourage complete smoking cessation.  5. Post op Afib. Converted on amiodarone. Intolerant of amiodarone due to GI side effects. Had recurrent Aflutter that converted spontaneously. Asymptomatic. No recurrence on event monitor.

## 2022-10-14 DIAGNOSIS — D0422 Carcinoma in situ of skin of left ear and external auricular canal: Secondary | ICD-10-CM | POA: Diagnosis not present

## 2022-10-14 DIAGNOSIS — D044 Carcinoma in situ of skin of scalp and neck: Secondary | ICD-10-CM | POA: Diagnosis not present

## 2022-10-23 ENCOUNTER — Ambulatory Visit: Payer: Medicare Other | Attending: Cardiology | Admitting: Cardiology

## 2022-10-23 ENCOUNTER — Encounter: Payer: Self-pay | Admitting: Cardiology

## 2022-10-23 VITALS — BP 104/44 | HR 55 | Ht 64.5 in | Wt 128.8 lb

## 2022-10-23 DIAGNOSIS — Z720 Tobacco use: Secondary | ICD-10-CM

## 2022-10-23 DIAGNOSIS — E7800 Pure hypercholesterolemia, unspecified: Secondary | ICD-10-CM

## 2022-10-23 DIAGNOSIS — I25708 Atherosclerosis of coronary artery bypass graft(s), unspecified, with other forms of angina pectoris: Secondary | ICD-10-CM | POA: Diagnosis not present

## 2022-10-23 DIAGNOSIS — Z951 Presence of aortocoronary bypass graft: Secondary | ICD-10-CM

## 2022-10-23 DIAGNOSIS — E785 Hyperlipidemia, unspecified: Secondary | ICD-10-CM | POA: Diagnosis not present

## 2022-10-23 NOTE — Patient Instructions (Addendum)
Medication Instructions:   Your physician recommends that you continue on your current medications as directed. Please refer to the Current Medication list given to you today.  *If you need a refill on your cardiac medications before your next appointment, please call your pharmacy*  Lab Work: NONE ordered at this time of appointment   If you have labs (blood work) drawn today and your tests are completely normal, you will receive your results only by: [facility] (if you have MyChart) OR A paper copy in the mail If you have any lab test that is abnormal or we need to change your treatment, we will call you to review the results.  Testing/Procedures: NONE ordered at this time of appointment   Follow-Up: At [facility], you and your health needs are our priority.  As part of our continuing mission to provide you with exceptional heart care, we have created designated Provider Care Teams.  These Care Teams include your primary Cardiologist (physician) and Advanced Practice Providers (APPs -  Physician Assistants and Nurse Practitioners) who all work together to provide you with the care you need, when you need it.  Your next appointment:   6 month(s)  Provider:   Peter Martinique, MD     Other Instructions

## 2022-10-29 DIAGNOSIS — I2510 Atherosclerotic heart disease of native coronary artery without angina pectoris: Secondary | ICD-10-CM | POA: Diagnosis not present

## 2022-10-29 DIAGNOSIS — E113293 Type 2 diabetes mellitus with mild nonproliferative diabetic retinopathy without macular edema, bilateral: Secondary | ICD-10-CM | POA: Diagnosis not present

## 2022-10-29 DIAGNOSIS — I10 Essential (primary) hypertension: Secondary | ICD-10-CM | POA: Diagnosis not present

## 2022-10-29 DIAGNOSIS — E7800 Pure hypercholesterolemia, unspecified: Secondary | ICD-10-CM | POA: Diagnosis not present

## 2022-11-05 ENCOUNTER — Encounter (HOSPITAL_COMMUNITY): Payer: Self-pay | Admitting: *Deleted

## 2022-12-11 ENCOUNTER — Ambulatory Visit: Payer: Medicare Other | Admitting: Podiatry

## 2022-12-11 ENCOUNTER — Encounter: Payer: Self-pay | Admitting: Podiatry

## 2022-12-11 DIAGNOSIS — L600 Ingrowing nail: Secondary | ICD-10-CM

## 2022-12-11 DIAGNOSIS — B351 Tinea unguium: Secondary | ICD-10-CM

## 2022-12-11 NOTE — Progress Notes (Signed)
Subjective:   Patient ID: Benjamin [name], male   DOB: 77 y.o.   MRN: [id number]   HPI Patient presents chronic ingrown toenail left big toe lateral border painful when pressed been going on for several years and has tried to trim it and soak it   Review of Systems  All other systems reviewed and are negative.       Objective:  Physical Exam Vitals and nursing note reviewed.  Constitutional:      Appearance: He is well-developed.  Pulmonary:     Effort: Pulmonary effort is normal.  Musculoskeletal:        General: Normal range of motion.  Skin:    General: Skin is warm.  Neurological:     Mental Status: He is alert.     Neurovascular status intact muscle strength adequate range of motion was noted to be adequate with incurvated lateral border left hallux with patient who also has concerns about overall nail structure and whether nail complete removal will be necessary.  Good digital perfusion well-oriented x 3     Assessment:  Chronic ingrown toenail deformity left hallux lateral border with also damage to the entire nailbed and several other nails which have some pathology     Plan:  Reviewed all conditions discussed this I do not recommend full nail removal as I would be concerned about healing number just get a focus on the corner that is been so sore.  I went ahead today explained procedure allowed him to read consent form for correction infiltrated the left hallux 60 mg like Marcaine mixture remove the lateral border exposed matrix applied phenol 3 applications 30 seconds followed by alcohol lavage sterile dressing gave instructions on soaks and to wear dressing 24-hour

## 2022-12-11 NOTE — Patient Instructions (Signed)

## 2023-01-01 ENCOUNTER — Telehealth: Payer: Self-pay | Admitting: Cardiology

## 2023-01-01 MED ORDER — ISOSORBIDE MONONITRATE ER 120 MG PO TB24: 120.0000 mg | ORAL_TABLET | Freq: Every day | ORAL | 3 refills | Status: DC

## 2023-01-01 MED ORDER — ATORVASTATIN CALCIUM 80 MG PO TABS: 80.0000 mg | ORAL_TABLET | Freq: Every day | ORAL | 3 refills | Status: DC

## 2023-01-01 NOTE — Telephone Encounter (Signed)
Pt's medications were sent to pt's pharmacy as requested. Confirmation received.  

## 2023-01-01 NOTE — Telephone Encounter (Signed)
*  STAT* If patient is at the pharmacy, call can be transferred to refill team.   1. Which medications need to be refilled? (please list name of each medication and dose if known)   atorvastatin (LIPITOR) 80 MG tablet    isosorbide mononitrate (IMDUR) 120 MG 24 hr tablet   2. Which pharmacy/location (including street and city if local pharmacy) is medication to be sent to? [id number] - [place], [place] - [address] AT [facility] OF [address]   3. Do they need a 30 day or 90 day supply? 90 day

## 2023-01-13 DIAGNOSIS — L821 Other seborrheic keratosis: Secondary | ICD-10-CM | POA: Diagnosis not present

## 2023-01-13 DIAGNOSIS — L570 Actinic keratosis: Secondary | ICD-10-CM | POA: Diagnosis not present

## 2023-01-13 DIAGNOSIS — Z85828 Personal history of other malignant neoplasm of skin: Secondary | ICD-10-CM | POA: Diagnosis not present

## 2023-03-19 ENCOUNTER — Other Ambulatory Visit: Payer: Self-pay | Admitting: Cardiology

## 2023-04-02 DIAGNOSIS — H524 Presbyopia: Secondary | ICD-10-CM | POA: Diagnosis not present

## 2023-04-02 DIAGNOSIS — E11319 Type 2 diabetes mellitus with unspecified diabetic retinopathy without macular edema: Secondary | ICD-10-CM | POA: Diagnosis not present

## 2023-04-06 ENCOUNTER — Other Ambulatory Visit: Payer: Self-pay

## 2023-04-06 NOTE — Telephone Encounter (Signed)
Pt's pharmacy is requesting a refill on glimepiride 2 mg tablets. This is not a cardiac medication. Would Dr [place] like to refill this medication?

## 2023-04-21 ENCOUNTER — Other Ambulatory Visit: Payer: Self-pay | Admitting: Cardiology

## 2023-05-01 NOTE — Progress Notes (Unsigned)
Cardiology Office Note:    Date:  05/03/2023   ID:  Benjamin [name], DOB May 24, 1946, MRN [id number]  PCP:  Asencion Gowda.[name], MD  Cardiologist:  Peter Swaziland, MD  Electrophysiologist:  None   Referring MD: Asencion Gowda.[name], MD   Chief Complaint: follow-up of CAD  History of Present Illness:    Benjamin [name] is a 77 y.o. male with a history of CAD s/p multiple PCIs (remote angioplasty of proximal LAD and LCX in 1991, CABG x3 with LIMA-LAD, SVG-Diag, SVG-OM in 11/2020, and DES to ostial SVG-OM2 and POBA of distal anastomosis in 06/2021),  post-op atrial fibrillation/ flutter with no recurrence on monitor in 01/2021, hyperlipidemia, type 2 diabetes mellitus, and prior tobacco abuse (quit in 2022) who is followed by Dr. Allyson Sabal and presents today for routine follow-up.  Patient has a long history of CAD with remote PCI with angioplasty of LAD and LCX in 1991. She was found to have severe multivessel CAD on cardiac catheterization in 11/2020 and ultimately underwent CABG x3 with LIMA to LAD, SVG to 1st Diag, and SVG to OM2 later that month. She was noted to have some post-op atrial fibrillation and flutter and was initially on Amiodarone and Eliquis. He was unable to tolerate Amiodarone due to GI side effects. However, outpatient monitor in 01/2021 showed no recurrent atrial fibrillation/ flutter. Eliquis was subsequently discontinued. She had repeat cardiac catheterization in 06/2021 for recurrent chest pain which showed patent LIMA to LAD with occluded SVG to 1st Diag (with good native Diag flow) and stenosis of the aorto-ostium and distal anastomosis of the SVG to OM2 graft. She underwent successful PCI with DES to ostial SVG to OM2 and balloon angioplasty to the distal anastomosis. He had another cardiac catheterization in 09/2022 due to recurrent chest pain which showed patent LIMA to LAD and SVG to OM2 and known occluded SVG to Diag and CTO of RCA (not felt to be a candidate for CTO PCI). There was not  felt to be any good targets for PCI and continued medical therapy was recommended. She was last seen by Dr. Swaziland later that month at which time he continued to report occasional angina relieved by one dose of sublingual Nitro but was overall stable.   Patient presents today for follow-up. He continues to have exertional chest pain / left arm pain (now more in his left arm than in his chest) when walking but states it is occurring less often. He still has to take one dose of sublingual Nitro 1-2 times every couple of weeks but again states this is an improvement. Overall, angina stable (maybe slightly better). No shortness of breath, orthopnea, PND, edema, palpitations, lightheadedness, dizziness, or syncope. He think he slept on his neck wrong and reports pain along his upper right neck that radiates up his heads. He pain with palpation of this area suggesting musculoskeletal pain. Pain much better today compared to yesterday. Recommended Tylenol as needed. He is still smoking but states he is smoking less. He has smoked for 66 years. He currently smokes 1/2 pack per day (down from 1 pack per day).  EKGs/Labs/Other Studies Reviewed:    The following studies were reviewed:  Left Cardiac Catheterization 10/06/2022:   Ost LM to Mid LM lesion is 30% stenosed.   Prox LAD lesion is 45% stenosed.   Ost LAD lesion is 70% stenosed.   Prox LAD to Mid LAD lesion is 75% stenosed.   Prox RCA to Dist RCA lesion is 100% stenosed.  Origin to Prox Graft lesion is 100% stenosed.   Ost Cx to Prox Cx lesion is 95% stenosed.   Prox Cx to Mid Cx lesion is 60% stenosed.   Non-stenotic Origin lesion was previously treated.   Non-stenotic 2nd Mrg lesion was previously treated.   LIMA and is normal in caliber.   SVG.   The graft exhibits no disease.   The left ventricular systolic function is normal.   LV end diastolic pressure is normal.   The left ventricular ejection fraction is 55-65% by visual estimate.    Severe 3 vessel obstructive CAD Patent LIMA to the LAD Occluded SVG to the diagonal. Patient has good antegrade flow in the diagonal Patent SVG to OM including patent stent ostially and patent distal anastomosis.  Normal LV function Normal LVEDP   Plan: continue medical therapy. I do not see any good targets for PCI. The RCA is chronically occluded. It is small and severely calcified. It is not suitable for CTO PCI. The ostial and mid LCx is severe diseased but the OM has a widely patent graft so only the small distal LCx is compromised. Stenting the native LCx would require extensive atherectomy and stenting and would potentially compromise flow into the LAD and thus the first diagonal.   Diagnostic Dominance: Right      EKG:  EKG not ordered today.   Recent Labs: 09/29/2022: ALT 9; BUN 13; Creatinine, Ser 0.81; Hemoglobin 12.4; Platelets 235; Potassium 4.8; Sodium 140  Recent Lipid Panel    Component Value Date/Time   CHOL 107 09/29/2022 0849   TRIG 57 09/29/2022 0849   HDL 49 09/29/2022 0849   CHOLHDL 2.2 09/29/2022 0849   LDLCALC 45 09/29/2022 0849    Physical Exam:    Vital Signs: BP 120/60 (BP Location: Left Arm, Patient Position: Sitting, Cuff Size: Normal)   Pulse (!) 59   Ht 5\' 5"  (1.651 m)   Wt 133 lb 12.8 oz (60.7 kg)   SpO2 95%   BMI 22.27 kg/m     Wt Readings from Last 3 Encounters:  05/03/23 133 lb 12.8 oz (60.7 kg)  10/23/22 128 lb 12.8 oz (58.4 kg)  10/06/22 135 lb (61.2 kg)     General: 77 y.o. Caucasian male in no acute distress. HEENT: Normocephalic and atraumatic. Sclera clear.  Neck: Supple. No carotid bruits. No JVD. Heart: RRR. Distinct S1 and S2. No murmurs, gallops, or rubs. Radial  pulses 2+ and equal bilaterally. Lungs: No increased work of breathing. Clear to ausculation bilaterally. No wheezes, rhonchi, or rales.  Abdomen: Soft, non-distended, and non-tender to palpation.  Extremities: No lower extremity edema.    Skin: Warm and  dry. Neuro: Alert and oriented x3. No focal deficits. Psych: Normal affect. Responds appropriately.   Assessment:    1. Coronary artery disease of native artery of native heart with stable angina pectoris (HCC)   2. S/P CABG (coronary artery bypass graft)   3. Post-op atrial fibrillation/ flutter   4. Hyperlipidemia, unspecified hyperlipidemia type   5. Type 2 diabetes mellitus with complication, without long-term current use of insulin (HCC)     Plan:    CAD s/p CABG Chronic Stable Angina Patient has a long history of CAD with remote PCI in 1991 and then CABG x3 with LIMA to LAD, SVG to 1st Diag, and SVG to OM2 in 11/2020. LHC in 06/2021 showed patent LIMA to LAD with occluded SVG to 1st Diag (with good native Diag flow) and stenosis of the  aorto-ostium and distal anastomosis of the SVG to OM2 graft. She underwent successful PCI with DES to ostial SVG to OM2 and balloon angioplasty to the distal anastomosis. Last LHC in 09/2022 showed patent LIMA to LAD and SVG to OM2 and known occluded SVG to Diag and CTO of RCA (not felt to be a candidate for CTO PCI). There was not felt to be any good targets for PCI and continued medical therapy was recommended. - Continue to have stable angina. No resting pain. Symptoms occurring less frequently compared to last visit. - Continue antianginals: Atenolol 25mg  in the AM and 12.5mg  in the PM, Imdur 120mg  daily, and Ranexa 1,000mg  twice daily.  - Continue DAPT with Asprin and Plavix.  - Continue high-intensity statin.  - Advised patient to let us know if chest pain worsen or becomes more frequent or if he is requiring more sublingual Nitro.  Post-Op Atrial Fibrillation/ Flutter Following CABG in 11/2020. No recurrence noted on monitor in 01/2021. No longer on anticoagulation.  - Maintaining sinus rhythm on exam.  - Continue beta-blocker as above. - CHA2DS2-VASc = 4 (CAD, DM, age x2). If he has any documented recurrence, will likely need to restart  Eliquis.  Hyperlipidemia Lipid panel in 09/2022: Total Cholesterol 107, Triglycerides 57, HDL 49, LDL 45. LDL goal <55.  - Continue Lipitor 80mg  daily.   Type 2 Diabetes Mellitus Hemoglobin A1c 7.2 in 09/2022.  - On Farxiga, Januvia, Glimepiride, and Metformin.  - Management per PCP.  Tobacco Abuse Patient has a 66 year smoking history. He continues to smoke but is smoking less. Previously smoking 1 pack per day and now only smoking 1/2 pack per day.  - Congratulated patient on progress made so far and encouraged him to work towards complete cessation.  Disposition: Follow up in 6 months.   Medication Adjustments/Labs and Tests Ordered: Current medicines are reviewed at length with the patient today.  Concerns regarding medicines are outlined above.  No orders of the defined types were placed in this encounter.  No orders of the defined types were placed in this encounter.   Patient Instructions  Medication Instructions:  No medication changes *If you need a refill on your cardiac medications before your next appointment, please call your pharmacy*   Lab Work: none If you have labs (blood work) drawn today and your tests are completely normal, you will receive your results only by: MyChart Message (if you have MyChart) OR A paper copy in the mail If you have any lab test that is abnormal or we need to change your treatment, we will call you to review the results.   Testing/Procedures: none   Follow-Up: At [facility], you and your health needs are our priority.  As part of our continuing mission to provide you with exceptional heart care, we have created designated Provider Care Teams.  These Care Teams include your primary Cardiologist (physician) and Advanced Practice Providers (APPs -  Physician Assistants and Nurse Practitioners) who all work together to provide you with the care you need, when you need it.  We recommend signing up for the patient portal  called "MyChart".  Sign up information is provided on this After Visit Summary.  MyChart is used to connect with patients for Virtual Visits (Telemedicine).  Patients are able to view lab/test results, encounter notes, upcoming appointments, etc.  Non-urgent messages can be sent to your provider as well.   To learn more about what you can do with MyChart, go to [email or web address].  Your next appointment:   6 month(s)  Provider:   Peter Swaziland, MD or Marjie Skiff, PA    Other Instructions  A LETTER WILL BE SENT TO YOU AS A REMINDER TO CALL THE OFFICE TO SCHEDULE YOUR 6 MONTH FOLLOW UP ,   Signed, Corrin Parker, PA-C  05/03/2023 4:20 PM    [facility]

## 2023-05-03 ENCOUNTER — Encounter: Payer: Self-pay | Admitting: Student

## 2023-05-03 ENCOUNTER — Ambulatory Visit: Payer: Medicare Other | Attending: Student | Admitting: Student

## 2023-05-03 VITALS — BP 120/60 | HR 59 | Ht 65.0 in | Wt 133.8 lb

## 2023-05-03 DIAGNOSIS — E118 Type 2 diabetes mellitus with unspecified complications: Secondary | ICD-10-CM | POA: Diagnosis not present

## 2023-05-03 DIAGNOSIS — Z951 Presence of aortocoronary bypass graft: Secondary | ICD-10-CM

## 2023-05-03 DIAGNOSIS — I25118 Atherosclerotic heart disease of native coronary artery with other forms of angina pectoris: Secondary | ICD-10-CM

## 2023-05-03 DIAGNOSIS — I480 Paroxysmal atrial fibrillation: Secondary | ICD-10-CM | POA: Diagnosis not present

## 2023-05-03 DIAGNOSIS — E785 Hyperlipidemia, unspecified: Secondary | ICD-10-CM | POA: Diagnosis not present

## 2023-05-03 DIAGNOSIS — Z720 Tobacco use: Secondary | ICD-10-CM | POA: Diagnosis not present

## 2023-05-03 NOTE — Patient Instructions (Signed)
Medication Instructions:  No medication changes *If you need a refill on your cardiac medications before your next appointment, please call your pharmacy*   Lab Work: none If you have labs (blood work) drawn today and your tests are completely normal, you will receive your results only by: MyChart Message (if you have MyChart) OR A paper copy in the mail If you have any lab test that is abnormal or we need to change your treatment, we will call you to review the results.   Testing/Procedures: none   Follow-Up: At [facility], you and your health needs are our priority.  As part of our continuing mission to provide you with exceptional heart care, we have created designated Provider Care Teams.  These Care Teams include your primary Cardiologist (physician) and Advanced Practice Providers (APPs -  Physician Assistants and Nurse Practitioners) who all work together to provide you with the care you need, when you need it.  We recommend signing up for the patient portal called "MyChart".  Sign up information is provided on this After Visit Summary.  MyChart is used to connect with patients for Virtual Visits (Telemedicine).  Patients are able to view lab/test results, encounter notes, upcoming appointments, etc.  Non-urgent messages can be sent to your provider as well.   To learn more about what you can do with MyChart, go to [email or web address].    Your next appointment:   6 month(s)  Provider:   Peter Swaziland, MD or Marjie Skiff, PA    Other Instructions  A LETTER WILL BE SENT TO YOU AS A REMINDER TO CALL THE OFFICE TO SCHEDULE YOUR 6 MONTH FOLLOW UP ,

## 2023-05-04 DIAGNOSIS — E7800 Pure hypercholesterolemia, unspecified: Secondary | ICD-10-CM | POA: Diagnosis not present

## 2023-05-04 DIAGNOSIS — Z0000 Encounter for general adult medical examination without abnormal findings: Secondary | ICD-10-CM | POA: Diagnosis not present

## 2023-05-04 DIAGNOSIS — I2510 Atherosclerotic heart disease of native coronary artery without angina pectoris: Secondary | ICD-10-CM | POA: Diagnosis not present

## 2023-05-04 DIAGNOSIS — I10 Essential (primary) hypertension: Secondary | ICD-10-CM | POA: Diagnosis not present

## 2023-05-04 DIAGNOSIS — M542 Cervicalgia: Secondary | ICD-10-CM | POA: Diagnosis not present

## 2023-05-04 DIAGNOSIS — E113293 Type 2 diabetes mellitus with mild nonproliferative diabetic retinopathy without macular edema, bilateral: Secondary | ICD-10-CM | POA: Diagnosis not present

## 2023-06-26 ENCOUNTER — Other Ambulatory Visit: Payer: Self-pay | Admitting: Cardiology

## 2023-07-15 DIAGNOSIS — D044 Carcinoma in situ of skin of scalp and neck: Secondary | ICD-10-CM | POA: Diagnosis not present

## 2023-07-15 DIAGNOSIS — L570 Actinic keratosis: Secondary | ICD-10-CM | POA: Diagnosis not present

## 2023-07-28 ENCOUNTER — Other Ambulatory Visit: Payer: Self-pay | Admitting: Cardiology

## 2023-09-16 ENCOUNTER — Other Ambulatory Visit: Payer: Self-pay | Admitting: Cardiology

## 2023-09-23 ENCOUNTER — Other Ambulatory Visit: Payer: Self-pay | Admitting: Cardiology

## 2023-12-09 ENCOUNTER — Telehealth: Payer: Self-pay | Admitting: Cardiology

## 2023-12-09 MED ORDER — RANOLAZINE ER 1000 MG PO TB12: 1000.0000 mg | ORAL_TABLET | Freq: Two times a day (BID) | ORAL | 0 refills | Status: DC

## 2023-12-09 NOTE — Telephone Encounter (Signed)
*  STAT* If patient is at the pharmacy, call can be transferred to refill team.   1. Which medications need to be refilled? (please list name of each medication and dose if known)  new prescription for Ranolazine   2. Would you like to learn more about the convenience, safety, & potential cost savings by using the [facility]?     3. Are you open to using the [facility] (Type [facility].    4. Which pharmacy/location (including street and city if local pharmacy) is medication to be sent to?[facility]   5. Do they need a 30 day or 90 day supply? 90 days and refills

## 2023-12-09 NOTE — Telephone Encounter (Signed)
 Pt's medication was sent to pt's pharmacy as requested. Confirmation received.

## 2023-12-15 ENCOUNTER — Other Ambulatory Visit: Payer: Self-pay | Admitting: Cardiology

## 2023-12-23 ENCOUNTER — Other Ambulatory Visit: Payer: Self-pay | Admitting: Cardiology

## 2024-02-05 NOTE — Progress Notes (Unsigned)
 Benjamin [name] Date of Birth: January 11, 1946 Medical Record #[id number]  History of Present Illness: Benjamin [name] is seen  for follow up s/p cardiac cath. He has a history of coronary disease with prior angioplasty of the proximal LAD in 1991. He also had angioplasty of the circumflex at that time. He has known occlusion of the right coronary with collateral flow. Last Myoview  study in October 2015 showed a small anterolateral scar. No ischemia.    On a prior  visit he described 3 episodes of chest pain for which he took sl Ntg. Lasted 5-10 minutes. No clear triggers. One time he became diaphoretic.  He denies dyspnea or palpitation.  He had a Myoview  study showing anterior ischemia. This led to a cardiac cath in February 2020 showing occluded RCA and moderate nonobstructive disease in the LAD and LCx. Unchanged from prior study. Medical therapy recommended.   He was on maximal antianginal therapy with  aspirin , atenolol , and isosorbide , Ranexa . He presented in March with  unstable angina. Cardiac cath showed progressive multivessel CAD. He subsequently underwent CABG by Dr Luna Salinas with LIMA to the LAD, SVG to diagonal and SVG to OM. It was noted that the RCA branches were too small to graft. His post op course was fairly uneventful. He had Afib on post op day #3 but converted on amiodarone . He had post op anemia. When I saw him last we discontinued amiodarone  due to Nausea and poor appetite. When seen by Dr Luna Salinas on May 10 he was in Atrial flutter with rate 150. He was asymptomatic. Was seen in the Afib clinic and was back in NSR. Atenolol  dose was increased. Event monitor was placed showing a single episode of SVT lasting 36 seconds.   He was seen on September 22 by Dr Lavonne Prairie for evaluation of exertional chest pain. He was started on Imdur  and is seen back to evaluate response. He states that this helped about a week but then his pain returned. This is described as burning mid sternal pain that  radiates to his left jaw and arm. It is his typical angina. It is relieved with rest and sl Ntg. It is associated with exertion- just walking to his mailbox.   He underwent cardiac cath on 07/17/21. This showed patent LIMA to the LAD. The SVG to the first diagonal was occluded but the diagonal had good antegrade flow. The SVG to OM showed aorto-ostial stenosis as well as stenosis at the distal anastomosis. The ostial lesion was successfully stented and the distal lesion was treated with a cutting balloon with good result. His anginal symptoms improved post PCI.    He had recurrent angina over the past 6 months. We performed a cardiac cath showing a patent LIMA to the LAD and patent SVG to OM. There was no change otherwise. The first diagonal had good flow. The RCA is chronically occluded. No good targets for PCI and medical therapy continued.   On follow up today he still has occasional angina relieved with sl Ntg x 1. No change. Reports he is smoking less and drinking a lot less. Overall doing OK.     Allergies as of 02/08/2024       Reactions   Sulfa Drugs Cross Reactors Rash        Medication List        Accurate as of Feb 05, 2024  9:48 AM. If you have any questions, ask your nurse or doctor.  aspirin  EC 81 MG tablet Take 1 tablet (81 mg total) by mouth daily.   atenolol  25 MG tablet Commonly known as: TENORMIN  TAKE 1 TABLET BY MOUTH EVERY MORNING AND 1/2 TABLET IN THE EVENING   atorvastatin  80 MG tablet Commonly known as: LIPITOR  Take 1 tablet (80 mg total) by mouth daily.   clopidogrel  75 MG tablet Commonly known as: PLAVIX  TAKE 1 TABLET BY MOUTH EVERY DAY WITH BREAKFAST   docusate sodium  50 MG capsule Commonly known as: COLACE Take 50 mg by mouth at bedtime.   DULCOLAX PO Take 50 mg by mouth daily.   glimepiride  4 MG tablet Commonly known as: AMARYL  Take 4 mg by mouth daily with breakfast.   isosorbide  mononitrate 120 MG 24 hr tablet Commonly known  as: IMDUR  TAKE 1 TABLET(120 MG) BY MOUTH DAILY   Januvia 100 MG tablet Generic drug: sitaGLIPtin Take 100 mg by mouth in the morning.   Jardiance 10 MG Tabs tablet Generic drug: empagliflozin Take 10 mg by mouth daily.   JOCK ITCH EX Apply 1 Application topically daily.   metFORMIN  1000 MG tablet Commonly known as: GLUCOPHAGE  Take 1 tablet (1,000 mg total) by mouth 2 (two) times daily.   nitroGLYCERIN  0.4 MG SL tablet Commonly known as: Nitrostat  Place 1 tablet (0.4 mg total) under the tongue every 5 (five) minutes as needed for chest pain.   OneTouch Delica Plus Lancet33G Misc Apply 1 each topically daily.   ranolazine  1000 MG SR tablet Commonly known as: Ranexa  Take 1 tablet (1,000 mg total) by mouth 2 (two) times daily.         Allergies  Allergen Reactions   Sulfa Drugs Cross Reactors Rash    Past Medical History:  Diagnosis Date   Asthma    as a child, no problems as an adult, no inhaler   Coronary artery disease    Prior PCI to the prox LAD in 1991; S/P PCI to LCX in October 1991   Diabetes mellitus    TYPE 2   GERD (gastroesophageal reflux disease)    occasional - diet controlled   History of kidney stones    many yrs ago   Hyperlipidemia    Tobacco abuse    quit 09/2020 - smoked for 64 yrs    Past Surgical History:  Procedure Laterality Date   COLONOSCOPY WITH PROPOFOL  N/A 04/21/2016   Procedure: COLONOSCOPY WITH PROPOFOL ;  Surgeon: Garrett Kallman, MD;  Location: [facility];  Service: Endoscopy;  Laterality: N/A;   CORONARY ANGIOPLASTY  05/1990   PROXIMAL LAD   CORONARY ANGIOPLASTY  06/1990   LEFT CIRCUMFLEX CORONARY   CORONARY ARTERY BYPASS GRAFT N/A 12/26/2020   Procedure: CORONARY ARTERY BYPASS GRAFTING (CABG), ON PUMP, TIMES THREE, USING LEFT INTERNAL MAMMARY ARTERY AND ENDOSCOPICALLY HARVESTED RIGHT GREATER SAPHENOUS VEIN;  Surgeon: Zelphia Higashi, MD;  Location: [facility] OR;  Service: Open Heart Surgery;  Laterality: N/A;   CORONARY  STENT INTERVENTION N/A 07/17/2021   Procedure: CORONARY STENT INTERVENTION;  Surgeon: Swazil[place], Glenn Christo M, MD;  Location: [facility] INVASIVE CV LAB;  Service: Cardiovascular;  Laterality: N/A;   KIDNEY STONE SURGERY Bilateral    x 2 surgeries   LEFT HEART CATH AND CORONARY ANGIOGRAPHY N/A 11/18/2018   Procedure: LEFT HEART CATH AND CORONARY ANGIOGRAPHY;  Surgeon: [place], Katty Fretwell M, MD;  Location: [facility] INVASIVE CV LAB;  Service: Cardiovascular;  Laterality: N/A;   LEFT HEART CATH AND CORONARY ANGIOGRAPHY N/A 12/17/2020   Procedure: LEFT HEART CATH AND CORONARY ANGIOGRAPHY;  Surgeon: [place], Zyara Riling M, MD;  Location: [facility] INVASIVE CV LAB;  Service: Cardiovascular;  Laterality: N/A;   LEFT HEART CATH AND CORS/GRAFTS ANGIOGRAPHY N/A 07/17/2021   Procedure: LEFT HEART CATH AND CORS/GRAFTS ANGIOGRAPHY;  Surgeon: [place], Tirsa Gail M, MD;  Location: [facility] INVASIVE CV LAB;  Service: Cardiovascular;  Laterality: N/A;   LEFT HEART CATH AND CORS/GRAFTS ANGIOGRAPHY N/A 10/06/2022   Procedure: LEFT HEART CATH AND CORS/GRAFTS ANGIOGRAPHY;  Surgeon: [place], Cenia Zaragosa M, MD;  Location: [facility] INVASIVE CV LAB;  Service: Cardiovascular;  Laterality: N/A;   TEE WITHOUT CARDIOVERSION N/A 12/26/2020   Procedure: TRANSESOPHAGEAL ECHOCARDIOGRAM (TEE);  Surgeon: Zelphia Higashi, MD;  Location: [facility] OR;  Service: Open Heart Surgery;  Laterality: N/A;   UMBILICAL HERNIA REPAIR      Social History   Tobacco Use  Smoking Status Every Day   Current packs/day: 0.00   Average packs/day: 1 pack/day for 64.0 years (64.0 ttl pk-yrs)   Types: Cigarettes   Start date: 09/28/1956   Last attempt to quit: 09/28/2020   Years since quitting: 3.3  Smokeless Tobacco Never    Social History   Substance and Sexual Activity  Alcohol Use Yes   Alcohol/week: 3.0 - 4.0 standard drinks of alcohol   Types: 3 - 4 Standard drinks or equivalent per week   Comment: every other weekend - liquor    Family History  Problem Relation Age of Onset   Heart disease Sister         had open heart surgery    Review of Systems: As noted in history of present illness.  All other systems were reviewed and are negative.  Physical Exam: There were no vitals taken for this visit. GENERAL:  Well appearing WM in NAD HEENT:  PERRL, EOMI, sclera are clear. Oropharynx is clear. NECK:  No jugular venous distention, carotid upstroke brisk and symmetric, no bruits, no thyromegaly or adenopathy LUNGS:  Clear to auscultation bilaterally CHEST:  Incisions healing well.  HEART:  RRR,  PMI not displaced or sustained,S1 and S2 within normal limits, no S3, no S4: no clicks, no rubs, no murmurs ABD:  Soft, nontender. BS +, no masses or bruits. No hepatomegaly, no splenomegaly EXT:  2 + pulses throughout, no edema, no cyanosis no clubbing.  SKIN:  Warm and dry.  No rashes NEURO:  Alert and oriented x 3. Cranial nerves II through XII intact. PSYCH:  Cognitively intact   LABORATORY DATA:  Lab Results  Component Value Date   WBC 8.6 09/29/2022   HGB 12.4 (L) 09/29/2022   HCT 40.8 09/29/2022   PLT 235 09/29/2022   GLUCOSE 139 (H) 09/29/2022   CHOL 107 09/29/2022   TRIG 57 09/29/2022   HDL 49 09/29/2022   LDLCALC 45 09/29/2022   ALT 9 09/29/2022   AST 9 09/29/2022   NA 140 09/29/2022   K 4.8 09/29/2022   CL 104 09/29/2022   CREATININE 0.81 09/29/2022   BUN 13 09/29/2022   CO2 22 09/29/2022   TSH 2.030 06/19/2021   INR 1.0 09/29/2022   HGBA1C 7.2 (H) 09/29/2022   Labs dated 08/05/16: cholesterol 115, triglycerides 56, LDL 49, HDL 56. A1c 7.4%. CMET normal. Dated 08/30/17: cholesterol 120, triglycerides 58, HDL 46, LDL 62. A1c 7.8%. Chemistries and CBC normal.  Dated 09/16/18: cholesterol 106, triglycerides 51, HDL 48, LDL48. A1c 7.4%. CMET normal Dated 03/27/19: A1c 7.5%. BMET normal Dated 10/19/19: cholesterol 119, triglycerides 52, HDL 51, LDL 56, CMET normal Dated 03/01/53: A1c 6.8%.  Dated 10/10/20:  cholesterol 123, triglycerides 56, HDL 55, LDL 56. A1c 7.1%. CMET normal.   Dated 04/18/21: A1c 8.6% Dated 04/22/22: A1c 7.5%, CMET normal.    Ecg not done today     Myoview  11/09/18: Study Highlights     The left ventricular ejection fraction is normal (55-65%). Nuclear stress EF: 59%. Blood pressure demonstrated a normal response to exercise. There was no ST segment deviation noted during stress. No T wave inversion was noted during stress. Defect 1: There is a small defect of moderate severity present in the mid anterolateral, apical lateral and apex location. Findings consistent with ischemia. This is an intermediate risk study.   The anterolateral perfusion defect on the prior exam was interpreted as fixed, however on today's exam appears to have reversible ischemia. Wall motion appears abnormal in this region.     Cardiac cath 11/18/18:  LEFT HEART CATH AND CORONARY ANGIOGRAPHY  Conclusion     Prox LAD lesion is 45% stenosed. Prox LAD to Mid LAD lesion is 65% stenosed. Ost Cx to Prox Cx lesion is 50% stenosed. Prox Cx to Mid Cx lesion is 50% stenosed. Prox RCA to Dist RCA lesion is 100% stenosed. The left ventricular systolic function is normal. LV end diastolic pressure is normal. The left ventricular ejection fraction is 55-65% by visual estimate.   1. Single vessel occlusive CAD with CTO of the RCA. Left to right collaterals. There is diffuse moderate calcific disease involving the LAD and LCx that is not significantly changed since 2011. 2. Normal LV function 3. Normal LV EDP   Plan: continued medical therapy   Cardiac cath 12/17/20:  LEFT HEART CATH AND CORONARY ANGIOGRAPHY    Conclusion    Prox LAD lesion is 45% stenosed. Prox LAD to Mid LAD lesion is 75% stenosed. Ost Cx to Prox Cx lesion is 90% stenosed. Prox Cx to Mid Cx lesion is 60% stenosed. Prox RCA to Dist RCA lesion is 100% stenosed. Ost LM to Mid LM lesion is 30% stenosed. Ost LAD lesion is 80% stenosed. The left ventricular systolic function is normal. LV end  diastolic pressure is normal. The left ventricular ejection fraction is 55-65% by visual estimate.   1. Severe 3 vessel obstructive CAD. Patient has CTO of the RCA. Progressive ostial LAD and LCX disease. Severely calcified vessels 2. Normal LV function 3. Normal LVEDP   Plan: recommend referral to CT surgery for consideration of CABG.    Coronary Diagrams   Diagnostic Dominance: Right    Intervention   Intraop TEE 12/26/20: Complications: No known complications during this procedure.  POST-OP IMPRESSIONS  - Left Ventricle: The left ventricle is unchanged from pre-bypass.  - Right Ventricle: The right ventricle appears unchanged from pre-bypass.  - Aorta: The aorta appears unchanged from pre-bypass.  - Left Atrium: The left atrium appears unchanged from pre-bypass.  - Left Atrial Appendage: The left atrial appendage appears unchanged from  pre-bypass.  - Aortic Valve: The aortic valve appears unchanged from pre-bypass.  - Mitral Valve: The mitral valve appears unchanged from pre-bypass.  - Tricuspid Valve: The tricuspid valve appears unchanged from pre-bypass.  - Interatrial Septum: The interatrial septum appears unchanged from  pre-bypass.  - Interventricular Septum: The interventricular septum appears unchanged  from  pre-bypass.  - Pericardium: The pericardium appears unchanged from pre-bypass.   PRE-OP FINDINGS   Left Ventricle: The left ventricle has normal systolic function, with an  ejection fraction of 55-60%. The cavity size was normal. There is no left  ventricular  hypertrophy.    Right Ventricle: The right ventricle has normal systolic function. The  cavity was normal. There is no increase in right ventricular wall  thickness.   Left Atrium: Left atrial size was normal in size. No left atrial/left  atrial appendage thrombus was detected.   Right Atrium: Right atrial size was normal in size.   Interatrial Septum: No atrial level shunt detected by color  flow Doppler.   Pericardium: There is no evidence of pericardial effusion.   Mitral Valve: The mitral valve is normal in structure. Mitral valve  regurgitation is not visualized by color flow Doppler. There is No  evidence of mitral stenosis.   Tricuspid Valve: The tricuspid valve was normal in structure. Tricuspid  valve regurgitation was not visualized by color flow Doppler.   Aortic Valve: The aortic valve is tricuspid Aortic valve regurgitation was  not visualized by color flow Doppler. There is no stenosis of the aortic  valve.    Pulmonic Valve: The pulmonic valve was not assessed.  Pulmonic valve regurgitation was not assessed by color flow Doppler.       Hobart Lulas MD   Event monitor 03/04/21: Study Highlights    Patch Wear Time:  14 days and 0 hours (2022-05-18T15:15:55-0400 to 2022-06-01T15:15:59-0400)   Patient had a min HR of 49 bpm, max HR of 167 bpm, and avg HR of 67 bpm. Predominant underlying rhythm was Sinus Rhythm. 2 Ventricular Tachycardia runs occurred, the run with the fastest interval lasting 4 beats with a max rate of 156 bpm, the longest lasting 4 beats with an avg rate of 147 bpm. 50 Supraventricular Tachycardia runs occurred, the run with the fastest interval lasting 36.3 secs with a max rate of 167 bpm (avg 150 bpm); the run with the fastest interval was also the longest. Isolated SVEs were frequent (5.5%, 16109), SVE Couplets were rare (<1.0%, 3497), and SVE Triplets were rare (<1.0%, 910). Isolated VEs were rare (<1.0%, 1272), VE Triplets were rare (<1.0%, 3), and no VE Couplets were present. Ventricular Trigeminy was present.  Cardiac cath 07/17/21:  CORONARY STENT INTERVENTION  LEFT HEART CATH AND CORS/GRAFTS ANGIOGRAPHY   Conclusion      Ost LM to Mid LM lesion is 30% stenosed.   Prox LAD lesion is 45% stenosed.   Prox LAD to Mid LAD lesion is 75% stenosed.   Ost Cx to Prox Cx lesion is 90% stenosed.   Prox Cx to Mid Cx lesion is 60%  stenosed.   Prox RCA to Dist RCA lesion is 100% stenosed.   Ost LAD lesion is 70% stenosed.   Origin to Prox Graft lesion is 100% stenosed.   2nd Mrg lesion is 80% stenosed.   Scoring balloon angioplasty was performed using a BALLN WOLVERINE 2.25X10.   Post intervention, there is a 0% residual stenosis.   Origin lesion is 70% stenosed.   A drug-eluting stent was successfully placed using a SYNERGY XD 4.0X12.   Post intervention, there is a 0% residual stenosis.   LIMA graft was visualized by angiography and is normal in caliber.   SVG graft was visualized by angiography.   The graft exhibits no disease.   The left ventricular systolic function is normal.   LV end diastolic pressure is normal.   The left ventricular ejection fraction is 55-65% by visual estimate.   3 vessel obstructive CAD Patent LIMA to the LAD Occluded SVG to the first diagonal. The native diagonal has good flow SVG to the OM2 is  patent but has stenosis at the aorto-ostium and at the distal anastomosis. CTO of the RCA Good LV function Normal LVEDP Successful PCI of the SVG to OM2 with DES at the ostial SVG and scoring balloon angioplasty of the distal anastomosis.   Plan: DAPT with ASA and Plavix  for one year. Do not resume Eliquis . Anticipate same day DC. Coronary Diagrams  Diagnostic Dominance: Right Intervention  Implants    Cardiac cath 10/06/22:  LEFT HEART CATH AND CORS/GRAFTS ANGIOGRAPHY   Conclusion      Ost LM to Mid LM lesion is 30% stenosed.   Prox LAD lesion is 45% stenosed.   Ost LAD lesion is 70% stenosed.   Prox LAD to Mid LAD lesion is 75% stenosed.   Prox RCA to Dist RCA lesion is 100% stenosed.   Origin to Prox Graft lesion is 100% stenosed.   Ost Cx to Prox Cx lesion is 95% stenosed.   Prox Cx to Mid Cx lesion is 60% stenosed.   Non-stenotic Origin lesion was previously treated.   Non-stenotic 2nd Mrg lesion was previously treated.   LIMA and is normal in caliber.   SVG.   The  graft exhibits no disease.   The left ventricular systolic function is normal.   LV end diastolic pressure is normal.   The left ventricular ejection fraction is 55-65% by visual estimate.   Severe 3 vessel obstructive CAD Patent LIMA to the LAD Occluded SVG to the diagonal. Patient has good antegrade flow in the diagonal Patent SVG to OM including patent stent ostially and patent distal anastomosis.  Normal LV function Normal LVEDP   Plan: continue medical therapy. I do not see any good targets for PCI. The RCA is chronically occluded. It is small and severely calcified. It is not suitable for CTO PCI. The ostial and mid LCx is severe diseased but the OM has a widely patent graft so only the small distal LCx is compromised. Stenting the native LCx would require extensive atherectomy and stenting and would potentially compromise flow into the LAD and thus the first diagonal.    Assessment / Plan: 1. Coronary disease with remote angioplasty of the LAD and left circumflex coronary  in 1991. Chronic total occlusion of the right coronary. Repeat evaluation with cardiac cath in February 2020 was stable.   He presented in March with  unstable angina. Cardiac cath showed progressive multivessel CAD. He subsequently underwent CABG by Dr Luna Salinas with LIMA to the LAD, SVG to diagonal and SVG to OM. It was noted that the RCA branches were too small to graft. Repeat cardiac cath showed occlusion of SVG to diagonal. There was significant stenosis at the ostium of SVG to OM as well as stenosis at the distal anastomosis. Both successfully treated with PCI in October 2022.  LIMA to LAD patent. On DAPT with ASA and Plavix . He has recurrent angina despite optimal medical therapy. Repeat cath with no good PCI targets. OM graft is still patent. Symptoms are stable today. Continue Rx.   2. Hyperlipidemia. Continue on high-dose atorvastatin . LDL at goal 45  3. Diabetes mellitus type 2.  on metformin , Januvia,  Jardiance and glimiperide. Needs to eliminate sugar/candy intake. Per primary care.   4. Tobacco abuse. Encourage complete smoking cessation.  5. Post op Afib. Converted on amiodarone . Intolerant of amiodarone  due to GI side effects. Had recurrent Aflutter that converted spontaneously. Asymptomatic. No recurrence on event monitor.

## 2024-02-08 ENCOUNTER — Encounter: Payer: Self-pay | Admitting: Cardiology

## 2024-02-08 ENCOUNTER — Ambulatory Visit: Payer: Medicare Other | Attending: Cardiology | Admitting: Cardiology

## 2024-02-08 VITALS — BP 100/66 | HR 64 | Ht 64.0 in | Wt 131.0 lb

## 2024-02-08 DIAGNOSIS — I480 Paroxysmal atrial fibrillation: Secondary | ICD-10-CM | POA: Diagnosis not present

## 2024-02-08 DIAGNOSIS — Z951 Presence of aortocoronary bypass graft: Secondary | ICD-10-CM

## 2024-02-08 DIAGNOSIS — E785 Hyperlipidemia, unspecified: Secondary | ICD-10-CM

## 2024-02-08 DIAGNOSIS — Z720 Tobacco use: Secondary | ICD-10-CM

## 2024-02-08 DIAGNOSIS — I25118 Atherosclerotic heart disease of native coronary artery with other forms of angina pectoris: Secondary | ICD-10-CM | POA: Diagnosis not present

## 2024-02-08 NOTE — Patient Instructions (Signed)

## 2024-04-06 ENCOUNTER — Telehealth: Payer: Self-pay | Admitting: Cardiology

## 2024-04-06 MED ORDER — RANOLAZINE ER 1000 MG PO TB12: 1000.0000 mg | ORAL_TABLET | Freq: Two times a day (BID) | ORAL | 3 refills | Status: AC

## 2024-04-06 NOTE — Telephone Encounter (Signed)
*  STAT* If patient is at the pharmacy, call can be transferred to refill team.   1. Which medications need to be refilled? (please list name of each medication and dose if known) ranolazine  (RANEXA ) 1000 MG SR tablet   2. Which pharmacy/location (including street and city if local pharmacy) is medication to be sent to? [id number] - [place], [place] - [address] AT [facility] OF [phone]   3. Do they need a 30 day or 90 day supply? 90

## 2024-04-06 NOTE — Telephone Encounter (Signed)
 Pt's medication was sent to pt's pharmacy as requested. Confirmation received.

## 2024-04-17 ENCOUNTER — Other Ambulatory Visit: Payer: Self-pay | Admitting: Cardiology

## 2024-05-11 ENCOUNTER — Ambulatory Visit (INDEPENDENT_AMBULATORY_CARE_PROVIDER_SITE_OTHER)

## 2024-05-11 ENCOUNTER — Ambulatory Visit: Admitting: Podiatry

## 2024-05-11 ENCOUNTER — Encounter: Payer: Self-pay | Admitting: Podiatry

## 2024-05-11 DIAGNOSIS — S9032XA Contusion of left foot, initial encounter: Secondary | ICD-10-CM

## 2024-05-12 NOTE — Progress Notes (Signed)
 Subjective:   Patient ID: Benjamin [name], male   DOB: 78 y.o.   MRN: [id number]   HPI Patient presents stating that he has developed bruising across his lesser digits of the left foot and does not remember injury.  Concerned about possibility for bony injury   ROS      Objective:  Physical Exam  Neuro intact with slight diminishment of vascular status  with the patient found to have discoloration across the lesser digits left minimal discomfort with the patient having no other current pathology     Assessment:  Probability for low-grade trauma cannot rule out low-grade vascular disease but does not appear to have any claudication symptoms currently     Plan:  H&P reviewed trauma and reviewed discoloration of the digits and I have recommended that he monitor this and it should get better but if it should turn darker or any breaks in skin were to occur I want to see him back immediately  X-rays indicate no signs of bony condition fracture or other pathology

## 2024-06-06 ENCOUNTER — Other Ambulatory Visit: Payer: Self-pay | Admitting: Cardiology

## 2024-06-21 ENCOUNTER — Other Ambulatory Visit: Payer: Self-pay | Admitting: Cardiology

## 2024-07-05 ENCOUNTER — Other Ambulatory Visit: Payer: Self-pay | Admitting: Cardiology

## 2024-08-02 NOTE — Progress Notes (Signed)
 Benjamin [name] Date of Birth: 06/20/1946 Medical Record #[id number]  History of Present Illness: Benjamin [name] is seen  for follow up s/p cardiac cath. He has a history of coronary disease with prior angioplasty of the proximal LAD in 1991. He also had angioplasty of the circumflex at that time. He has known occlusion of the right coronary with collateral flow. Last Myoview  study in October 2015 showed a small anterolateral scar. No ischemia.    On a prior  visit he described 3 episodes of chest pain for which he took sl Ntg. Lasted 5-10 minutes. No clear triggers. One time he became diaphoretic.  He denies dyspnea or palpitation.  He had a Myoview  study showing anterior ischemia. This led to a cardiac cath in February 2020 showing occluded RCA and moderate nonobstructive disease in the LAD and LCx. Unchanged from prior study. Medical therapy recommended.   He was on maximal antianginal therapy with  aspirin , atenolol , and isosorbide , Ranexa . He presented in March with  unstable angina. Cardiac cath showed progressive multivessel CAD. He subsequently underwent CABG by Dr Kerrin with LIMA to the LAD, SVG to diagonal and SVG to OM. It was noted that the RCA branches were too small to graft. His post op course was fairly uneventful. He had Afib on post op day #3 but converted on amiodarone . He had post op anemia. When I saw him last we discontinued amiodarone  due to Nausea and poor appetite. When seen by Dr Kerrin on May 10 he was in Atrial flutter with rate 150. He was asymptomatic. Was seen in the Afib clinic and was back in NSR. Atenolol  dose was increased. Event monitor was placed showing a single episode of SVT lasting 36 seconds.   He was seen on September 22 by Dr Lavona for evaluation of exertional chest pain. He was started on Imdur  and is seen back to evaluate response. He states that this helped about a week but then his pain returned. This is described as burning mid sternal pain that  radiates to his left jaw and arm. It is his typical angina. It is relieved with rest and sl Ntg. It is associated with exertion- just walking to his mailbox.   He underwent cardiac cath on 07/17/21. This showed patent LIMA to the LAD. The SVG to the first diagonal was occluded but the diagonal had good antegrade flow. The SVG to OM showed aorto-ostial stenosis as well as stenosis at the distal anastomosis. The ostial lesion was successfully stented and the distal lesion was treated with a cutting balloon with good result. His anginal symptoms improved post PCI.    Seen in early 2024.  We performed a cardiac cath showing a patent LIMA to the LAD and patent SVG to OM. There was no change otherwise. The first diagonal had good flow. The RCA is chronically occluded. No good targets for PCI and medical therapy continued.   On follow up today he is doing ok. He still has some angina. Takes sl Ntg about once every other week.  No dyspnea, palpitations, edema. Still smoking some     Allergies as of 08/07/2024       Reactions   Sulfa Drugs Cross Reactors Rash        Medication List        Accurate as of August 07, 2024  8:48 AM. If you have any questions, ask your nurse or doctor.          STOP taking these medications  Dulcolax 5 MG EC tablet Generic drug: bisacodyl  Stopped by: Arihant Pennings       TAKE these medications    aspirin  EC 81 MG tablet Take 1 tablet (81 mg total) by mouth daily.   atenolol  25 MG tablet Commonly known as: TENORMIN  TAKE 1 TABLET BY MOUTH EVERY MORNING AND 1/2 TABLET IN THE EVENING   atorvastatin  80 MG tablet Commonly known as: LIPITOR  TAKE 1 TABLET(80 MG) BY MOUTH DAILY   clopidogrel  75 MG tablet Commonly known as: PLAVIX  TAKE 1 TABLET BY MOUTH EVERY DAY WITH BREAKFAST   docusate sodium  50 MG capsule Commonly known as: COLACE Take 50 mg by mouth at bedtime.   DULCOLAX PO Take 50 mg by mouth daily.   glimepiride  4 MG tablet Commonly  known as: AMARYL  Take 4 mg by mouth daily with breakfast.   isosorbide  mononitrate 120 MG 24 hr tablet Commonly known as: IMDUR  Take 1 tablet (120 mg total) by mouth daily.   Januvia 100 MG tablet Generic drug: sitaGLIPtin Take 100 mg by mouth in the morning.   Jardiance 10 MG Tabs tablet Generic drug: empagliflozin Take 10 mg by mouth daily.   JOCK ITCH EX Apply 1 Application topically daily.   metFORMIN  1000 MG tablet Commonly known as: GLUCOPHAGE  Take 1 tablet (1,000 mg total) by mouth 2 (two) times daily.   nitroGLYCERIN  0.4 MG SL tablet Commonly known as: Nitrostat  Place 1 tablet (0.4 mg total) under the tongue every 5 (five) minutes as needed for chest pain.   OneTouch Delica Plus Lancet33G Misc Apply 1 each topically daily.   OneTouch Verio test strip Generic drug: glucose blood 1 each as needed.   ranolazine  1000 MG SR tablet Commonly known as: Ranexa  Take 1 tablet (1,000 mg total) by mouth 2 (two) times daily.         Allergies  Allergen Reactions   Sulfa Drugs Cross Reactors Rash    Past Medical History:  Diagnosis Date   Asthma    as a child, no problems as an adult, no inhaler   Coronary artery disease    Prior PCI to the prox LAD in 1991; S/P PCI to LCX in October 1991   Diabetes mellitus    TYPE 2   GERD (gastroesophageal reflux disease)    occasional - diet controlled   History of kidney stones    many yrs ago   Hyperlipidemia    Tobacco abuse    quit 09/2020 - smoked for 64 yrs    Past Surgical History:  Procedure Laterality Date   COLONOSCOPY WITH PROPOFOL  N/A 04/21/2016   Procedure: COLONOSCOPY WITH PROPOFOL ;  Surgeon: Gladis MARLA Louder, MD;  Location: [facility];  Service: Endoscopy;  Laterality: N/A;   CORONARY ANGIOPLASTY  05/1990   PROXIMAL LAD   CORONARY ANGIOPLASTY  06/1990   LEFT CIRCUMFLEX CORONARY   CORONARY ARTERY BYPASS GRAFT N/A 12/26/2020   Procedure: CORONARY ARTERY BYPASS GRAFTING (CABG), ON PUMP, TIMES THREE,  USING LEFT INTERNAL MAMMARY ARTERY AND ENDOSCOPICALLY HARVESTED RIGHT GREATER SAPHENOUS VEIN;  Surgeon: Kerrin Elspeth BROCKS, MD;  Location: [facility] OR;  Service: Open Heart Surgery;  Laterality: N/A;   CORONARY STENT INTERVENTION N/A 07/17/2021   Procedure: CORONARY STENT INTERVENTION;  Surgeon: Timya Trimmer M, MD;  Location: [facility] INVASIVE CV LAB;  Service: Cardiovascular;  Laterality: N/A;   KIDNEY STONE SURGERY Bilateral    x 2 surgeries   LEFT HEART CATH AND CORONARY ANGIOGRAPHY N/A 11/18/2018   Procedure: LEFT HEART CATH AND CORONARY  ANGIOGRAPHY;  Surgeon: Irvin Lizama M, MD;  Location: [facility] INVASIVE CV LAB;  Service: Cardiovascular;  Laterality: N/A;   LEFT HEART CATH AND CORONARY ANGIOGRAPHY N/A 12/17/2020   Procedure: LEFT HEART CATH AND CORONARY ANGIOGRAPHY;  Surgeon: Beata Beason M, MD;  Location: [facility] INVASIVE CV LAB;  Service: Cardiovascular;  Laterality: N/A;   LEFT HEART CATH AND CORS/GRAFTS ANGIOGRAPHY N/A 07/17/2021   Procedure: LEFT HEART CATH AND CORS/GRAFTS ANGIOGRAPHY;  Surgeon: Janalyn Higby M, MD;  Location: [facility] INVASIVE CV LAB;  Service: Cardiovascular;  Laterality: N/A;   LEFT HEART CATH AND CORS/GRAFTS ANGIOGRAPHY N/A 10/06/2022   Procedure: LEFT HEART CATH AND CORS/GRAFTS ANGIOGRAPHY;  Surgeon: Cloys Vera M, MD;  Location: [facility] INVASIVE CV LAB;  Service: Cardiovascular;  Laterality: N/A;   TEE WITHOUT CARDIOVERSION N/A 12/26/2020   Procedure: TRANSESOPHAGEAL ECHOCARDIOGRAM (TEE);  Surgeon: Kerrin Elspeth BROCKS, MD;  Location: [facility] OR;  Service: Open Heart Surgery;  Laterality: N/A;   UMBILICAL HERNIA REPAIR      Social History   Tobacco Use  Smoking Status Every Day   Current packs/day: 0.00   Average packs/day: 1 pack/day for 64.0 years (64.0 ttl pk-yrs)   Types: Cigarettes   Start date: 09/28/1956   Last attempt to quit: 09/28/2020   Years since quitting: 3.8  Smokeless Tobacco Never    Social History   Substance and Sexual Activity  Alcohol Use Yes   Alcohol/week: 3.0 -  4.0 standard drinks of alcohol   Types: 3 - 4 Standard drinks or equivalent per week   Comment: every other weekend - liquor    Family History  Problem Relation Age of Onset   Heart disease Sister        had open heart surgery    Review of Systems: As noted in history of present illness.  All other systems were reviewed and are negative.  Physical Exam: BP (!) 125/56   Pulse (!) 51   Ht 5' 4 (1.626 m)   Wt 128 lb (58.1 kg)   SpO2 98%   BMI 21.97 kg/m  GENERAL:  Well appearing WM in NAD HEENT:  PERRL, EOMI, sclera are clear. Oropharynx is clear. NECK:  No jugular venous distention, carotid upstroke brisk and symmetric, no bruits, no thyromegaly or adenopathy LUNGS:  Clear to auscultation bilaterally CHEST:  Incisions healing well.  HEART:  RRR,  PMI not displaced or sustained,S1 and S2 within normal limits, no S3, no S4: no clicks, no rubs, no murmurs ABD:  Soft, nontender. BS +, no masses or bruits. No hepatomegaly, no splenomegaly EXT:  2 + pulses throughout, no edema, no cyanosis no clubbing.  SKIN:  Warm and dry.  No rashes NEURO:  Alert and oriented x 3. Cranial nerves II through XII intact. PSYCH:  Cognitively intact   LABORATORY DATA:  Lab Results  Component Value Date   WBC 8.6 09/29/2022   HGB 12.4 (L) 09/29/2022   HCT 40.8 09/29/2022   PLT 235 09/29/2022   GLUCOSE 139 (H) 09/29/2022   CHOL 107 09/29/2022   TRIG 57 09/29/2022   HDL 49 09/29/2022   LDLCALC 45 09/29/2022   ALT 9 09/29/2022   AST 9 09/29/2022   NA 140 09/29/2022   K 4.8 09/29/2022   CL 104 09/29/2022   CREATININE 0.81 09/29/2022   BUN 13 09/29/2022   CO2 22 09/29/2022   TSH 2.030 06/19/2021   INR 1.0 09/29/2022   HGBA1C 7.2 (H) 09/29/2022   Labs dated 08/05/16: cholesterol 115, triglycerides 56,  LDL 49, HDL 56. A1c 7.4%. CMET normal. Dated 08/30/17: cholesterol 120, triglycerides 58, HDL 46, LDL 62. A1c 7.8%. Chemistries and CBC normal.  Dated 09/16/18: cholesterol 106, triglycerides  51, HDL 48, LDL48. A1c 7.4%. CMET normal Dated 03/27/19: A1c 7.5%. BMET normal Dated 10/19/19: cholesterol 119, triglycerides 52, HDL 51, LDL 56, CMET normal Dated 2/85/78: A1c 6.8%.  Dated 10/10/20: cholesterol 123, triglycerides 56, HDL 55, LDL 56. A1c 7.1%. CMET normal.  Dated 04/18/21: A1c 8.6% Dated 04/22/22: A1c 7.5%, CMET normal.  Dated 11/04/23: A1c 7.3%. Hgb 11.8. CMET normal. Cholesterol 122, triglycerides 67, HDL 54, LDL 54.         Myoview  11/09/18: Study Highlights     The left ventricular ejection fraction is normal (55-65%). Nuclear stress EF: 59%. Blood pressure demonstrated a normal response to exercise. There was no ST segment deviation noted during stress. No T wave inversion was noted during stress. Defect 1: There is a small defect of moderate severity present in the mid anterolateral, apical lateral and apex location. Findings consistent with ischemia. This is an intermediate risk study.   The anterolateral perfusion defect on the prior exam was interpreted as fixed, however on today's exam appears to have reversible ischemia. Wall motion appears abnormal in this region.     Cardiac cath 11/18/18:  LEFT HEART CATH AND CORONARY ANGIOGRAPHY  Conclusion     Prox LAD lesion is 45% stenosed. Prox LAD to Mid LAD lesion is 65% stenosed. Ost Cx to Prox Cx lesion is 50% stenosed. Prox Cx to Mid Cx lesion is 50% stenosed. Prox RCA to Dist RCA lesion is 100% stenosed. The left ventricular systolic function is normal. LV end diastolic pressure is normal. The left ventricular ejection fraction is 55-65% by visual estimate.   1. Single vessel occlusive CAD with CTO of the RCA. Left to right collaterals. There is diffuse moderate calcific disease involving the LAD and LCx that is not significantly changed since 2011. 2. Normal LV function 3. Normal LV EDP   Plan: continued medical therapy   Cardiac cath 12/17/20:  LEFT HEART CATH AND CORONARY ANGIOGRAPHY     Conclusion    Prox LAD lesion is 45% stenosed. Prox LAD to Mid LAD lesion is 75% stenosed. Ost Cx to Prox Cx lesion is 90% stenosed. Prox Cx to Mid Cx lesion is 60% stenosed. Prox RCA to Dist RCA lesion is 100% stenosed. Ost LM to Mid LM lesion is 30% stenosed. Ost LAD lesion is 80% stenosed. The left ventricular systolic function is normal. LV end diastolic pressure is normal. The left ventricular ejection fraction is 55-65% by visual estimate.   1. Severe 3 vessel obstructive CAD. Patient has CTO of the RCA. Progressive ostial LAD and LCX disease. Severely calcified vessels 2. Normal LV function 3. Normal LVEDP   Plan: recommend referral to CT surgery for consideration of CABG.    Coronary Diagrams   Diagnostic Dominance: Right    Intervention   Intraop TEE 12/26/20: Complications: No known complications during this procedure.  POST-OP IMPRESSIONS  - Left Ventricle: The left ventricle is unchanged from pre-bypass.  - Right Ventricle: The right ventricle appears unchanged from pre-bypass.  - Aorta: The aorta appears unchanged from pre-bypass.  - Left Atrium: The left atrium appears unchanged from pre-bypass.  - Left Atrial Appendage: The left atrial appendage appears unchanged from  pre-bypass.  - Aortic Valve: The aortic valve appears unchanged from pre-bypass.  - Mitral Valve: The mitral valve appears unchanged from pre-bypass.  -  Tricuspid Valve: The tricuspid valve appears unchanged from pre-bypass.  - Interatrial Septum: The interatrial septum appears unchanged from  pre-bypass.  - Interventricular Septum: The interventricular septum appears unchanged  from  pre-bypass.  - Pericardium: The pericardium appears unchanged from pre-bypass.   PRE-OP FINDINGS   Left Ventricle: The left ventricle has normal systolic function, with an  ejection fraction of 55-60%. The cavity size was normal. There is no left  ventricular hypertrophy.    Right Ventricle: The  right ventricle has normal systolic function. The  cavity was normal. There is no increase in right ventricular wall  thickness.   Left Atrium: Left atrial size was normal in size. No left atrial/left  atrial appendage thrombus was detected.   Right Atrium: Right atrial size was normal in size.   Interatrial Septum: No atrial level shunt detected by color flow Doppler.   Pericardium: There is no evidence of pericardial effusion.   Mitral Valve: The mitral valve is normal in structure. Mitral valve  regurgitation is not visualized by color flow Doppler. There is No  evidence of mitral stenosis.   Tricuspid Valve: The tricuspid valve was normal in structure. Tricuspid  valve regurgitation was not visualized by color flow Doppler.   Aortic Valve: The aortic valve is tricuspid Aortic valve regurgitation was  not visualized by color flow Doppler. There is no stenosis of the aortic  valve.    Pulmonic Valve: The pulmonic valve was not assessed.  Pulmonic valve regurgitation was not assessed by color flow Doppler.       Debby Like MD   Event monitor 03/04/21: Study Highlights    Patch Wear Time:  14 days and 0 hours (2022-05-18T15:15:55-0400 to 2022-06-01T15:15:59-0400)   Patient had a min HR of 49 bpm, max HR of 167 bpm, and avg HR of 67 bpm. Predominant underlying rhythm was Sinus Rhythm. 2 Ventricular Tachycardia runs occurred, the run with the fastest interval lasting 4 beats with a max rate of 156 bpm, the longest lasting 4 beats with an avg rate of 147 bpm. 50 Supraventricular Tachycardia runs occurred, the run with the fastest interval lasting 36.3 secs with a max rate of 167 bpm (avg 150 bpm); the run with the fastest interval was also the longest. Isolated SVEs were frequent (5.5%, 27317), SVE Couplets were rare (<1.0%, 3497), and SVE Triplets were rare (<1.0%, 910). Isolated VEs were rare (<1.0%, 1272), VE Triplets were rare (<1.0%, 3), and no VE Couplets were present.  Ventricular Trigeminy was present.  Cardiac cath 07/17/21:  CORONARY STENT INTERVENTION  LEFT HEART CATH AND CORS/GRAFTS ANGIOGRAPHY   Conclusion      Ost LM to Mid LM lesion is 30% stenosed.   Prox LAD lesion is 45% stenosed.   Prox LAD to Mid LAD lesion is 75% stenosed.   Ost Cx to Prox Cx lesion is 90% stenosed.   Prox Cx to Mid Cx lesion is 60% stenosed.   Prox RCA to Dist RCA lesion is 100% stenosed.   Ost LAD lesion is 70% stenosed.   Origin to Prox Graft lesion is 100% stenosed.   2nd Mrg lesion is 80% stenosed.   Scoring balloon angioplasty was performed using a BALLN WOLVERINE 2.25X10.   Post intervention, there is a 0% residual stenosis.   Origin lesion is 70% stenosed.   A drug-eluting stent was successfully placed using a SYNERGY XD 4.0X12.   Post intervention, there is a 0% residual stenosis.   LIMA graft was visualized by angiography and  is normal in caliber.   SVG graft was visualized by angiography.   The graft exhibits no disease.   The left ventricular systolic function is normal.   LV end diastolic pressure is normal.   The left ventricular ejection fraction is 55-65% by visual estimate.   3 vessel obstructive CAD Patent LIMA to the LAD Occluded SVG to the first diagonal. The native diagonal has good flow SVG to the OM2 is patent but has stenosis at the aorto-ostium and at the distal anastomosis. CTO of the RCA Good LV function Normal LVEDP Successful PCI of the SVG to OM2 with DES at the ostial SVG and scoring balloon angioplasty of the distal anastomosis.   Plan: DAPT with ASA and Plavix  for one year. Do not resume Eliquis . Anticipate same day DC. Coronary Diagrams  Diagnostic Dominance: Right Intervention  Implants    Cardiac cath 10/06/22:  LEFT HEART CATH AND CORS/GRAFTS ANGIOGRAPHY   Conclusion      Ost LM to Mid LM lesion is 30% stenosed.   Prox LAD lesion is 45% stenosed.   Ost LAD lesion is 70% stenosed.   Prox LAD to Mid LAD lesion is  75% stenosed.   Prox RCA to Dist RCA lesion is 100% stenosed.   Origin to Prox Graft lesion is 100% stenosed.   Ost Cx to Prox Cx lesion is 95% stenosed.   Prox Cx to Mid Cx lesion is 60% stenosed.   Non-stenotic Origin lesion was previously treated.   Non-stenotic 2nd Mrg lesion was previously treated.   LIMA and is normal in caliber.   SVG.   The graft exhibits no disease.   The left ventricular systolic function is normal.   LV end diastolic pressure is normal.   The left ventricular ejection fraction is 55-65% by visual estimate.   Severe 3 vessel obstructive CAD Patent LIMA to the LAD Occluded SVG to the diagonal. Patient has good antegrade flow in the diagonal Patent SVG to OM including patent stent ostially and patent distal anastomosis.  Normal LV function Normal LVEDP   Plan: continue medical therapy. I do not see any good targets for PCI. The RCA is chronically occluded. It is small and severely calcified. It is not suitable for CTO PCI. The ostial and mid LCx is severe diseased but the OM has a widely patent graft so only the small distal LCx is compromised. Stenting the native LCx would require extensive atherectomy and stenting and would potentially compromise flow into the LAD and thus the first diagonal.    Assessment / Plan: 1. Coronary disease with remote angioplasty of the LAD and left circumflex coronary  in 1991. Chronic total occlusion of the right coronary. Repeat evaluation with cardiac cath in February 2020 was stable.   He presented in March with  unstable angina. Cardiac cath showed progressive multivessel CAD. He subsequently underwent CABG by Dr Kerrin with LIMA to the LAD, SVG to diagonal and SVG to OM. It was noted that the RCA branches were too small to graft. Repeat cardiac cath showed occlusion of SVG to diagonal. There was significant stenosis at the ostium of SVG to OM as well as stenosis at the distal anastomosis. Both successfully treated with PCI in  October 2022.  LIMA to LAD patent. On DAPT with ASA and Plavix .  Repeat cath in Jan 2024 with no good PCI targets. OM graft is still patent. Symptoms are stable today. Continue Rx.   2. Hyperlipidemia. Continue on high-dose atorvastatin . LDL at  goal 54  3. Diabetes mellitus type 2.  on metformin , Januvia, Jardiance and glimiperide. Needs to eliminate sugar/candy intake. Per primary care.   4. Tobacco abuse. Encourage complete smoking cessation.  5. Post op Afib. Converted on amiodarone . Intolerant of amiodarone  due to GI side effects. Had recurrent Aflutter that converted spontaneously. Asymptomatic. No recurrence on event monitor.

## 2024-08-07 ENCOUNTER — Encounter: Payer: Self-pay | Admitting: Cardiology

## 2024-08-07 ENCOUNTER — Ambulatory Visit: Attending: Cardiology | Admitting: Cardiology

## 2024-08-07 VITALS — BP 125/56 | HR 51 | Ht 64.0 in | Wt 128.0 lb

## 2024-08-07 DIAGNOSIS — E118 Type 2 diabetes mellitus with unspecified complications: Secondary | ICD-10-CM

## 2024-08-07 DIAGNOSIS — E7800 Pure hypercholesterolemia, unspecified: Secondary | ICD-10-CM | POA: Diagnosis not present

## 2024-08-07 DIAGNOSIS — Z951 Presence of aortocoronary bypass graft: Secondary | ICD-10-CM

## 2024-08-07 DIAGNOSIS — Z720 Tobacco use: Secondary | ICD-10-CM | POA: Diagnosis not present

## 2024-08-07 DIAGNOSIS — I25118 Atherosclerotic heart disease of native coronary artery with other forms of angina pectoris: Secondary | ICD-10-CM | POA: Diagnosis not present

## 2024-08-07 NOTE — Patient Instructions (Signed)
 Medication Instructions:  Continue same medications *If you need a refill on your cardiac medications before your next appointment, please call your pharmacy*  Lab Work: None ordered  Testing/Procedures: None ordered  Follow-Up: At [facility], you and your health needs are our priority.  As part of our continuing mission to provide you with exceptional heart care, our providers are all part of one team.  This team includes your primary Cardiologist (physician) and Advanced Practice Providers or APPs (Physician Assistants and Nurse Practitioners) who all work together to provide you with the care you need, when you need it.  Your next appointment:  6 months    Call in Feb to schedule May appointment     Provider:  Dr.Jordan   We recommend signing up for the patient portal called MyChart.  Sign up information is provided on this After Visit Summary.  MyChart is used to connect with patients for Virtual Visits (Telemedicine).  Patients are able to view lab/test results, encounter notes, upcoming appointments, etc.  Non-urgent messages can be sent to your provider as well.   To learn more about what you can do with MyChart, go to [email or web address].

## 2024-09-25 ENCOUNTER — Other Ambulatory Visit: Payer: Self-pay | Admitting: Cardiology

## 2025-02-05 ENCOUNTER — Ambulatory Visit: Admitting: Cardiology
